# Patient Record
Sex: Male | Born: 1960 | Race: White | Hispanic: No | Smoking: Former smoker
Health system: Southern US, Community
[De-identification: ages and names within clinical notes are randomized; demographics above are authoritative.]

## PROBLEM LIST (undated history)

## (undated) VITALS — BP 104/87 | HR 87 | Temp 98.2°F | Resp 19 | Ht 70.0 in | Wt 239.0 lb

## (undated) VITALS — BP 162/94 | HR 86 | Temp 98.2°F | Resp 18 | Ht 70.0 in | Wt 275.0 lb

## (undated) DIAGNOSIS — I2510 Atherosclerotic heart disease of native coronary artery without angina pectoris: Secondary | ICD-10-CM

## (undated) DIAGNOSIS — I10 Essential (primary) hypertension: Secondary | ICD-10-CM

## (undated) DIAGNOSIS — M1990 Unspecified osteoarthritis, unspecified site: Secondary | ICD-10-CM

## (undated) DIAGNOSIS — E785 Hyperlipidemia, unspecified: Secondary | ICD-10-CM

## (undated) DIAGNOSIS — E119 Type 2 diabetes mellitus without complications: Secondary | ICD-10-CM

## (undated) DIAGNOSIS — F4310 Post-traumatic stress disorder, unspecified: Secondary | ICD-10-CM

## (undated) DIAGNOSIS — F17200 Nicotine dependence, unspecified, uncomplicated: Secondary | ICD-10-CM

## (undated) DIAGNOSIS — F329 Major depressive disorder, single episode, unspecified: Secondary | ICD-10-CM

## (undated) DIAGNOSIS — E1165 Type 2 diabetes mellitus with hyperglycemia: Secondary | ICD-10-CM

## (undated) DIAGNOSIS — K859 Acute pancreatitis without necrosis or infection, unspecified: Secondary | ICD-10-CM

## (undated) HISTORY — DX: Type 2 diabetes mellitus with hyperglycemia: E11.65

## (undated) HISTORY — DX: Major depressive disorder, single episode, unspecified: F32.9

## (undated) HISTORY — DX: Essential (primary) hypertension: I10

## (undated) HISTORY — DX: Nicotine dependence, unspecified, uncomplicated: F17.200

## (undated) HISTORY — DX: Hyperlipidemia, unspecified: E78.5

## (undated) HISTORY — PX: CHOLECYSTECTOMY: SHX55

## (undated) HISTORY — PX: WRIST FRACTURE SURGERY: SHX121

---
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
## 2015-08-03 ENCOUNTER — Emergency Department (HOSPITAL_COMMUNITY): Payer: Non-veteran care

## 2015-08-03 ENCOUNTER — Observation Stay (HOSPITAL_COMMUNITY): Payer: Non-veteran care

## 2015-08-03 ENCOUNTER — Encounter (HOSPITAL_COMMUNITY): Payer: Self-pay | Admitting: Oncology

## 2015-08-03 ENCOUNTER — Inpatient Hospital Stay (HOSPITAL_COMMUNITY)
Admission: EM | Admit: 2015-08-03 | Discharge: 2015-08-05 | DRG: 440 | Disposition: A | Discharge status: Home / Self Care | Payer: Non-veteran care | Attending: Internal Medicine | Admitting: Internal Medicine

## 2015-08-03 DIAGNOSIS — F4310 Post-traumatic stress disorder, unspecified: Secondary | ICD-10-CM | POA: Diagnosis present

## 2015-08-03 DIAGNOSIS — E669 Obesity, unspecified: Secondary | ICD-10-CM

## 2015-08-03 DIAGNOSIS — F17200 Nicotine dependence, unspecified, uncomplicated: Secondary | ICD-10-CM | POA: Diagnosis present

## 2015-08-03 DIAGNOSIS — M1990 Unspecified osteoarthritis, unspecified site: Secondary | ICD-10-CM | POA: Diagnosis present

## 2015-08-03 DIAGNOSIS — R9431 Abnormal electrocardiogram [ECG] [EKG]: Secondary | ICD-10-CM | POA: Diagnosis present

## 2015-08-03 DIAGNOSIS — E1169 Type 2 diabetes mellitus with other specified complication: Secondary | ICD-10-CM

## 2015-08-03 DIAGNOSIS — R112 Nausea with vomiting, unspecified: Secondary | ICD-10-CM | POA: Diagnosis present

## 2015-08-03 DIAGNOSIS — I10 Essential (primary) hypertension: Secondary | ICD-10-CM | POA: Diagnosis present

## 2015-08-03 DIAGNOSIS — I714 Abdominal aortic aneurysm, without rupture, unspecified: Secondary | ICD-10-CM | POA: Diagnosis present

## 2015-08-03 DIAGNOSIS — Z720 Tobacco use: Secondary | ICD-10-CM

## 2015-08-03 DIAGNOSIS — E785 Hyperlipidemia, unspecified: Secondary | ICD-10-CM | POA: Diagnosis present

## 2015-08-03 DIAGNOSIS — K859 Acute pancreatitis without necrosis or infection, unspecified: Secondary | ICD-10-CM | POA: Diagnosis present

## 2015-08-03 DIAGNOSIS — I513 Intracardiac thrombosis, not elsewhere classified: Secondary | ICD-10-CM | POA: Diagnosis present

## 2015-08-03 DIAGNOSIS — Z79899 Other long term (current) drug therapy: Secondary | ICD-10-CM | POA: Diagnosis not present

## 2015-08-03 DIAGNOSIS — E119 Type 2 diabetes mellitus without complications: Secondary | ICD-10-CM

## 2015-08-03 DIAGNOSIS — E1165 Type 2 diabetes mellitus with hyperglycemia: Secondary | ICD-10-CM | POA: Diagnosis present

## 2015-08-03 DIAGNOSIS — R0602 Shortness of breath: Secondary | ICD-10-CM

## 2015-08-03 HISTORY — DX: Essential (primary) hypertension: I10

## 2015-08-03 HISTORY — DX: Post-traumatic stress disorder, unspecified: F43.10

## 2015-08-03 HISTORY — DX: Unspecified osteoarthritis, unspecified site: M19.90

## 2015-08-03 HISTORY — DX: Type 2 diabetes mellitus without complications: E11.9

## 2015-08-03 LAB — COMPREHENSIVE METABOLIC PANEL
ALT: 32 U/L (ref 17–63)
AST: 21 U/L (ref 15–41)
Albumin: 4.3 g/dL (ref 3.5–5.0)
Alkaline Phosphatase: 82 U/L (ref 38–126)
Anion gap: 11 (ref 5–15)
BUN: 17 mg/dL (ref 6–20)
CALCIUM: 9.8 mg/dL (ref 8.9–10.3)
CO2: 25 mmol/L (ref 22–32)
CREATININE: 0.81 mg/dL (ref 0.61–1.24)
Chloride: 99 mmol/L — ABNORMAL LOW (ref 101–111)
GFR calc Af Amer: >60 mL/min (ref >60)
Glucose, Bld: 302 mg/dL — ABNORMAL HIGH (ref 65–99)
Potassium: 3.9 mmol/L (ref 3.5–5.1)
Sodium: 135 mmol/L (ref 135–145)
Total Bilirubin: 0.6 mg/dL (ref 0.3–1.2)
Total Protein: 8.1 g/dL (ref 6.5–8.1)

## 2015-08-03 LAB — GLUCOSE, CAPILLARY
GLUCOSE-CAPILLARY: 122 mg/dL — AB (ref 65–99)
GLUCOSE-CAPILLARY: 113 mg/dL — AB (ref 65–99)
GLUCOSE-CAPILLARY: 110 mg/dL — AB (ref 65–99)
Glucose-Capillary: 155 mg/dL — ABNORMAL HIGH (ref 65–99)
Glucose-Capillary: 97 mg/dL (ref 65–99)

## 2015-08-03 LAB — CBC WITH DIFFERENTIAL/PLATELET
BASOS ABS: 0 10*3/uL (ref 0.0–0.1)
Basophils Relative: 0 %
EOS ABS: 0.2 10*3/uL (ref 0.0–0.7)
EOS PCT: 1 %
HCT: 46.6 % (ref 39.0–52.0)
Hemoglobin: 16.1 g/dL (ref 13.0–17.0)
LYMPHS PCT: 25 %
Lymphs Abs: 4 10*3/uL (ref 0.7–4.0)
MCH: 30.1 pg (ref 26.0–34.0)
MCHC: 34.5 g/dL (ref 30.0–36.0)
MCV: 87.3 fL (ref 78.0–100.0)
MONO ABS: 1.2 10*3/uL — AB (ref 0.1–1.0)
Monocytes Relative: 8 %
Neutro Abs: 10.3 10*3/uL — ABNORMAL HIGH (ref 1.7–7.7)
Neutrophils Relative %: 66 %
Platelets: 234 10*3/uL (ref 150–400)
RBC: 5.34 MIL/uL (ref 4.22–5.81)
RDW: 13.6 % (ref 11.5–15.5)
WBC: 15.7 10*3/uL — AB (ref 4.0–10.5)

## 2015-08-03 LAB — URINALYSIS, ROUTINE W REFLEX MICROSCOPIC
BILIRUBIN URINE: NEGATIVE
KETONES UR: NEGATIVE mg/dL
Leukocytes, UA: NEGATIVE
Nitrite: NEGATIVE
PH: 5.5 (ref 5.0–8.0)
Protein, ur: 100 mg/dL — AB
Specific Gravity, Urine: 1.043 — ABNORMAL HIGH (ref 1.005–1.030)

## 2015-08-03 LAB — LIPID PANEL
CHOL/HDL RATIO: 5.7 ratio
CHOLESTEROL: 211 mg/dL — AB (ref 0–200)
HDL: 37 mg/dL — AB (ref >40)
LDL Cholesterol: 105 mg/dL — ABNORMAL HIGH (ref 0–99)
TRIGLYCERIDES: 343 mg/dL — AB (ref <150)
VLDL: 69 mg/dL — AB (ref 0–40)

## 2015-08-03 LAB — URINE MICROSCOPIC-ADD ON

## 2015-08-03 LAB — LIPASE, BLOOD: Lipase: 292 U/L — ABNORMAL HIGH (ref 11–51)

## 2015-08-03 LAB — TROPONIN I

## 2015-08-03 MED ORDER — ONDANSETRON HCL 4 MG/2ML IJ SOLN
4.0000 mg | Freq: Once | INTRAMUSCULAR | Status: AC
  Administered 2015-08-03: 4 mg via INTRAVENOUS
  Filled 2015-08-03: qty 2

## 2015-08-03 MED ORDER — INSULIN ASPART 100 UNIT/ML ~~LOC~~ SOLN
0.0000 [IU] | SUBCUTANEOUS | Status: DC
  Administered 2015-08-03: 1 [IU] via SUBCUTANEOUS
  Administered 2015-08-03: 2 [IU] via SUBCUTANEOUS
  Administered 2015-08-04 – 2015-08-05 (×2): 1 [IU] via SUBCUTANEOUS

## 2015-08-03 MED ORDER — ONDANSETRON HCL 4 MG/2ML IJ SOLN: 4.0000 mg | Freq: Three times a day (TID) | INTRAMUSCULAR | Status: AC | PRN

## 2015-08-03 MED ORDER — IOHEXOL 300 MG/ML  SOLN
100.0000 mL | Freq: Once | INTRAMUSCULAR | Status: AC | PRN
Start: 2015-08-03 — End: 2015-08-03
  Administered 2015-08-03: 100 mL via INTRAVENOUS

## 2015-08-03 MED ORDER — HYDROMORPHONE HCL 1 MG/ML IJ SOLN
1.0000 mg | Freq: Once | INTRAMUSCULAR | Status: AC
  Administered 2015-08-03: 1 mg via INTRAVENOUS
  Filled 2015-08-03: qty 1

## 2015-08-03 MED ORDER — SODIUM CHLORIDE 0.9 % IV BOLUS (SEPSIS)
1000.0000 mL | Freq: Once | INTRAVENOUS | Status: AC
  Administered 2015-08-03: 1000 mL via INTRAVENOUS

## 2015-08-03 MED ORDER — HYDROMORPHONE HCL 1 MG/ML IJ SOLN
1.0000 mg | INTRAMUSCULAR | Status: AC | PRN
  Administered 2015-08-03 – 2015-08-04 (×11): 1 mg via INTRAVENOUS
  Filled 2015-08-03 (×10): qty 1

## 2015-08-03 MED ORDER — ATORVASTATIN CALCIUM 40 MG PO TABS
40.0000 mg | ORAL_TABLET | Freq: Every day | ORAL | Status: DC
  Administered 2015-08-04: 40 mg via ORAL
  Filled 2015-08-03: qty 1

## 2015-08-03 MED ORDER — VENLAFAXINE HCL 75 MG PO TABS
75.0000 mg | ORAL_TABLET | Freq: Two times a day (BID) | ORAL | Status: DC
  Administered 2015-08-03 – 2015-08-05 (×4): 75 mg via ORAL
  Filled 2015-08-03 (×5): qty 1

## 2015-08-03 MED ORDER — NICOTINE 21 MG/24HR TD PT24
21.0000 mg | MEDICATED_PATCH | Freq: Every day | TRANSDERMAL | Status: DC
  Administered 2015-08-03 – 2015-08-05 (×3): 21 mg via TRANSDERMAL
  Filled 2015-08-03 (×3): qty 1

## 2015-08-03 MED ORDER — TRAZODONE HCL 50 MG PO TABS
200.0000 mg | ORAL_TABLET | Freq: Every day | ORAL | Status: DC
  Filled 2015-08-03: qty 4

## 2015-08-03 MED ORDER — ENOXAPARIN SODIUM 40 MG/0.4ML ~~LOC~~ SOLN
40.0000 mg | SUBCUTANEOUS | Status: DC
  Administered 2015-08-03 – 2015-08-05 (×3): 40 mg via SUBCUTANEOUS
  Filled 2015-08-03 (×4): qty 0.4

## 2015-08-03 MED ORDER — IOHEXOL 300 MG/ML  SOLN
50.0000 mL | Freq: Once | INTRAMUSCULAR | Status: AC | PRN
  Administered 2015-08-03: 50 mL via ORAL

## 2015-08-03 MED ORDER — HYDROMORPHONE HCL 1 MG/ML IJ SOLN
1.0000 mg | INTRAMUSCULAR | Status: DC | PRN
Start: 2015-08-03 — End: 2015-08-03
  Administered 2015-08-03: 1 mg via INTRAVENOUS
  Filled 2015-08-03 (×2): qty 1

## 2015-08-03 MED ORDER — SODIUM CHLORIDE 0.9 % IV SOLN
INTRAVENOUS | Status: DC
  Administered 2015-08-03 – 2015-08-04 (×4): via INTRAVENOUS

## 2015-08-03 MED ORDER — IPRATROPIUM-ALBUTEROL 0.5-2.5 (3) MG/3ML IN SOLN: 3.0000 mL | RESPIRATORY_TRACT | Status: DC | PRN

## 2015-08-03 MED ORDER — SODIUM CHLORIDE 0.9 % IV SOLN: INTRAVENOUS | Status: AC

## 2015-08-03 MED ORDER — ASPIRIN EC 81 MG PO TBEC
81.0000 mg | DELAYED_RELEASE_TABLET | Freq: Every day | ORAL | Status: DC
  Administered 2015-08-04 – 2015-08-05 (×2): 81 mg via ORAL
  Filled 2015-08-03 (×3): qty 1

## 2015-08-03 NOTE — Progress Notes (Signed)
Patient seen and examined this morning, agree with H&P. He is a 55 yo M who is admitted with acute pancreatitis. He continues to have abdominal pain this morning, mid epigastric to left upper quadrant, irradiating to the back.    Acute pancreatitis - Etiology of his pancreatitis is not entirely clear, he is not drinking alcohol, not on any new medications or supplements. He has a history of cholecystectomy.  - lipid panel pending - NPO, IVF, supportive management - patient appears anxious to go home as soon as possible  Hyperglycemia - history of DM however patient states that he doesn't have DM - A1C pending - meanwhile maintain on SSI  HTN - normotensive, getting narcotics for pain control, hold home medications, resume as indicated  AAA with mild intramural thrombus - incidentally, CT abdomen pelvis with dilatation of the infrarenal abdominal aorta to 3.8 cm in AP dimension with mild mural thrombus without evidence of luminal narrowing.  - start patient on Aspirin and Statin, will need follow up with vascular surgery as an outpatient for routine monitoring. - discussed with Dr. Darrick PennaFields over the phone, no role for anticoagulation  Tobacco abuse - counseled for cessation - nicotine patch  PTSD (post-traumatic stress disorder) - Patient currently on trazodone and venlafaxine   Costin M. Elvera LennoxGherghe, MD [facility] [phone]

## 2015-08-03 NOTE — ED Provider Notes (Signed)
CSN: [id number]     Arrival date & time 08/03/15  0229 History  By signing my name below, I, Jacob Brady, attest that this documentation has been prepared under the direction and in the presence of [name], MD. Electronically Signed: [name], ED Scribe. 08/03/2015. 3:01 AM     Chief Complaint  Patient presents with  . Abdominal Pain   The history is provided by the patient. No language interpreter was used.   Brought in by EMS, Jacob Brady is a 55 y.o. male with history of gallstones s/p cholecystectomy and pancreatitis who presents to the Emergency Department complaining of new, worsening, throbbing/stinging/aching, left upper abdominal pain with gradual onset 10 days ago The pain radiates to the back and is worse with eating. Associated symptoms include decreased appetite and nausea. He notes some dry heaving closer to the onset of symptoms but denies vomiting. Pt denies chest pain. He has no history of ulcers. Past Medical History  Diagnosis Date  . Hypertension   . Diabetes mellitus without complication (HCC)   . PTSD (post-traumatic stress disorder)   . Arthritis    Past Surgical History  Procedure Laterality Date  . Cholecystectomy    . Wrist fracture surgery Right    History reviewed. No pertinent family history. Social History  Substance Use Topics  . Smoking status: Current Every Day Smoker  . Smokeless tobacco: Never Used  . Alcohol Use: No    Review of Systems  Constitutional: Positive for appetite change. Negative for fever and chills.  Cardiovascular: Negative for chest pain.  Gastrointestinal: Positive for nausea and abdominal pain. Negative for vomiting, hematochezia and hematemesis.  All other systems reviewed and are negative.  Allergies  Review of patient's allergies indicates no known allergies.  Home Medications   Prior to Admission medications   Medication Sig Start Date End Date Taking? Authorizing Provider   amLODipine-benazepril (LOTREL) 10-20 MG capsule Take 1 capsule by mouth daily.   Yes Historical Provider, MD  hydrochlorothiazide (HYDRODIURIL) 12.5 MG tablet Take 12.5 mg by mouth daily.   Yes Historical Provider, MD  ibuprofen (ADVIL,MOTRIN) 200 MG tablet Take 200 mg by mouth every 6 (six) hours as needed.   Yes Historical Provider, MD  traZODone (DESYREL) 100 MG tablet Take 200 mg by mouth at bedtime.   Yes Historical Provider, MD  venlafaxine (EFFEXOR) 75 MG tablet Take 75 mg by mouth 2 (two) times daily.   Yes Historical Provider, MD   BP 149/90 mmHg  Pulse 99  Temp(Src) 98.2 F (36.8 C) (Oral)  Resp 20  Ht 5\' 11"  (1.803 m)  Wt 210 lb (95.255 kg)  BMI 29.30 kg/m2  SpO2 96% Physical Exam  Constitutional: He is oriented to person, place, and time. He appears well-developed and well-nourished. No distress.  HENT:  Head: Normocephalic and atraumatic.  Right Ear: Hearing normal.  Left Ear: Hearing normal.  Nose: Nose normal.  Mouth/Throat: Oropharynx is clear and moist and mucous membranes are normal.  Eyes: Conjunctivae and EOM are normal. Pupils are equal, round, and reactive to light.  Neck: Normal range of motion. Neck supple.  Cardiovascular: Regular rhythm, S1 normal and S2 normal.  Exam reveals no gallop and no friction rub.   No murmur heard. Pulmonary/Chest: Effort normal and breath sounds normal. No respiratory distress. He exhibits no tenderness.  Abdominal: Soft. Normal appearance and bowel sounds are normal. There is no hepatosplenomegaly. There is no tenderness. There is no rebound, no guarding, no tenderness at McBurney's point  and negative Murphy's sign. No hernia.  Musculoskeletal: Normal range of motion.  Neurological: He is alert and oriented to person, place, and time. He has normal strength. No cranial nerve deficit or sensory deficit. Coordination normal. GCS eye subscore is 4. GCS verbal subscore is 5. GCS motor subscore is 6.  Skin: Skin is warm, dry and  intact. No rash noted. No cyanosis.  Psychiatric: He has a normal mood and affect. His speech is normal and behavior is normal. Thought content normal.  Nursing note and vitals reviewed.   ED Course  Procedures (including critical care time) DIAGNOSTIC STUDIES: Oxygen Saturation is 96% on RA,  normal by my interpretation.    COORDINATION OF CARE: 2:38 AM Discussed treatment plan which includes lab work, EKG,  CT A/P with contrast, Dilaudid, Zofran, and IVF with pt at bedside and pt agreed to plan.  Labs Review Labs Reviewed  CBC WITH DIFFERENTIAL/PLATELET - Abnormal; Notable for the following:    WBC 15.7 (*)    Neutro Abs 10.3 (*)    Monocytes Absolute 1.2 (*)    All other components within normal limits  COMPREHENSIVE METABOLIC PANEL - Abnormal; Notable for the following:    Chloride 99 (*)    Glucose, Bld 302 (*)    All other components within normal limits  LIPASE, BLOOD - Abnormal; Notable for the following:    Lipase 292 (*)    All other components within normal limits  TROPONIN I  URINALYSIS, ROUTINE W REFLEX MICROSCOPIC (NOT AT [facility])  URINE MICROSCOPIC-ADD ON    Imaging Review Ct Abdomen Pelvis W Contrast  08/03/2015  CLINICAL DATA:  Acute onset of worsening left upper quadrant abdominal pain, radiating to the back. Leukocytosis. Decreased appetite and nausea. Initial encounter. EXAM: CT ABDOMEN AND PELVIS WITH CONTRAST TECHNIQUE: Multidetector CT imaging of the abdomen and pelvis was performed using the standard protocol following bolus administration of intravenous contrast. CONTRAST:  100mL OMNIPAQUE IOHEXOL 300 MG/ML  SOLN COMPARISON:  None. FINDINGS: The visualized lung bases are clear. Mild soft tissue inflammation is noted about the distal body and tail of the pancreas, compatible with acute pancreatitis. Trace associated fluid is noted. There is no evidence of devascularization or pseudocyst formation at this time. The liver and spleen are unremarkable in appearance.  The patient is status post cholecystectomy, with clips noted at the gallbladder fossa. The adrenal glands are unremarkable. Mild nonspecific perinephric stranding is noted bilaterally. The kidneys are otherwise unremarkable. There is no evidence of hydronephrosis. No renal or ureteral stones are seen. The small bowel is unremarkable in appearance. The stomach is within normal limits. No acute vascular abnormalities are seen. There is dilatation of the infrarenal abdominal aorta to 3.8 cm in AP dimension. Mild mural thrombus is noted along the infrarenal abdominal aorta, without evidence of luminal narrowing. Scattered calcification is seen along the abdominal aorta and its branches. The appendix is normal in caliber, without evidence of appendicitis. Scattered diverticulosis is noted along the descending and proximal sigmoid colon, without evidence of diverticulitis. The bladder is mildly distended and grossly unremarkable. The prostate remains normal in size. No inguinal lymphadenopathy is seen. No acute osseous abnormalities are identified. IMPRESSION: 1. Mild acute pancreatitis, with mild soft tissue inflammation about the distal body and tail of the pancreas. Trace associated fluid noted. No evidence of devascularization or pseudocyst formation at this time. 2. Dilatation of the infrarenal abdominal aorta to 3.8 cm in AP dimension. Mild mural thrombus along the abdominal aorta, without evidence  of luminal narrowing. Scattered calcification along the abdominal aorta and its branches. Recommend followup by ultrasound in 2 years. This recommendation follows ACR consensus guidelines: White Paper of the ACR Incidental Findings Committee II on Vascular Findings. J Am Coll Radiol 2013; 10:789-794. 3. Scattered diverticulosis along the descending and proximal sigmoid colon, without evidence of diverticulitis. Electronically Signed   By: Roanna Raider M.D.   On: 08/03/2015 04:03   I have personally reviewed and  evaluated these images and lab results as part of my medical decision-making.   EKG Interpretation   Date/Time:  Sunday August 03 2015 02:54:55 EST Ventricular Rate:  102 PR Interval:  164 QRS Duration: 109 QT Interval:  360 QTC Calculation: 469 R Axis:   -49 Text Interpretation:  Sinus tachycardia Incomplete RBBB and LAFB Abnormal  R-wave progression, early transition Baseline wander in lead(s) V2 No  previous tracing Confirmed by Efton Thomley  MD, Chloey Ricard ([id number]) on 08/03/2015  3:01:31 AM      MDM   Final diagnoses:  Acute pancreatitis, unspecified pancreatitis type    Patient presents to the ER for evaluation of left upper quadrant abdominal pain. Patient reports that he has been having intermittent pains for the last 10 days, however tonight the pain became constant and severe. He has not had any fever. There is no nausea, vomiting, diarrhea or constipation associated with symptoms. Patient reports that he did have a previous history of gallbladder disease. He has had his gallbladder out, describes having to stay in the hospital for several days before the surgery could be performed because he had "an infection". He also thinks that his pancreas might of been involved at that time.  Workup today reveals elevated lipase consistent with acute pancreatitis. CT scan confirmed pancreatitis without complicating features. Etiology of the pancreatitis is unclear at this time. He is not alcohol user. No signs of ductal dilatation on CT scan. Patient has required multiple doses of analgesia here in the ER and is still uncomfortable. Will admit for pain control and further treatment/evaluation.  I personally performed the services described in this documentation, which was scribed in my presence. The recorded information has been reviewed and is accurate.    [name], MD 08/03/15 [phone]

## 2015-08-03 NOTE — H&P (Addendum)
[facility] History and Physical  Jacob Brady WJX:[id number] DOB: 10-Jan-1961 DOA: 08/03/2015  Referring physician: ED PCP: No primary care provider on file.   Chief Complaint: Left upper quadrant abdominal pain  HPI:  Mr. Jacob Brady is a 55 year old male with past medical history significant for diabetes mellitus type 2, PTSD, hypertension; presents for at least one week history of left upper quadrant abdominal pain. Symptoms started on 07/25/2015, and describes them as a achy stingy deep pain which would radiate to his back.  Symptoms initially self resolved, but came back 2 days later. He tried Tylenol and ibuprofen without relief. He notes associated symptoms of shortness of breath, decreased appetite, and dry heaves. Only reports dry heaves the first day. Pain worsened  by taking deep breaths in. Denies any new medications or dosage changes. He denies any use of alcohol and reports being down to smoking only a couple cigarettes per day. Upon admission into the emergency department, the patient is reevaluated and seen have elevated lipase of 292. CT scan of the abdomen shows mild inflammation around the pancreas and the incidental finding of a abdominal aortic aneurysm that is 3.8 cm and AP diameter.   Review of Systems  Constitutional: Negative for fever and chills.       Decreased appetite  HENT: Negative for ear pain and tinnitus.   Eyes: Negative for photophobia and pain.  Respiratory: Positive for shortness of breath. Negative for cough.   Cardiovascular: Negative for chest pain and palpitations.  Gastrointestinal: Positive for nausea and abdominal pain. Negative for diarrhea, constipation and blood in stool.  Genitourinary: Negative for urgency and frequency.  Musculoskeletal: Positive for joint pain.  Skin: Negative for itching and rash.  Neurological: Negative for focal weakness and seizures.  Endo/Heme/Allergies: Negative for environmental allergies and polydipsia.   Psychiatric/Behavioral: Negative for suicidal ideas and hallucinations.      Past Medical History  Diagnosis Date  . Hypertension   . Diabetes mellitus without complication (HCC)   . PTSD (post-traumatic stress disorder)   . Arthritis      Past Surgical History  Procedure Laterality Date  . Cholecystectomy    . Wrist fracture surgery Right       Social History:  reports that he has been smoking.  He has never used smokeless tobacco. He reports that he does not drink alcohol or use illicit drugs. Where does patient live--home Can patient participate in ADLs? Yes  No Known Allergies  History reviewed. No pertinent family history.     Prior to Admission medications   Medication Sig Start Date End Date Taking? Authorizing Provider  amLODipine-benazepril (LOTREL) 10-20 MG capsule Take 1 capsule by mouth daily.   Yes Historical Provider, MD  hydrochlorothiazide (HYDRODIURIL) 12.5 MG tablet Take 12.5 mg by mouth daily.   Yes Historical Provider, MD  ibuprofen (ADVIL,MOTRIN) 200 MG tablet Take 200 mg by mouth every 6 (six) hours as needed.   Yes Historical Provider, MD  traZODone (DESYREL) 100 MG tablet Take 200 mg by mouth at bedtime.   Yes Historical Provider, MD  venlafaxine (EFFEXOR) 75 MG tablet Take 75 mg by mouth 2 (two) times daily.   Yes Historical Provider, MD     Physical Exam: Filed Vitals:   08/03/15 0236 08/03/15 0245  BP:  149/90  Pulse:  99  Temp:  98.2 F (36.8 C)  TempSrc:  Oral  Resp:  20  Height:  5\' 11"  (1.803 m)  Weight:  95.255 kg (210 lb)  SpO2: 96%  96%     Constitutional: Vital signs reviewed. Patient is a obese male who appears not able to get comfortable sitting on the hospital bed. Alert and oriented x3.  Head: Normocephalic and atraumatic  Ear: TM normal bilaterally  Mouth: no erythema or exudates, MMM  Eyes: PERRL, EOMI, conjunctivae normal, No scleral icterus.  Neck: Supple, Trachea midline normal ROM, No JVD, mass, thyromegaly, or  carotid bruit present.  Cardiovascular: RRR, S1 normal, S2 normal, no MRG, pulses symmetric and intact bilaterally  Pulmonary/Chest: CTAB, but decreased overall air movement. Abdominal: Soft. Mild tenderness to palpation along the left upper quadrant, non-distended, bowel sounds are normal, no masses, organomegaly, or guarding present.  GU: no CVA tenderness Musculoskeletal: No joint deformities, erythema, or stiffness, ROM full and no nontender Ext: no edema and no cyanosis, pulses palpable bilaterally (DP and PT)  Hematology: no cervical, inginal, or axillary adenopathy.  Neurological: A&O x3, Strenght is normal and symmetric bilaterally, cranial nerve II-XII are grossly intact, no focal motor deficit, sensory intact to light touch bilaterally.  Skin: Warm, dry and intact. No rash, cyanosis, or clubbing.  Psychiatric: Normal mood and affect. speech and behavior is normal. Judgment and thought content normal. Cognition and memory are normal.      Data Review   Micro Results No results found for this or any previous visit (from the past 240 hour(s)).  Radiology Reports Ct Abdomen Pelvis W Contrast  08/03/2015  CLINICAL DATA:  Acute onset of worsening left upper quadrant abdominal pain, radiating to the back. Leukocytosis. Decreased appetite and nausea. Initial encounter. EXAM: CT ABDOMEN AND PELVIS WITH CONTRAST TECHNIQUE: Multidetector CT imaging of the abdomen and pelvis was performed using the standard protocol following bolus administration of intravenous contrast. CONTRAST:  100mL OMNIPAQUE IOHEXOL 300 MG/ML  SOLN COMPARISON:  None. FINDINGS: The visualized lung bases are clear. Mild soft tissue inflammation is noted about the distal body and tail of the pancreas, compatible with acute pancreatitis. Trace associated fluid is noted. There is no evidence of devascularization or pseudocyst formation at this time. The liver and spleen are unremarkable in appearance. The patient is status post  cholecystectomy, with clips noted at the gallbladder fossa. The adrenal glands are unremarkable. Mild nonspecific perinephric stranding is noted bilaterally. The kidneys are otherwise unremarkable. There is no evidence of hydronephrosis. No renal or ureteral stones are seen. The small bowel is unremarkable in appearance. The stomach is within normal limits. No acute vascular abnormalities are seen. There is dilatation of the infrarenal abdominal aorta to 3.8 cm in AP dimension. Mild mural thrombus is noted along the infrarenal abdominal aorta, without evidence of luminal narrowing. Scattered calcification is seen along the abdominal aorta and its branches. The appendix is normal in caliber, without evidence of appendicitis. Scattered diverticulosis is noted along the descending and proximal sigmoid colon, without evidence of diverticulitis. The bladder is mildly distended and grossly unremarkable. The prostate remains normal in size. No inguinal lymphadenopathy is seen. No acute osseous abnormalities are identified. IMPRESSION: 1. Mild acute pancreatitis, with mild soft tissue inflammation about the distal body and tail of the pancreas. Trace associated fluid noted. No evidence of devascularization or pseudocyst formation at this time. 2. Dilatation of the infrarenal abdominal aorta to 3.8 cm in AP dimension. Mild mural thrombus along the abdominal aorta, without evidence of luminal narrowing. Scattered calcification along the abdominal aorta and its branches. Recommend followup by ultrasound in 2 years. This recommendation follows ACR consensus guidelines: White Paper of the ACR Incidental  Findings Committee II on Vascular Findings. J Am Coll Radiol 2013; 10:789-794. 3. Scattered diverticulosis along the descending and proximal sigmoid colon, without evidence of diverticulitis. Electronically Signed   By: Roanna Raider M.D.   On: 08/03/2015 04:03     CBC  Recent Labs Lab 08/03/15 0300  WBC 15.7*  HGB 16.1   HCT 46.6  PLT 234  MCV 87.3  MCH 30.1  MCHC 34.5  RDW 13.6  LYMPHSABS 4.0  MONOABS 1.2*  EOSABS 0.2  BASOSABS 0.0    Chemistries   Recent Labs Lab 08/03/15 0300  NA 135  K 3.9  CL 99*  CO2 25  GLUCOSE 302*  BUN 17  CREATININE 0.81  CALCIUM 9.8  AST 21  ALT 32  ALKPHOS 82  BILITOT 0.6   ------------------------------------------------------------------------------------------------------------------ estimated creatinine clearance is 125.7 mL/min (by C-G formula based on Cr of 0.81). ------------------------------------------------------------------------------------------------------------------ No results for input(s): HGBA1C in the last 72 hours. ------------------------------------------------------------------------------------------------------------------ No results for input(s): CHOL, HDL, LDLCALC, TRIG, CHOLHDL, LDLDIRECT in the last 72 hours. ------------------------------------------------------------------------------------------------------------------ No results for input(s): TSH, T4TOTAL, T3FREE, THYROIDAB in the last 72 hours.  Invalid input(s): FREET3 ------------------------------------------------------------------------------------------------------------------ No results for input(s): VITAMINB12, FOLATE, FERRITIN, TIBC, IRON, RETICCTPCT in the last 72 hours.  Coagulation profile No results for input(s): INR, PROTIME in the last 168 hours.  No results for input(s): DDIMER in the last 72 hours.  Cardiac Enzymes  Recent Labs Lab 08/03/15 0300  TROPONINI <0.03   ------------------------------------------------------------------------------------------------------------------ Invalid input(s): POCBNP   CBG: No results for input(s): GLUCAP in the last 168 hours.     EKG: Independently reviewed. Sinus tachycardia   Assessment/Plan  Pancreatitis: Acute. Patient with left upper quadrant pain radiating to back. WBC 15.7, lipase elevated  at 292, CT showing mild pancreatitis. Possible causes include medications vs hypertriglyceridemia with obesity versus other. - Admit to MedSurg. - IV fluids of normal saline at per hour - IV Dilaudid for pain - Check lipid panel - Patient not thought to have acute infection at this time  Essential hypertension - Held Lotrel and hydrochlorothiazide    Diabetes mellitus type 2 in obese [facility]): Patient reports blood sugars are between 130s and 190s. Acutely, blood glucose 300 on admission - Hemoglobin A1c - CBGs every 4 hours with sensitive sliding scale insulin  PTSD (post-traumatic stress disorder) - Patient currently on trazodone and venlafaxine  Sinus tachycardia: Secondary to the presenting problem  Abdominal aortic aneurysm: Incidental finding on CT of the abdomen noted to be 3.8 cm and AP diameter. Recommended follow-up ultrasound in 2 years. Note there was note of mild intramural thrombus.  Tobacco abuse - nicotine patch   Lovenox DVT prophylaxis Code Status:   full Family Communication: bedside Disposition Plan: admit   Total time spent 55 minutes.Greater than 50% of this time was spent in counseling, explanation of diagnosis, planning of further management, and coordination of care  Clydie Braun [facility] Pager [phone]  If 7PM-7AM, please contact night-coverage [email or web address] Password [facility] 08/03/2015, 5:33 AM

## 2015-08-03 NOTE — ED Notes (Signed)
He is in no distress and denies nausea.  Pain med given.  Will transport shortly.

## 2015-08-03 NOTE — Progress Notes (Signed)
Chaplain met Jacob Jacob Brady while he was standing at the door of the ED exam room. He was eager for conversation and to be heard. Chaplain and Jacob Brady talked briefly about his wish to just talk to someone about things other than his medical problems. Chaplain encouraged him to have a [place] put in the system when he is placed in a hospital room. Jacob Brady did not wish to talk presently because he felt pending tests may interfere with the conversation he wished to have.  Jacob. Brady stated that he was having problems with the medical order that he not be able to drink any liquids prior to upcoming tests. The chaplain guided him briefly through some relaxation and focusing exercises to help temporarily keep his mind from fixating on wanting to drink water or other liquids.   If Jacob Brady wishes further spiritual and emotional care please place a [facility] in the [facility] system once he is assigned a hospital room.  Sallee Lange. Bruna Dills, DMin, MDiv, MA Chaplain

## 2015-08-03 NOTE — ED Notes (Signed)
Patient transported to CT 

## 2015-08-03 NOTE — ED Notes (Signed)
Pt transported from home with c/o LUQ pain x 10 days. Denies N/V/D. Ambulatory on arrival

## 2015-08-04 LAB — COMPREHENSIVE METABOLIC PANEL
ALBUMIN: 3.8 g/dL (ref 3.5–5.0)
ALK PHOS: 74 U/L (ref 38–126)
ALT: 30 U/L (ref 17–63)
ANION GAP: 9 (ref 5–15)
AST: 25 U/L (ref 15–41)
BILIRUBIN TOTAL: 1 mg/dL (ref 0.3–1.2)
BUN: 19 mg/dL (ref 6–20)
CO2: 25 mmol/L (ref 22–32)
Calcium: 8.9 mg/dL (ref 8.9–10.3)
Chloride: 101 mmol/L (ref 101–111)
Creatinine, Ser: 0.78 mg/dL (ref 0.61–1.24)
GFR calc Af Amer: >60 mL/min (ref >60)
GFR calc non Af Amer: >60 mL/min (ref >60)
GLUCOSE: 128 mg/dL — AB (ref 65–99)
POTASSIUM: 4.1 mmol/L (ref 3.5–5.1)
SODIUM: 135 mmol/L (ref 135–145)
TOTAL PROTEIN: 7.4 g/dL (ref 6.5–8.1)

## 2015-08-04 LAB — CBC
HEMATOCRIT: 41.5 % (ref 39.0–52.0)
HEMOGLOBIN: 13.6 g/dL (ref 13.0–17.0)
MCH: 29.5 pg (ref 26.0–34.0)
MCHC: 32.8 g/dL (ref 30.0–36.0)
MCV: 90 fL (ref 78.0–100.0)
Platelets: 226 10*3/uL (ref 150–400)
RBC: 4.61 MIL/uL (ref 4.22–5.81)
RDW: 13.9 % (ref 11.5–15.5)
WBC: 12.7 10*3/uL — ABNORMAL HIGH (ref 4.0–10.5)

## 2015-08-04 LAB — GLUCOSE, CAPILLARY
GLUCOSE-CAPILLARY: 100 mg/dL — AB (ref 65–99)
GLUCOSE-CAPILLARY: 142 mg/dL — AB (ref 65–99)
GLUCOSE-CAPILLARY: 88 mg/dL (ref 65–99)
GLUCOSE-CAPILLARY: 133 mg/dL — AB (ref 65–99)
Glucose-Capillary: 118 mg/dL — ABNORMAL HIGH (ref 65–99)
Glucose-Capillary: 120 mg/dL — ABNORMAL HIGH (ref 65–99)

## 2015-08-04 LAB — HEMOGLOBIN A1C
Hgb A1c MFr Bld: 8.2 % — ABNORMAL HIGH (ref 4.8–5.6)
Mean Plasma Glucose: 189 mg/dL

## 2015-08-04 LAB — MAGNESIUM: Magnesium: 1.6 mg/dL — ABNORMAL LOW (ref 1.7–2.4)

## 2015-08-04 LAB — LIPASE, BLOOD: Lipase: 93 U/L — ABNORMAL HIGH (ref 11–51)

## 2015-08-04 MED ORDER — SODIUM CHLORIDE 0.9 % IV SOLN: INTRAVENOUS | Status: AC

## 2015-08-04 MED ORDER — HYDROMORPHONE HCL 1 MG/ML IJ SOLN
INTRAMUSCULAR | Status: AC
  Administered 2015-08-04: 1 mg via INTRAVENOUS
  Filled 2015-08-04: qty 1

## 2015-08-04 MED ORDER — HYDROMORPHONE HCL 1 MG/ML IJ SOLN
1.0000 mg | INTRAMUSCULAR | Status: DC | PRN
  Administered 2015-08-04 – 2015-08-05 (×5): 1 mg via INTRAVENOUS
  Filled 2015-08-04 (×4): qty 1

## 2015-08-04 NOTE — Progress Notes (Signed)
Patient received 1 unit of Novolog insulin after 1700 CBG check of 142.  At 1855, patient felt shaky and "not right." CBG was checked and it was 88.  Patient states "that's low for me." Patient was given apple juice.  After 15 minutes, patient stated he felt much better.  Night RN at bedside during this time and will monitor CBG closely.

## 2015-08-04 NOTE — Progress Notes (Signed)
[facility] PROGRESS NOTE  Jacob Brady ZOX:[id number] DOB: 17-Oct-1960 DOA: 08/03/2015 PCP: No primary care provider on file.  Assessment/Plan: 55 y/o male with PMH of HTN, HPL, DM (not on meds), PTSD presented with abdominal pains, nausea, vomiting -admitted with acute pancreatitis   1. Acute pancreatitis Etiology of his pancreatitis is not entirely clear, he is not drinking alcohol, not on any new medications or supplements. He has a history of cholecystectomy. CT abd: Mild acute pancreatitis, with mild soft tissue inflammation aboutthe distal body and tail of the pancreas. Trace associated fluid noted. No evidence of devascularization or pseudocyst formation at this time. -? Triggered by diuretic. Patient is improving op NPO, IVF, supportive management. Lipase is tending down. Will start diet, advance as tolerated. D/w patient, recommended to f/u with GI for EUS -monitor 24-48 hrs   2. DM. Ha14c-8.6. Patient states that he doesn't take meds. D/w patient about DM management. He wants to f/u with PCP to start the treatment  3. HTN normotensive, getting narcotics for pain control, hold home medications, resume as indicated 4. AAA with mild intramural thrombus incidentally, CT abdomen pelvis with dilatation of the infrarenal abdominal aorta to 3.8 cm in AP dimension with mild mural thrombus without evidence of luminal narrowing.  - start patient on Aspirin and Statin, recommended to  follow up with vascular surgery as an outpatient for routine monitoring. -Hospitalist  discussed with Dr. Darrick Penna over the phone, no role for anticoagulation 5.Tobacco abuse counseled for cessation,  nicotine patch 6. PTSD (post-traumatic stress disorder) Patient currently on trazodone and venlafaxine   Code Status: full Family Communication: d/w patient (indicate person spoken with, relationship, and if by phone, the number) Disposition Plan: home in AM     Consultants:  none  Procedures:  none  Antibiotics:  none (indicate start date, and stop date if known)  HPI/Subjective: Alert. No distress   Objective: Filed Vitals:   08/03/15 2125 08/04/15 0409  BP: 117/70 136/84  Pulse: 78 86  Temp: 98.3 F (36.8 C) 97.8 F (36.6 C)  Resp: 20 18    Intake/Output Summary (Last 24 hours) at 08/04/15 1251 Last data filed at 08/04/15 0900  Gross per 24 hour  Intake 2812.5 ml  Output      0 ml  Net 2812.5 ml   Filed Weights   08/03/15 0245  Weight: 95.255 kg (210 lb)    Exam:   General:  No distress   Cardiovascular: s1,s2 rrr  Respiratory: CTA BL  Abdomen: soft, nt, nd   Musculoskeletal: no leg edema    Data Reviewed: Basic Metabolic Panel:  Recent Labs Lab 08/03/15 0300 08/04/15 0535  NA 135 135  K 3.9 4.1  CL 99* 101  CO2 25 25  GLUCOSE 302* 128*  BUN 17 19  CREATININE 0.81 0.78  CALCIUM 9.8 8.9  MG  --  1.6*   Liver Function Tests:  Recent Labs Lab 08/03/15 0300 08/04/15 0535  AST 21 25  ALT 32 30  ALKPHOS 82 74  BILITOT 0.6 1.0  PROT 8.1 7.4  ALBUMIN 4.3 3.8    Recent Labs Lab 08/03/15 0300 08/04/15 0535  LIPASE 292* 93*   No results for input(s): AMMONIA in the last 168 hours. CBC:  Recent Labs Lab 08/03/15 0300 08/04/15 0535  WBC 15.7* 12.7*  NEUTROABS 10.3*  --   HGB 16.1 13.6  HCT 46.6 41.5  MCV 87.3 90.0  PLT 234 226   Cardiac Enzymes:  Recent Labs Lab 08/03/15 0300  TROPONINI <0.03   BNP (last 3 results) No results for input(s): BNP in the last 8760 hours.  ProBNP (last 3 results) No results for input(s): PROBNP in the last 8760 hours.  CBG:  Recent Labs Lab 08/03/15 1944 08/03/15 2345 08/04/15 0405 08/04/15 0756 08/04/15 1207  GLUCAP 110* 97 118* 120* 100*    No results found for this or any previous visit (from the past 240 hour(s)).   Studies: Ct Abdomen Pelvis W Contrast  08/03/2015  CLINICAL DATA:  Acute onset of worsening left upper  quadrant abdominal pain, radiating to the back. Leukocytosis. Decreased appetite and nausea. Initial encounter. EXAM: CT ABDOMEN AND PELVIS WITH CONTRAST TECHNIQUE: Multidetector CT imaging of the abdomen and pelvis was performed using the standard protocol following bolus administration of intravenous contrast. CONTRAST:  100mL OMNIPAQUE IOHEXOL 300 MG/ML  SOLN COMPARISON:  None. FINDINGS: The visualized lung bases are clear. Mild soft tissue inflammation is noted about the distal body and tail of the pancreas, compatible with acute pancreatitis. Trace associated fluid is noted. There is no evidence of devascularization or pseudocyst formation at this time. The liver and spleen are unremarkable in appearance. The patient is status post cholecystectomy, with clips noted at the gallbladder fossa. The adrenal glands are unremarkable. Mild nonspecific perinephric stranding is noted bilaterally. The kidneys are otherwise unremarkable. There is no evidence of hydronephrosis. No renal or ureteral stones are seen. The small bowel is unremarkable in appearance. The stomach is within normal limits. No acute vascular abnormalities are seen. There is dilatation of the infrarenal abdominal aorta to 3.8 cm in AP dimension. Mild mural thrombus is noted along the infrarenal abdominal aorta, without evidence of luminal narrowing. Scattered calcification is seen along the abdominal aorta and its branches. The appendix is normal in caliber, without evidence of appendicitis. Scattered diverticulosis is noted along the descending and proximal sigmoid colon, without evidence of diverticulitis. The bladder is mildly distended and grossly unremarkable. The prostate remains normal in size. No inguinal lymphadenopathy is seen. No acute osseous abnormalities are identified. IMPRESSION: 1. Mild acute pancreatitis, with mild soft tissue inflammation about the distal body and tail of the pancreas. Trace associated fluid noted. No evidence of  devascularization or pseudocyst formation at this time. 2. Dilatation of the infrarenal abdominal aorta to 3.8 cm in AP dimension. Mild mural thrombus along the abdominal aorta, without evidence of luminal narrowing. Scattered calcification along the abdominal aorta and its branches. Recommend followup by ultrasound in 2 years. This recommendation follows ACR consensus guidelines: White Paper of the ACR Incidental Findings Committee II on Vascular Findings. J Am Coll Radiol 2013; 10:789-794. 3. Scattered diverticulosis along the descending and proximal sigmoid colon, without evidence of diverticulitis. Electronically Signed   By: Roanna RaiderJeffery  Chang M.D.   On: 08/03/2015 04:03   Portable Chest 1 View  08/03/2015  CLINICAL DATA:  55 year old male with shortness of breath currently admitted with pancreatitis EXAM: PORTABLE CHEST 1 VIEW COMPARISON:  CT scan abdomen/ pelvis earlier today FINDINGS: The lungs are clear and negative for focal airspace consolidation, pulmonary edema or suspicious pulmonary nodule. No pleural effusion or pneumothorax. Cardiac and mediastinal contours are within normal limits. No acute fracture or lytic or blastic osseous lesions. The visualized upper abdominal bowel gas pattern is unremarkable. IMPRESSION: No active cardiopulmonary disease. Electronically Signed   By: Malachy MoanHeath  McCullough M.D.   On: 08/03/2015 08:42    Scheduled Meds: . aspirin EC  81 mg Oral Daily  . atorvastatin  40 mg Oral  q1800  . enoxaparin (LOVENOX) injection  40 mg Subcutaneous Q24H  . insulin aspart  0-9 Units Subcutaneous 6 times per day  . nicotine  21 mg Transdermal Daily  . traZODone  200 mg Oral QHS  . venlafaxine  75 mg Oral BID   Continuous Infusions: . sodium chloride 125 mL/hr at 08/04/15 1610    Principal Problem:   Pancreatitis Active Problems:   Abnormal EKG   Essential hypertension   Diabetes mellitus type 2 in obese [facility])   PTSD (post-traumatic stress disorder)   Abdominal aortic  aneurysm (AAA) (HCC)   Acute pancreatitis    Time spent: >35 minutes     Esperanza Sheets  [facility] Pager [phone]. If 7PM-7AM, please contact night-coverage at [email or web address], password [facility] 08/04/2015, 12:51 PM  LOS: 1 day

## 2015-08-05 LAB — GLUCOSE, CAPILLARY
GLUCOSE-CAPILLARY: 87 mg/dL (ref 65–99)
Glucose-Capillary: 94 mg/dL (ref 65–99)
Glucose-Capillary: 128 mg/dL — ABNORMAL HIGH (ref 65–99)

## 2015-08-05 MED ORDER — HYDROMORPHONE HCL 1 MG/ML IJ SOLN
1.0000 mg | Freq: Once | INTRAMUSCULAR | Status: AC
  Administered 2015-08-05: 1 mg via INTRAVENOUS
  Filled 2015-08-05: qty 1

## 2015-08-05 MED ORDER — ATORVASTATIN CALCIUM 40 MG PO TABS: 40.0000 mg | ORAL_TABLET | Freq: Every day | ORAL | Status: DC

## 2015-08-05 MED ORDER — ASPIRIN 81 MG PO TBEC: 81.0000 mg | DELAYED_RELEASE_TABLET | Freq: Every day | ORAL | Status: DC

## 2015-08-05 MED ORDER — AMLODIPINE BESY-BENAZEPRIL HCL 10-20 MG PO CAPS: 1.0000 | ORAL_CAPSULE | Freq: Every day | ORAL | Status: DC

## 2015-08-05 MED ORDER — HYDRALAZINE HCL 20 MG/ML IJ SOLN
10.0000 mg | Freq: Once | INTRAMUSCULAR | Status: AC
  Administered 2015-08-05: 10 mg via INTRAVENOUS
  Filled 2015-08-05: qty 1

## 2015-08-05 NOTE — Progress Notes (Signed)
Patient d/c home via cab. Stable.

## 2015-08-05 NOTE — Discharge Summary (Signed)
Physician Discharge Summary  Jacob Brady ZOX:[id number] DOB: [birth year] DOA: 08/03/2015  PCP: No primary care provider on file.  Admit date: 08/03/2015 Discharge date: 08/05/2015  Time spent: >35 minutes  Recommendations for Outpatient Follow-up:  F/u with PCP in 1 week F/u with vascular surgery in 3-4 weeks   Discharge Diagnoses:  Principal Problem:   Pancreatitis Active Problems:   Abnormal EKG   Essential hypertension   Diabetes mellitus type 2 in obese [facility])   PTSD (post-traumatic stress disorder)   Abdominal aortic aneurysm (AAA) (HCC)   Acute pancreatitis   Discharge Condition: stable   Diet recommendation: DM. Low sodium   Filed Weights   08/03/15 0245  Weight: 95.255 kg (210 lb)    History of present illness:  55 y/o male with PMH of HTN, HPL, DM (not on meds), PTSD presented with abdominal pains, nausea, vomiting -admitted with acute pancreatitis. Symptoms started on 07/25/2015, and describes them as a achy stingy deep pain which would radiate to his back. Symptoms initially self resolved, but came back 2 days later. He tried Tylenol and ibuprofen without relief. He notes associated symptoms of shortness of breath, decreased appetite, and dry heaves. -Upon admission into the emergency department, the patient is reevaluated and seen have elevated lipase of 292. CT scan of the abdomen shows mild inflammation around the pancreas and the incidental finding of a abdominal aortic aneurysm that is 3.8 cm and AP diameter.   Hospital Course:  1. Acute pancreatitis Etiology of his pancreatitis is not entirely clear, he is not drinking alcohol, not on any new medications or supplements. He has a history of cholecystectomy. CT abd: Mild acute pancreatitis, with mild soft tissue inflammation aboutthe distal body and tail of the pancreas. Trace associated fluid noted. No evidence of devascularization or pseudocyst formation at this time. -? Triggered by diuretic. Patient improved with  NPO, IVF, supportive management. Lipase is tending down. He is started and tolerating diet. D/w patient, recommended to f/u with GI for EUS  2. DM. Ha14c-8.6. Patient states that he doesn't take meds. D/w patient about DM management. He declined, but wants to f/u with PCP to start the treatment  3. HTN resume home regimen. Bu hold hctz  4. AAA with mild intramural thrombus incidentally, CT abdomen pelvis with dilatation of the infrarenal abdominal aorta to 3.8 cm in AP dimension with mild mural thrombus without evidence of luminal narrowing.  - start patient on Aspirin and Statin, recommended to follow up with vascular surgery as an outpatient for routine monitoring. -Hospitalist discussed with Dr. Darrick Penna over the phone, no role for anticoagulation 5.Tobacco abuse counseled for cessation, nicotine patch 6. PTSD (post-traumatic stress disorder) Patient currently on trazodone and venlafaxine   Procedures:  none (i.e. Studies not automatically included, echos, thoracentesis, etc; not x-rays)  Consultations:  none  Discharge Exam: Filed Vitals:   08/05/15 0407 08/05/15 0630  BP: 165/103 164/94  Pulse: 86   Temp: 97.9 F (36.6 C)   Resp: 16     General: alert. No distress  Cardiovascular: s1,s2 rrr Respiratory: CTA BL  Discharge Instructions  Discharge Instructions    Diet - low sodium heart healthy    Complete by:  As directed      Discharge instructions    Complete by:  As directed   Please follow up with PCP in 1 weeks     Increase activity slowly    Complete by:  As directed  Medication List    STOP taking these medications        hydrochlorothiazide 12.5 MG tablet  Commonly known as:  HYDRODIURIL      TAKE these medications        amLODipine-benazepril 10-20 MG capsule  Commonly known as:  LOTREL  Take 1 capsule by mouth daily.     aspirin 81 MG EC tablet  Take 1 tablet (81 mg total) by mouth daily.     atorvastatin 40 MG tablet   Commonly known as:  LIPITOR  Take 1 tablet (40 mg total) by mouth daily at 6 PM.     ibuprofen 200 MG tablet  Commonly known as:  ADVIL,MOTRIN  Take 200 mg by mouth every 6 (six) hours as needed.     traZODone 100 MG tablet  Commonly known as:  DESYREL  Take 200 mg by mouth at bedtime.     venlafaxine 75 MG tablet  Commonly known as:  EFFEXOR  Take 75 mg by mouth 2 (two) times daily.       No Known Allergies     Follow-up Information    Follow up with [facility]. Schedule an appointment as soon as possible for a visit in 3 months.   Contact information:   [address] [place] [address] [phone]       The results of significant diagnostics from this hospitalization (including imaging, microbiology, ancillary and laboratory) are listed below for reference.    Significant Diagnostic Studies: Ct Abdomen Pelvis W Contrast  08/03/2015  CLINICAL DATA:  Acute onset of worsening left upper quadrant abdominal pain, radiating to the back. Leukocytosis. Decreased appetite and nausea. Initial encounter. EXAM: CT ABDOMEN AND PELVIS WITH CONTRAST TECHNIQUE: Multidetector CT imaging of the abdomen and pelvis was performed using the standard protocol following bolus administration of intravenous contrast. CONTRAST:  OMNIPAQUE IOHEXOL 300 MG/ML  SOLN COMPARISON:  None. FINDINGS: The visualized lung bases are clear. Mild soft tissue inflammation is noted about the distal body and tail of the pancreas, compatible with acute pancreatitis. Trace associated fluid is noted. There is no evidence of devascularization or pseudocyst formation at this time. The liver and spleen are unremarkable in appearance. The patient is status post cholecystectomy, with clips noted at the gallbladder fossa. The adrenal glands are unremarkable. Mild nonspecific perinephric stranding is noted bilaterally. The kidneys are otherwise unremarkable. There is no evidence of  hydronephrosis. No renal or ureteral stones are seen. The small bowel is unremarkable in appearance. The stomach is within normal limits. No acute vascular abnormalities are seen. There is dilatation of the infrarenal abdominal aorta to 3.8 cm in AP dimension. Mild mural thrombus is noted along the infrarenal abdominal aorta, without evidence of luminal narrowing. Scattered calcification is seen along the abdominal aorta and its branches. The appendix is normal in caliber, without evidence of appendicitis. Scattered diverticulosis is noted along the descending and proximal sigmoid colon, without evidence of diverticulitis. The bladder is mildly distended and grossly unremarkable. The prostate remains normal in size. No inguinal lymphadenopathy is seen. No acute osseous abnormalities are identified. IMPRESSION: 1. Mild acute pancreatitis, with mild soft tissue inflammation about the distal body and tail of the pancreas. Trace associated fluid noted. No evidence of devascularization or pseudocyst formation at this time. 2. Dilatation of the infrarenal abdominal aorta to 3.8 cm in AP dimension. Mild mural thrombus along the abdominal aorta, without evidence of luminal narrowing. Scattered calcification along the abdominal aorta and  its branches. Recommend followup by ultrasound in 2 years. This recommendation follows ACR consensus guidelines: White Paper of the ACR Incidental Findings Committee II on Vascular Findings. J Am Coll Radiol 2013; 10:789-794. 3. Scattered diverticulosis along the descending and proximal sigmoid colon, without evidence of diverticulitis. Electronically Signed   By: Roanna Raider M.D.   On: 08/03/2015 04:03   Portable Chest 1 View  08/03/2015  CLINICAL DATA:  55 year old male with shortness of breath currently admitted with pancreatitis EXAM: PORTABLE CHEST 1 VIEW COMPARISON:  CT scan abdomen/ pelvis earlier today FINDINGS: The lungs are clear and negative for focal airspace consolidation,  pulmonary edema or suspicious pulmonary nodule. No pleural effusion or pneumothorax. Cardiac and mediastinal contours are within normal limits. No acute fracture or lytic or blastic osseous lesions. The visualized upper abdominal bowel gas pattern is unremarkable. IMPRESSION: No active cardiopulmonary disease. Electronically Signed   By: Malachy Moan M.D.   On: 08/03/2015 08:42    Microbiology: No results found for this or any previous visit (from the past 240 hour(s)).   Labs: Basic Metabolic Panel:  Recent Labs Lab 08/03/15 0300 08/04/15 0535  NA 135 135  K 3.9 4.1  CL 99* 101  CO2 25 25  GLUCOSE 302* 128*  BUN 17 19  CREATININE 0.81 0.78  CALCIUM 9.8 8.9  MG  --  1.6*   Liver Function Tests:  Recent Labs Lab 08/03/15 0300 08/04/15 0535  AST 21 25  ALT 32 30  ALKPHOS 82 74  BILITOT 0.6 1.0  PROT 8.1 7.4  ALBUMIN 4.3 3.8    Recent Labs Lab 08/03/15 0300 08/04/15 0535  LIPASE 292* 93*   No results for input(s): AMMONIA in the last 168 hours. CBC:  Recent Labs Lab 08/03/15 0300 08/04/15 0535  WBC 15.7* 12.7*  NEUTROABS 10.3*  --   HGB 16.1 13.6  HCT 46.6 41.5  MCV 87.3 90.0  PLT 234 226   Cardiac Enzymes:  Recent Labs Lab 08/03/15 0300  TROPONINI <0.03   BNP: BNP (last 3 results) No results for input(s): BNP in the last 8760 hours.  ProBNP (last 3 results) No results for input(s): PROBNP in the last 8760 hours.  CBG:  Recent Labs Lab 08/04/15 1915 08/04/15 2030 08/04/15 2355 08/05/15 0405 08/05/15 0716  GLUCAP 88 133* 94 87 128*       Signed:  Tabithia Stroder N  [facility] 08/05/2015, 11:12 AM

## 2015-08-05 NOTE — Progress Notes (Signed)
BP elevated--163/104,  MD notitfied. Will follow and monitor. SRP, RN

## 2015-08-05 NOTE — Progress Notes (Signed)
Discharge instructions given to patient, verbalized understanding. Pain is controlled. Awaiting for cab.

## 2018-08-26 DIAGNOSIS — I2510 Atherosclerotic heart disease of native coronary artery without angina pectoris: Secondary | ICD-10-CM

## 2018-08-26 DIAGNOSIS — I25119 Atherosclerotic heart disease of native coronary artery with unspecified angina pectoris: Secondary | ICD-10-CM | POA: Insufficient documentation

## 2018-08-26 HISTORY — DX: Atherosclerotic heart disease of native coronary artery without angina pectoris: I25.10

## 2018-08-26 HISTORY — PX: NM MYOVIEW LTD: HXRAD82

## 2019-01-03 ENCOUNTER — Encounter (HOSPITAL_COMMUNITY): Payer: Self-pay

## 2019-01-03 ENCOUNTER — Inpatient Hospital Stay (HOSPITAL_COMMUNITY)
Admission: AD | Admit: 2019-01-03 | Discharge: 2019-01-08 | DRG: 885 | Disposition: A | Discharge status: Home / Self Care | Payer: No Typology Code available for payment source | Source: Intra-hospital | Attending: Psychiatry | Admitting: Psychiatry

## 2019-01-03 ENCOUNTER — Other Ambulatory Visit: Payer: Self-pay

## 2019-01-03 ENCOUNTER — Emergency Department (HOSPITAL_COMMUNITY)
Admission: EM | Admit: 2019-01-03 | Discharge: 2019-01-03 | Disposition: A | Discharge status: Other Acute Inpatient Hospital | Payer: Non-veteran care

## 2019-01-03 ENCOUNTER — Observation Stay (HOSPITAL_COMMUNITY)
Admission: RE | Admit: 2019-01-03 | Discharge: 2019-01-03 | Disposition: A | Discharge status: Other Acute Inpatient Hospital | Payer: No Typology Code available for payment source | Attending: Emergency Medicine | Admitting: Emergency Medicine

## 2019-01-03 DIAGNOSIS — F17200 Nicotine dependence, unspecified, uncomplicated: Secondary | ICD-10-CM | POA: Diagnosis not present

## 2019-01-03 DIAGNOSIS — F419 Anxiety disorder, unspecified: Secondary | ICD-10-CM | POA: Diagnosis present

## 2019-01-03 DIAGNOSIS — E669 Obesity, unspecified: Secondary | ICD-10-CM | POA: Diagnosis not present

## 2019-01-03 DIAGNOSIS — G4700 Insomnia, unspecified: Secondary | ICD-10-CM | POA: Diagnosis present

## 2019-01-03 DIAGNOSIS — I10 Essential (primary) hypertension: Secondary | ICD-10-CM | POA: Diagnosis present

## 2019-01-03 DIAGNOSIS — Z794 Long term (current) use of insulin: Secondary | ICD-10-CM | POA: Diagnosis not present

## 2019-01-03 DIAGNOSIS — R739 Hyperglycemia, unspecified: Secondary | ICD-10-CM

## 2019-01-03 DIAGNOSIS — Z6839 Body mass index (BMI) 39.0-39.9, adult: Secondary | ICD-10-CM | POA: Diagnosis not present

## 2019-01-03 DIAGNOSIS — Z9114 Patient's other noncompliance with medication regimen: Secondary | ICD-10-CM | POA: Insufficient documentation

## 2019-01-03 DIAGNOSIS — Z791 Long term (current) use of non-steroidal anti-inflammatories (NSAID): Secondary | ICD-10-CM | POA: Diagnosis not present

## 2019-01-03 DIAGNOSIS — F332 Major depressive disorder, recurrent severe without psychotic features: Secondary | ICD-10-CM | POA: Diagnosis not present

## 2019-01-03 DIAGNOSIS — F4310 Post-traumatic stress disorder, unspecified: Secondary | ICD-10-CM | POA: Diagnosis present

## 2019-01-03 DIAGNOSIS — M1990 Unspecified osteoarthritis, unspecified site: Secondary | ICD-10-CM | POA: Insufficient documentation

## 2019-01-03 DIAGNOSIS — E119 Type 2 diabetes mellitus without complications: Secondary | ICD-10-CM | POA: Insufficient documentation

## 2019-01-03 DIAGNOSIS — Z79899 Other long term (current) drug therapy: Secondary | ICD-10-CM

## 2019-01-03 DIAGNOSIS — R45851 Suicidal ideations: Secondary | ICD-10-CM | POA: Diagnosis not present

## 2019-01-03 DIAGNOSIS — Z7982 Long term (current) use of aspirin: Secondary | ICD-10-CM | POA: Diagnosis not present

## 2019-01-03 LAB — COMPREHENSIVE METABOLIC PANEL
ALT: 52 U/L — ABNORMAL HIGH (ref 0–44)
AST: 23 U/L (ref 15–41)
Albumin: 3.9 g/dL (ref 3.5–5.0)
Alkaline Phosphatase: 74 U/L (ref 38–126)
Anion gap: 13 (ref 5–15)
BUN: 22 mg/dL — ABNORMAL HIGH (ref 6–20)
CO2: 21 mmol/L — ABNORMAL LOW (ref 22–32)
Calcium: 9.4 mg/dL (ref 8.9–10.3)
Chloride: 90 mmol/L — ABNORMAL LOW (ref 98–111)
Creatinine, Ser: 0.86 mg/dL (ref 0.61–1.24)
GFR calc Af Amer: >60 mL/min (ref >60)
GFR calc non Af Amer: >60 mL/min (ref >60)
Glucose, Bld: 492 mg/dL — ABNORMAL HIGH (ref 70–99)
Potassium: 4.3 mmol/L (ref 3.5–5.1)
Sodium: 124 mmol/L — ABNORMAL LOW (ref 135–145)
Total Bilirubin: 0.1 mg/dL — ABNORMAL LOW (ref 0.3–1.2)
Total Protein: 7.2 g/dL (ref 6.5–8.1)

## 2019-01-03 LAB — CBC
HCT: 47.7 % (ref 39.0–52.0)
Hemoglobin: 16.4 g/dL (ref 13.0–17.0)
MCH: 31.1 pg (ref 26.0–34.0)
MCHC: 34.4 g/dL (ref 30.0–36.0)
MCV: 90.3 fL (ref 80.0–100.0)
Platelets: 201 10*3/uL (ref 150–400)
RBC: 5.28 MIL/uL (ref 4.22–5.81)
RDW: 13.2 % (ref 11.5–15.5)
WBC: 11.9 10*3/uL — ABNORMAL HIGH (ref 4.0–10.5)
nRBC: 0 % (ref 0.0–0.2)

## 2019-01-03 LAB — URINALYSIS, ROUTINE W REFLEX MICROSCOPIC
Bacteria, UA: NONE SEEN
Bilirubin Urine: NEGATIVE
Glucose, UA: >=500 mg/dL — AB
Hgb urine dipstick: NEGATIVE
Ketones, ur: NEGATIVE mg/dL
Leukocytes,Ua: NEGATIVE
Nitrite: NEGATIVE
Protein, ur: NEGATIVE mg/dL
Specific Gravity, Urine: 1.026 (ref 1.005–1.030)
pH: 5 (ref 5.0–8.0)

## 2019-01-03 LAB — RAPID URINE DRUG SCREEN, HOSP PERFORMED
Amphetamines: NOT DETECTED
Barbiturates: NOT DETECTED
Benzodiazepines: NOT DETECTED
Cocaine: NOT DETECTED
Opiates: NOT DETECTED
Tetrahydrocannabinol: NOT DETECTED

## 2019-01-03 LAB — TSH: TSH: 2.079 u[IU]/mL (ref 0.350–4.500)

## 2019-01-03 LAB — CBG MONITORING, ED
Glucose-Capillary: 445 mg/dL — ABNORMAL HIGH (ref 70–99)
Glucose-Capillary: 367 mg/dL — ABNORMAL HIGH (ref 70–99)
Glucose-Capillary: 300 mg/dL — ABNORMAL HIGH (ref 70–99)

## 2019-01-03 LAB — HEMOGLOBIN A1C
Hgb A1c MFr Bld: 12 % — ABNORMAL HIGH (ref 4.8–5.6)
Mean Plasma Glucose: 297.7 mg/dL

## 2019-01-03 LAB — LIPID PANEL
Cholesterol: 449 mg/dL — ABNORMAL HIGH (ref 0–200)
Triglycerides: 3631 mg/dL — ABNORMAL HIGH (ref <150)
VLDL: UNDETERMINED mg/dL (ref 0–40)

## 2019-01-03 LAB — GLUCOSE, CAPILLARY
Glucose-Capillary: 362 mg/dL — ABNORMAL HIGH (ref 70–99)
Glucose-Capillary: 349 mg/dL — ABNORMAL HIGH (ref 70–99)

## 2019-01-03 LAB — ETHANOL: Alcohol, Ethyl (B): <10 mg/dL

## 2019-01-03 LAB — ACETAMINOPHEN LEVEL: Acetaminophen (Tylenol), Serum: <10 ug/mL — ABNORMAL LOW (ref 10–30)

## 2019-01-03 MED ORDER — INSULIN ASPART 100 UNIT/ML ~~LOC~~ SOLN
0.0000 [IU] | Freq: Three times a day (TID) | SUBCUTANEOUS | Status: DC
  Administered 2019-01-03 – 2019-01-04 (×2): 7 [IU] via SUBCUTANEOUS
  Administered 2019-01-04: 3 [IU] via SUBCUTANEOUS
  Administered 2019-01-04: 7 [IU] via SUBCUTANEOUS
  Administered 2019-01-04: 9 [IU] via SUBCUTANEOUS
  Administered 2019-01-05: 7 [IU] via SUBCUTANEOUS
  Administered 2019-01-05: 2 [IU] via SUBCUTANEOUS
  Administered 2019-01-05: 3 [IU] via SUBCUTANEOUS
  Administered 2019-01-05: 5 [IU] via SUBCUTANEOUS
  Administered 2019-01-06: 3 [IU] via SUBCUTANEOUS
  Administered 2019-01-06: 5 [IU] via SUBCUTANEOUS
  Administered 2019-01-06: 12:00:00 3 [IU] via SUBCUTANEOUS
  Administered 2019-01-06 – 2019-01-07 (×2): 5 [IU] via SUBCUTANEOUS
  Administered 2019-01-07: 2 [IU] via SUBCUTANEOUS
  Administered 2019-01-07: 3 [IU] via SUBCUTANEOUS
  Administered 2019-01-07 – 2019-01-08 (×2): 5 [IU] via SUBCUTANEOUS
  Administered 2019-01-08: 3 [IU] via SUBCUTANEOUS

## 2019-01-03 MED ORDER — ACETAMINOPHEN 325 MG PO TABS: 650.0000 mg | ORAL_TABLET | Freq: Four times a day (QID) | ORAL | Status: DC | PRN

## 2019-01-03 MED ORDER — INSULIN ASPART 100 UNIT/ML ~~LOC~~ SOLN
10.0000 [IU] | Freq: Once | SUBCUTANEOUS | Status: AC
  Administered 2019-01-03: 10 [IU] via SUBCUTANEOUS

## 2019-01-03 MED ORDER — SODIUM CHLORIDE 0.9 % IV BOLUS
2000.0000 mL | Freq: Once | INTRAVENOUS | Status: AC
  Administered 2019-01-03: 2000 mL via INTRAVENOUS

## 2019-01-03 MED ORDER — AMLODIPINE BESYLATE 5 MG PO TABS
10.0000 mg | ORAL_TABLET | Freq: Every day | ORAL | Status: DC
  Administered 2019-01-03: 10 mg via ORAL
  Filled 2019-01-03: qty 2

## 2019-01-03 MED ORDER — HYDROXYZINE HCL 25 MG PO TABS
25.0000 mg | ORAL_TABLET | Freq: Three times a day (TID) | ORAL | Status: DC | PRN
  Administered 2019-01-03 – 2019-01-07 (×5): 25 mg via ORAL
  Filled 2019-01-03 (×6): qty 1

## 2019-01-03 MED ORDER — MAGNESIUM HYDROXIDE 400 MG/5ML PO SUSP
30.0000 mL | Freq: Every day | ORAL | Status: DC | PRN
  Filled 2019-01-03: qty 30

## 2019-01-03 MED ORDER — IBUPROFEN 800 MG PO TABS
800.0000 mg | ORAL_TABLET | Freq: Three times a day (TID) | ORAL | Status: DC | PRN
  Administered 2019-01-03: 800 mg via ORAL
  Filled 2019-01-03: qty 1

## 2019-01-03 MED ORDER — INSULIN ASPART 100 UNIT/ML ~~LOC~~ SOLN: 0.0000 [IU] | Freq: Three times a day (TID) | SUBCUTANEOUS | Status: DC

## 2019-01-03 MED ORDER — NICOTINE 21 MG/24HR TD PT24
21.0000 mg | MEDICATED_PATCH | Freq: Every day | TRANSDERMAL | Status: DC
  Administered 2019-01-03 – 2019-01-08 (×6): 21 mg via TRANSDERMAL
  Filled 2019-01-03 (×9): qty 1

## 2019-01-03 MED ORDER — AMLODIPINE BESY-BENAZEPRIL HCL 10-20 MG PO CAPS: 1.0000 | ORAL_CAPSULE | Freq: Every day | ORAL | Status: DC

## 2019-01-03 MED ORDER — TRAZODONE HCL 50 MG PO TABS
50.0000 mg | ORAL_TABLET | Freq: Every evening | ORAL | Status: DC | PRN
  Administered 2019-01-05 – 2019-01-07 (×2): 50 mg via ORAL
  Filled 2019-01-03 (×2): qty 1

## 2019-01-03 MED ORDER — METFORMIN HCL 500 MG PO TABS
500.0000 mg | ORAL_TABLET | Freq: Two times a day (BID) | ORAL | Status: DC
  Administered 2019-01-04 – 2019-01-08 (×9): 500 mg via ORAL
  Filled 2019-01-03 (×13): qty 1

## 2019-01-03 MED ORDER — BENAZEPRIL HCL 20 MG PO TABS
20.0000 mg | ORAL_TABLET | Freq: Every day | ORAL | Status: DC
  Filled 2019-01-03: qty 1

## 2019-01-03 MED ORDER — AMLODIPINE BESYLATE 10 MG PO TABS
10.0000 mg | ORAL_TABLET | Freq: Every day | ORAL | Status: DC
  Administered 2019-01-04 – 2019-01-08 (×5): 10 mg via ORAL
  Filled 2019-01-03 (×7): qty 1

## 2019-01-03 MED ORDER — BENAZEPRIL HCL 20 MG PO TABS
20.0000 mg | ORAL_TABLET | Freq: Every day | ORAL | Status: DC
  Administered 2019-01-03: 20 mg via ORAL
  Filled 2019-01-03: qty 1
  Filled 2019-01-03: qty 2
  Filled 2019-01-03: qty 1

## 2019-01-03 MED ORDER — NICOTINE 21 MG/24HR TD PT24: 21.0000 mg | MEDICATED_PATCH | Freq: Every day | TRANSDERMAL | Status: DC

## 2019-01-03 MED ORDER — LORAZEPAM 1 MG PO TABS
1.0000 mg | ORAL_TABLET | Freq: Once | ORAL | Status: AC
  Administered 2019-01-03: 1 mg via ORAL
  Filled 2019-01-03: qty 1

## 2019-01-03 MED ORDER — BENAZEPRIL HCL 10 MG PO TABS
20.0000 mg | ORAL_TABLET | Freq: Every day | ORAL | Status: DC
  Administered 2019-01-04 – 2019-01-08 (×5): 20 mg via ORAL
  Filled 2019-01-03: qty 1
  Filled 2019-01-03: qty 2
  Filled 2019-01-03: qty 1
  Filled 2019-01-03 (×2): qty 2
  Filled 2019-01-03: qty 1
  Filled 2019-01-03 (×2): qty 2

## 2019-01-03 MED ORDER — ALUM & MAG HYDROXIDE-SIMETH 200-200-20 MG/5ML PO SUSP: 30.0000 mL | ORAL | Status: DC | PRN

## 2019-01-03 MED ORDER — TRAZODONE HCL 50 MG PO TABS: 50.0000 mg | ORAL_TABLET | Freq: Every evening | ORAL | Status: DC | PRN

## 2019-01-03 MED ORDER — HYDROXYZINE HCL 25 MG PO TABS: 25.0000 mg | ORAL_TABLET | Freq: Three times a day (TID) | ORAL | Status: DC | PRN

## 2019-01-03 MED ORDER — AMLODIPINE BESYLATE 5 MG PO TABS: 10.0000 mg | ORAL_TABLET | Freq: Every day | ORAL | Status: DC

## 2019-01-03 MED ORDER — IBUPROFEN 800 MG PO TABS
800.0000 mg | ORAL_TABLET | Freq: Three times a day (TID) | ORAL | Status: DC | PRN
  Administered 2019-01-03 – 2019-01-04 (×2): 800 mg via ORAL
  Filled 2019-01-03 (×2): qty 1

## 2019-01-03 MED ORDER — SODIUM CHLORIDE 0.9 % IV SOLN: INTRAVENOUS | Status: DC

## 2019-01-03 NOTE — BH Assessment (Addendum)
Assessment Note  Jacob Brady is a 58 y.o. male who [facility] himself [facility] due to an inability to sleep for the last 4 days which has resulted in him deciding tonight (01/02/2019 at 2230) that "death is better." Pt reports he wrote his sister and his daughter notes and left them for them in his apartment and then attempted to hang himself; he states he tied the object around his neck but couldn't put any pressure against it.  Pt denies HI, AVH, SA, access to guns/weapons, or involvement in the legal system. Pt shares he engages in NSSIB via pulling off his toenails. Pt states he has not been taking his medication as prescribed due to difficulties sleeping over the last several months and his inability to determine if some of his other medications--including his depression medication or his bipolar medication--are effecting his ability to sleep.  Pt stated he has no one available for clinician to contact for collateral information.  Pt is oriented x4. Pt's recent and remote memory is "hazy," which pt attributes to his lack of sleep. Pt was cooperative throughout the assessment process. Pt's insight, judgement, and impulse control is impaired at this time.   Diagnosis: F33.2, Major depressive disorder, Recurrent episode, Severe   Past Medical History:  Past Medical History:  Diagnosis Date  . Arthritis   . Diabetes mellitus without complication (HCC)   . Hypertension   . PTSD (post-traumatic stress disorder)     Past Surgical History:  Procedure Laterality Date  . CHOLECYSTECTOMY    . WRIST FRACTURE SURGERY Right     Family History: No family history on file.  Social History:  reports that he has been smoking. He has never used smokeless tobacco. He reports that he does not drink alcohol or use drugs.  Additional Social History:  Alcohol / Drug Use Pain Medications: Please see MAR Prescriptions: Please see MAR Over the Counter: Please see MAR History of alcohol / drug use?:  No history of alcohol / drug abuse Longest period of sobriety (when/how long): Pt denies SA  CIWA: CIWA-Ar BP: (!) 160/103 Pulse Rate: 98 COWS:    Allergies: No Known Allergies  Home Medications:  Medications Prior to Admission  Medication Sig Dispense Refill  . amLODipine-benazepril (LOTREL) 10-20 MG capsule Take 1 capsule by mouth daily. 30 capsule 2  . aspirin EC 81 MG EC tablet Take 1 tablet (81 mg total) by mouth daily.    Marland Kitchen. atorvastatin (LIPITOR) 40 MG tablet Take 1 tablet (40 mg total) by mouth daily at 6 PM. 30 tablet 1  . ibuprofen (ADVIL,MOTRIN) 200 MG tablet Take 200 mg by mouth every 6 (six) hours as needed.    . traZODone (DESYREL) 100 MG tablet Take 200 mg by mouth at bedtime.    Marland Kitchen. venlafaxine (EFFEXOR) 75 MG tablet Take 75 mg by mouth 2 (two) times daily.      OB/GYN Status:  No LMP for male patient.  General Assessment Data [facility] TTS Assessment: In system Is this a Tele or Face-to-Face Assessment?: Face-to-Face Is this an Initial Assessment or a Re-assessment for this encounter?: Initial Assessment Patient Accompanied by:: N/A Language Other than English: No Living Arrangements: Other (Comment)(Pt lives in his own home) What gender do you identify as?: Male Marital status: Divorced Juanell FairlyMaiden name: Laurell JosephsBurke Pregnancy Status: No Living Arrangements: Alone Can pt return to current living arrangement?: Yes Admission Status: Voluntary Is patient capable of signing voluntary admission?: Yes Referral Source:  Self/Family/Friend Insurance type: [facility]  Medical Screening Exam ([place]) Medical Exam completed: Yes  Crisis Care Plan Living Arrangements: Alone Legal Guardian: Other:(Self) Name of Psychiatrist: [facility] - pt unsure of name Name of Therapist: Clemmie Krill - [facility] Suicide Prevention Coordinator  Education Status Is patient currently in school?: No Is the patient employed, unemployed or receiving disability?: Receiving  disability income  Risk to self with the past 6 months Suicidal Ideation: Yes-Currently Present Has patient been a risk to self within the past 6 months prior to admission? : Yes Suicidal Intent: Yes-Currently Present Has patient had any suicidal intent within the past 6 months prior to admission? : Yes Is patient at risk for suicide?: Yes Suicidal Plan?: Yes-Currently Present Has patient had any suicidal plan within the past 6 months prior to admission? : Yes Specify Current Suicidal Plan: Pt attempted to hang himself tonight Access to Means: Yes Specify Access to Suicidal Means: Pt has access to things to hang himself with What has been your use of drugs/alcohol within the last 12 months?: Pt denies use of EtOH and substances Previous Attempts/Gestures: Yes How many times?: (Unsure) Other Self Harm Risks: Pt cannot sleep, his medication is not working Triggers for Past Attempts: Unknown Intentional Self Injurious Behavior: (Pt engages in NSSIB via pulling off his toenails) Family Suicide History: No Recent stressful life event(s): Other (Comment)(Pt's medication is not working and he is unable to sleep) Persecutory voices/beliefs?: No Depression: Yes Depression Symptoms: Insomnia, Isolating, Fatigue, Loss of interest in usual pleasures, Feeling worthless/self pity Substance abuse history and/or treatment for substance abuse?: No Suicide prevention information given to non-admitted patients: Not applicable  Risk to Others within the past 6 months Homicidal Ideation: No Does patient have any lifetime risk of violence toward others beyond the six months prior to admission? : No Thoughts of Harm to Others: No Current Homicidal Intent: No Current Homicidal Plan: No Access to Homicidal Means: No Identified Victim: None noted History of harm to others?: No Assessment of Violence: On admission Violent Behavior Description: None noted Does patient have access to weapons?: No(Pt denies  access to guns/weapons) Criminal Charges Pending?: No Does patient have a court date: No Is patient on probation?: No  Psychosis Hallucinations: None noted Delusions: None noted  Mental Status Report Appearance/Hygiene: Unremarkable Eye Contact: Good Motor Activity: Unremarkable Speech: Logical/coherent, Other (Comment)(Somewhat confused, attributes it to lack of sleep) Level of Consciousness: Drowsy Mood: Sad Affect: Appropriate to circumstance Anxiety Level: Severe Thought Processes: Relevant Judgement: Impaired Orientation: Person, Place, Time, Situation Obsessive Compulsive Thoughts/Behaviors: None  Cognitive Functioning Concentration: Poor Memory: Recent Impaired, Remote Impaired(Pt's memory is impaired, apparently due to lack of sleep) Is patient IDD: No Insight: Fair Impulse Control: Poor Appetite: Fair Have you had any weight changes? : No Change Sleep: Decreased Total Hours of Sleep: 1 Vegetative Symptoms: None  ADLScreening [facility]) Patient's cognitive ability adequate to safely complete daily activities?: Yes Patient able to express need for assistance with ADLs?: Yes Independently performs ADLs?: Yes (appropriate for developmental age)  Prior Inpatient Therapy Prior Inpatient Therapy: (UTA)  Prior Outpatient Therapy Prior Outpatient Therapy: Yes Prior Therapy Dates: Multiple Prior Therapy Facilty/Provider(s): [facility] - 3 prior [facility] psychiatrists Reason for Treatment: MDD Does patient have an ACCT team?: No Does patient have Intensive In-House Services?  : No Does patient have [facility] services? : No Does patient have P4CC services?: No  ADL Screening (condition at time of admission) Patient's cognitive ability adequate to safely complete daily activities?:  Yes Is the patient deaf or have difficulty hearing?: No Does the patient have difficulty seeing, even when wearing glasses/contacts?: No Does the patient have difficulty concentrating,  remembering, or making decisions?: No Patient able to express need for assistance with ADLs?: Yes Does the patient have difficulty dressing or bathing?: No Independently performs ADLs?: Yes (appropriate for developmental age) Does the patient have difficulty walking or climbing stairs?: No Weakness of Legs: None Weakness of Arms/Hands: None  Home Assistive Devices/Equipment Home Assistive Devices/Equipment: None  Therapy Consults (therapy consults require a physician order) PT Evaluation Needed: No OT Evalulation Needed: No SLP Evaluation Needed: No Abuse/Neglect Assessment (Assessment to be complete while patient is alone) Abuse/Neglect Assessment Can Be Completed: Yes Physical Abuse: Denies Verbal Abuse: Denies Sexual Abuse: Denies Exploitation of patient/patient's resources: Denies Self-Neglect: Denies Values / Beliefs Cultural Requests During Hospitalization: None Spiritual Requests During Hospitalization: None Consults Spiritual Care Consult Needed: No Social Work Consult Needed: No Merchant navy officerAdvance Directives (For Healthcare) Does Patient Have a Medical Advance Directive?: No Would patient like information on creating a medical advance directive?: No - Patient declined        Disposition: Nira ConnJason Berry, NP, reviewed pt's chart and information and determined pt meets inpatient criteria. [facility] [place] [facility] and [facility] are not currently [facility] diversion; pt's referral information will be faxed there later today to determine if he will be accepted. [facility] if [facility] is bed availability.   Disposition Initial Assessment Completed for this Encounter: Yes Disposition of Patient: Admit(Jason Allyson SabalBerry, NP, determined pt meets inpatient criteria) Type of inpatient treatment program: Adult Patient refused recommended treatment: No Mode of transportation if patient is discharged/movement?: N/A Patient referred to: Other  (Comment)([facility]  On Site Evaluation by:   Reviewed with Physician:    Ralph DowdySamantha L Dezmin Kittelson 01/03/2019 6:08 AM

## 2019-01-03 NOTE — Progress Notes (Signed)
Pt reported intolerance to Seroquel causing his liver enzymes to elevate.  When the allergies were put in, all the medications that contained corn starch came up as a contraindication/allergy.  Pt reported that he has taken those medications for a long time.  Notified Jason NP of the above but there is no option to override.  Notified oncoming nurse to verify with pharmacist today.

## 2019-01-03 NOTE — Plan of Care (Signed)
[facility] Observation Crisis Plan  Reason for Crisis Plan:  Crisis Stabilization   Plan of Care:  Referral for Telepsychiatry/Psychiatric Consult  Family Support:      Current Living Environment:  Living Arrangements: Alone  Insurance:   Hospital Account    Name Acct ID Class Status Primary Coverage   Jacob Brady, Jacob Brady [id number] [place] MH/DD/SAS - [employer]        Guarantor Account (for Hospital Account [id number])    Name Relation to Pt Service Area Active? Acct Type   Jacob Brady Self [facility] Yes Behavioral Health   Address Phone       [place], [place] [address] [phone])          Coverage Information (for Hospital Account [id number])    F/O Payor/Plan Precert #   [place] MH/DD/SAS/[place] [place] #   Brady, Jacob [id number]   Address Phone   [address] [place], [place] [address] [phone]      Legal Guardian:  Legal Guardian: Other:(Self)  Primary Care Provider:  Patient, No Pcp Per  Current Outpatient Providers:  [facility]  Psychiatrist:  Name of Psychiatrist: VA - pt unsure of name  Counselor/Therapist:  Name of Therapist: Clemmie Brady - [facility] Suicide Prevention Coordinator  Compliant with Medications:  No  Additional Information:   Jacob Brady 6/10/20206:56 AM

## 2019-01-03 NOTE — Progress Notes (Signed)
Spoke with April Alexander, Transfer Coordinator at [facility] who verifies that patient is "Service connected."  CSW noted that patient has CBG of 449 and BP of 160/103 at the present time, so is at [facility] ED for medical clearance.  Patient declined due to medical acuity.  Ms. Sheppard Coil stated "I know that he would not be considered right now due to uncontrolled blood sugars and BP."  Areatha Keas. Judi Cong, MSW, [place] Disposition Clinical Social Work [phone] (cell) [phone] (office)

## 2019-01-03 NOTE — ED Notes (Signed)
Pt medically cleared for [facility]. Pt calm, cooperative. Pt belongings and information given to [facility] transport.  Pt ambulatory off unit, escorted off unit by tech. Pt transported by [facility]

## 2019-01-03 NOTE — Progress Notes (Signed)
Adult Psychoeducational Group Note  Date:  01/03/2019 Time:  9:55 PM  Group Topic/Focus:  Wrap-Up Group:   The focus of this group is to help patients review their daily goal of treatment and discuss progress on daily workbooks.  Participation Level:  Active  Participation Quality:  Appropriate  Affect:  Appropriate  Cognitive:  Alert  Insight: Appropriate  Engagement in Group:  Engaged  Modes of Intervention:  Discussion  Additional Comments:  Pt rated his day 2/10. Pt stated that his current medications are not working and also his recent diagnosis of diabetes are contributing to how he feels. His goal is to get medications adjusted.   Wynelle Fanny R 01/03/2019, 9:55 PM

## 2019-01-03 NOTE — Progress Notes (Signed)
Patient to go to [facility] ED for medical clearance.  Patient has an elevated blood sugar of 492.  Dr. Mariea Clonts wishes for the patient to be evaluated medically.  [facility] ED charge nurse notified. [facility] called for transport.

## 2019-01-03 NOTE — Progress Notes (Signed)
Patient now denies SI HI AVH.

## 2019-01-03 NOTE — H&P (Signed)
BH Observation Unit Provider Admission PAA/H&P  Patient Identification: Jacob Brady Gassner MRN:  [id number] Date of Evaluation:  01/03/2019 Chief Complaint:  Pending Principal Diagnosis: Severe recurrent major depression without psychotic features (HCC) Diagnosis:  Active Problems:   Severe recurrent major depression without psychotic features (HCC)  History of Present Illness:    Jacob Brady is a 58 y.o. male who [facility] due to an inability to sleep for the last 4 days which has resulted in him deciding tonight (01/02/2019 at 2230) that "death is better." Pt reports he wrote his sister and his daughter notes and left them for them in his apartment and then attempted to hang himself; he states he tied the object around his neck but couldn't put any pressure against it.  Pt denies HI, AVH, SA, access to guns/weapons, or involvement in the legal system. Pt shares he engages in NSSIB via pulling off his toenails. Pt states he has not been taking his medication as prescribed due to difficulties sleeping over the last several months and his inability to determine if some of his other medications--including his depression medication or his bipolar medication--are effecting his ability to sleep.  Pt stated he has no one available for clinician to contact for collateral information.   On evaluation patient is alert and oriented x 4, pleasant, and cooperative. Speech is clear and coherent. Mood is depressed and affect is congruent with mood. Thought process is coherent and thought content is logical. Endorses SI with thoughts of hanging himself.  States that he was sitting in his room tonight and wrapped a sheet around his neck. Denies homicidal ideations. Denies substance abuse. Denies audiovisual hallucinations. No indication that patient is responding to internal stimuli.    Associated Signs/Symptoms: Depression Symptoms:  depressed mood, anhedonia, insomnia, psychomotor  agitation, feelings of worthlessness/guilt, hopelessness, suicidal thoughts with specific plan, anxiety, (Hypo) Manic Symptoms:  Denies Anxiety Symptoms:  General anxiety Psychotic Symptoms:  Denies PTSD Symptoms: Negative Total Time spent with patient: 30 minutes  Past Psychiatric History: MDD  Is the patient at risk to self? Yes.    Has the patient been a risk to self in the past 6 months? Yes.    Has the patient been a risk to self within the distant past? Yes.    Is the patient a risk to others? No.  Has the patient been a risk to others in the past 6 months? No.  Has the patient been a risk to others within the distant past? No.   Prior Inpatient Therapy:   Prior Outpatient Therapy:    Alcohol Screening:   Substance Abuse History in the last 12 months:  No. Consequences of Substance Abuse: NA Previous Psychotropic Medications: Yes  Psychological Evaluations: Yes  Past Medical History:  Past Medical History:  Diagnosis Date  . Arthritis   . Diabetes mellitus without complication (HCC)   . Hypertension   . PTSD (post-traumatic stress disorder)     Past Surgical History:  Procedure Laterality Date  . CHOLECYSTECTOMY    . WRIST FRACTURE SURGERY Right    Family History: No family history on file. Family Psychiatric History: No pertinent history Tobacco Screening:   Social History:  Social History   Substance and Sexual Activity  Alcohol Use No     Social History   Substance and Sexual Activity  Drug Use No    Additional Social History:  Allergies:  No Known Allergies Lab Results: No results found for this or any previous visit (from the past 48 hour(s)).  Blood Alcohol level:  No results found for: [facility]  Metabolic Disorder Labs:  Lab Results  Component Value Date   HGBA1C 8.2 (H) 08/03/2015   MPG 189 08/03/2015   No results found for: PROLACTIN Lab Results  Component Value Date   CHOL 211 (H) 08/03/2015    TRIG 343 (H) 08/03/2015   HDL 37 (L) 08/03/2015   CHOLHDL 5.7 08/03/2015   VLDL 69 (H) 08/03/2015   LDLCALC 105 (H) 08/03/2015    Current Medications: No current facility-administered medications for this encounter.    Current Outpatient Medications  Medication Sig Dispense Refill  . amLODipine-benazepril (LOTREL) 10-20 MG capsule Take 1 capsule by mouth daily. 30 capsule 2  . aspirin EC 81 MG EC tablet Take 1 tablet (81 mg total) by mouth daily.    Marland Kitchen atorvastatin (LIPITOR) 40 MG tablet Take 1 tablet (40 mg total) by mouth daily at 6 PM. 30 tablet 1  . ibuprofen (ADVIL,MOTRIN) 200 MG tablet Take 200 mg by mouth every 6 (six) hours as needed.    . traZODone (DESYREL) 100 MG tablet Take 200 mg by mouth at bedtime.    Marland Kitchen venlafaxine (EFFEXOR) 75 MG tablet Take 75 mg by mouth 2 (two) times daily.     PTA Medications: No medications prior to admission.    Musculoskeletal: Strength & Muscle Tone: within normal limits Gait & Station: normal Patient leans: Front  Psychiatric Specialty Exam: Physical Exam  Constitutional: He is oriented to person, place, and time. He appears well-developed and well-nourished. No distress.  HENT:  Head: Normocephalic and atraumatic.  Right Ear: External ear normal.  Left Ear: External ear normal.  Eyes: Pupils are equal, round, and reactive to light. Conjunctivae are normal. Right eye exhibits no discharge. Left eye exhibits no discharge. No scleral icterus.  Respiratory: Effort normal. No respiratory distress.  Musculoskeletal: Normal range of motion.  Neurological: He is alert and oriented to person, place, and time.  Skin: He is not diaphoretic.  Psychiatric: His speech is normal. His mood appears anxious. He is not withdrawn and not actively hallucinating. Thought content is not paranoid and not delusional. Cognition and memory are normal. He exhibits a depressed mood. He expresses suicidal ideation. He expresses no homicidal ideation. He expresses  suicidal plans.    Review of Systems  Constitutional: Negative for chills, diaphoresis, fever, malaise/fatigue and weight loss.  Eyes: Negative for blurred vision.  Respiratory: Negative for cough and shortness of breath.   Cardiovascular: Negative for chest pain.  Gastrointestinal: Negative for diarrhea, nausea and vomiting.  Neurological: Negative for dizziness and weakness.  Psychiatric/Behavioral: Positive for depression and suicidal ideas. Negative for hallucinations, memory loss and substance abuse. The patient is nervous/anxious and has insomnia.   All other systems reviewed and are negative.   Blood pressure (!) 160/103, pulse 98, temperature 98.5 F (36.9 C), temperature source Oral.There is no height or weight on file to calculate BMI.  General Appearance: Casual and Well Groomed  Eye Contact:  Good  Speech:  Clear and Coherent and Normal Rate  Volume:  Normal  Mood:  Anxious, Depressed, Hopeless and Worthless  Affect:  Congruent and Depressed  Thought Process:  Coherent, Goal Directed and Descriptions of Associations: Intact  Orientation:  Full (Time, Place, and Person)  Thought Content:  Logical and Hallucinations: None  Suicidal Thoughts:  Yes.  with intent/plan  Homicidal Thoughts:  No  Memory:  Immediate;   Good Recent;   Good  Judgement:  Impaired  Insight:  Lacking  Psychomotor Activity:  Normal  Concentration:  Concentration: Fair and Attention Span: Fair  Recall:  Good  Fund of Knowledge:  Good  Language:  Good  Akathisia:  Negative  Handed:  Right  AIMS (if indicated):     Assets:  Communication Skills Desire for Improvement Financial Resources/Insurance Housing Intimacy Leisure Time Physical Health  ADL's:  Intact  Cognition:  WNL  Sleep:         Treatment Plan Summary: Daily contact with patient to assess and evaluate symptoms and progress in treatment and Medication management  Observation Level/Precautions:  15 minute checks Laboratory:   CBC Chemistry Profile HbAIC UDS Lipids  Tylenol Ethanol  Psychotherapy:  Individual Medications:   Pharmacy to verify/Reconcile  Consultations:   Discharge Concerns:   Estimated LOS: Patient meets inpatient criteria. [facility], which will need to be verified by Social Work in the morning. Other:      Jackelyn PolingJason A Paiden Caraveo, NP 6/10/20205:20 AM

## 2019-01-03 NOTE — ED Notes (Signed)
I have just spoken with his nurse at [facility]. adult unit; and she assures me he is entered in Epic appropriately. I will dismiss him from our system now.

## 2019-01-03 NOTE — H&P (Signed)
Behavioral Health Medical Screening Exam  Jacob Brady is an 58 y.o. male.  Total Time spent with patient: 30 minutes  Psychiatric Specialty Exam: Physical Exam  Constitutional: He is oriented to person, place, and time. He appears well-developed and well-nourished. No distress.  HENT:  Head: Normocephalic and atraumatic.  Right Ear: External ear normal.  Left Ear: External ear normal.  Eyes: Pupils are equal, round, and reactive to light. Conjunctivae are normal. Right eye exhibits no discharge. Left eye exhibits no discharge. No scleral icterus.  Respiratory: Effort normal. No respiratory distress.  Musculoskeletal: Normal range of motion.  Neurological: He is alert and oriented to person, place, and time.  Skin: He is not diaphoretic.  Psychiatric: His speech is normal. His mood appears anxious. He is not withdrawn and not actively hallucinating. Thought content is not paranoid and not delusional. Cognition and memory are normal. He exhibits a depressed mood. He expresses suicidal ideation. He expresses no homicidal ideation. He expresses suicidal plans.    Review of Systems  Constitutional: Negative for chills, diaphoresis, fever, malaise/fatigue and weight loss.  Eyes: Negative for blurred vision.  Respiratory: Negative for cough and shortness of breath.   Cardiovascular: Negative for chest pain.  Gastrointestinal: Negative for diarrhea, nausea and vomiting.  Neurological: Negative for dizziness and weakness.  Psychiatric/Behavioral: Positive for depression and suicidal ideas. Negative for hallucinations, memory loss and substance abuse. The patient is nervous/anxious and has insomnia.   All other systems reviewed and are negative.   Blood pressure (!) 160/103, pulse 98, temperature 98.5 F (36.9 C), temperature source Oral.There is no height or weight on file to calculate BMI.  General Appearance: Casual and Well Groomed  Eye Contact:  Good  Speech:  Clear and Coherent and  Normal Rate  Volume:  Normal  Mood:  Anxious, Depressed, Hopeless and Worthless  Affect:  Congruent and Depressed  Thought Process:  Coherent, Goal Directed and Descriptions of Associations: Intact  Orientation:  Full (Time, Place, and Person)  Thought Content:  Logical and Hallucinations: None  Suicidal Thoughts:  Yes.  with intent/plan  Homicidal Thoughts:  No  Memory:  Immediate;   Good Recent;   Good  Judgement:  Impaired  Insight:  Lacking  Psychomotor Activity:  Normal  Concentration:  Concentration: Fair and Attention Span: Fair  Recall:  Good  Fund of Knowledge:  Good  Language:  Good  Akathisia:  Negative  Handed:  Right  AIMS (if indicated):     Assets:  Communication Skills Desire for Improvement Financial Resources/Insurance Housing Intimacy Leisure Time Physical Health  ADL's:  Intact  Cognition:  WNL  Sleep:         Blood pressure (!) 162/94, pulse 86, temperature 98.2 F (36.8 C), temperature source Oral, resp. rate 18, height 5\' 10"  (1.778 m), weight 124.7 kg, SpO2 95 %.  Recommendations:  Based on my evaluation the patient does not appear to have an emergency medical condition.  Rozetta Nunnery, NP 01/03/2019, 9:29 PM

## 2019-01-03 NOTE — ED Triage Notes (Signed)
Pt came to ED from [facility] for medical clearance. Pt in [facility] room 30. Pt cooperative, calm, no s/s of distress. Pt endorsed SI with a plan to hang self. Pt accompanied by [facility] tech.

## 2019-01-03 NOTE — BH Assessment (Signed)
[facility] Assessment Progress Note  Per Lindon Romp, NP, this pt requires psychiatric hospitalization at this time.  Leonia Reader, RN, [facility] has assigned pt to [facility] Rm 304-2.  Pt has signed Voluntary Admission and Consent for Treatment, as well as Consent to Release Information to the [facility] and [facility], and a notification call has been placed.  Signed forms have been faxed to [facility].  Pt's nurse, Eustaquio Maize, has been notified, and agrees to send original paperwork along with pt via [facility], and to call report to [phone].  Jalene Mullet, [place] Coordinator [phone]

## 2019-01-03 NOTE — ED Provider Notes (Signed)
[facility] DEPT Provider Note   CSN: [id number] Arrival date & time: 01/03/19  1107    History   Chief Complaint Chief Complaint  Patient presents with  . Medical Clearance    HPI Jamarious Febo is a 58 y.o. male.     58 year old male with prior history of diabetes but not taking any medications now presents from behavioral health due to hyperglycemia.  Patient endorses polyuria as well as polydipsia for the past 2 weeks.  Denies any weakness.  No chest or abdominal discomfort.  Blood sugar found to be elevated and was sent here for further management.     Past Medical History:  Diagnosis Date  . Arthritis   . Diabetes mellitus without complication ([place])   . Hypertension   . PTSD (post-traumatic stress disorder)     Patient Active Problem List   Diagnosis Date Noted  . Severe recurrent major depression without psychotic features ([place]) 01/03/2019  . Pancreatitis 08/03/2015  . Abnormal EKG 08/03/2015  . Essential hypertension 08/03/2015  . Diabetes mellitus type 2 in obese ([place]) 08/03/2015  . PTSD (post-traumatic stress disorder) 08/03/2015  . Abdominal aortic aneurysm (AAA) ([place]) 08/03/2015  . Acute pancreatitis 08/03/2015    Past Surgical History:  Procedure Laterality Date  . CHOLECYSTECTOMY    . WRIST FRACTURE SURGERY Right         Home Medications    Prior to Admission medications   Not on File    Family History History reviewed. No pertinent family history.  Social History Social History   Tobacco Use  . Smoking status: Current Every Day Smoker  . Smokeless tobacco: Never Used  Substance Use Topics  . Alcohol use: No  . Drug use: No     Allergies   Hctz [hydrochlorothiazide] and Seroquel [quetiapine fumarate]   Review of Systems Review of Systems  All other systems reviewed and are negative.    Physical Exam Updated Vital Signs BP (!) 160/103 (BP Location: Right Arm)   Pulse 98   Temp 98.5 F  (36.9 C) (Oral)   Resp 20   Ht 1.778 m (5\' 10" )   Wt 124.7 kg   SpO2 97%   BMI 39.46 kg/m   Physical Exam Vitals signs and nursing note reviewed.  Constitutional:      General: He is not in acute distress.    Appearance: Normal appearance. He is well-developed. He is not toxic-appearing.  HENT:     Head: Normocephalic and atraumatic.  Eyes:     General: Lids are normal.     Conjunctiva/sclera: Conjunctivae normal.     Pupils: Pupils are equal, round, and reactive to light.  Neck:     Musculoskeletal: Normal range of motion and neck supple.     Thyroid: No thyroid mass.     Trachea: No tracheal deviation.  Cardiovascular:     Rate and Rhythm: Normal rate and regular rhythm.     Heart sounds: Normal heart sounds. No murmur. No gallop.   Pulmonary:     Effort: Pulmonary effort is normal. No respiratory distress.     Breath sounds: Normal breath sounds. No stridor. No decreased breath sounds, wheezing, rhonchi or rales.  Abdominal:     General: Bowel sounds are normal. There is no distension.     Palpations: Abdomen is soft.     Tenderness: There is no abdominal tenderness. There is no rebound.  Musculoskeletal: Normal range of motion.        General:  No tenderness.  Skin:    General: Skin is warm and dry.     Findings: No abrasion or rash.  Neurological:     Mental Status: He is alert and oriented to person, place, and time.     GCS: GCS eye subscore is 4. GCS verbal subscore is 5. GCS motor subscore is 6.     Cranial Nerves: No cranial nerve deficit.     Sensory: No sensory deficit.  Psychiatric:        Speech: Speech normal.        Behavior: Behavior normal.      ED Treatments / Results  Labs (all labs ordered are listed, but only abnormal results are displayed) Labs Reviewed  CBC - Abnormal; Notable for the following components:      Result Value   WBC 11.9 (*)    All other components within normal limits  COMPREHENSIVE METABOLIC PANEL - Abnormal; Notable  for the following components:   Sodium 124 (*)    Chloride 90 (*)    CO2 21 (*)    Glucose, Bld 492 (*)    BUN 22 (*)    ALT 52 (*)    Total Bilirubin 0.1 (*)    All other components within normal limits  ACETAMINOPHEN LEVEL - Abnormal; Notable for the following components:   Acetaminophen (Tylenol), Serum <10 (*)    All other components within normal limits  HEMOGLOBIN A1C - Abnormal; Notable for the following components:   Hgb A1c MFr Bld 12.0 (*)    All other components within normal limits  LIPID PANEL - Abnormal; Notable for the following components:   Cholesterol 449 (*)    Triglycerides 3,631 (*)    All other components within normal limits  RAPID URINE DRUG SCREEN, HOSP PERFORMED  ETHANOL  TSH  URINALYSIS, ROUTINE W REFLEX MICROSCOPIC    EKG None  Radiology No results found.  Procedures Procedures (including critical care time)  Medications Ordered in ED Medications  acetaminophen (TYLENOL) tablet 650 mg (has no administration in time range)  alum & mag hydroxide-simeth (MAALOX/MYLANTA) 200-200-20 MG/5ML suspension 30 mL (has no administration in time range)  magnesium hydroxide (MILK OF MAGNESIA) suspension 30 mL (has no administration in time range)  hydrOXYzine (ATARAX/VISTARIL) tablet 25 mg (has no administration in time range)  traZODone (DESYREL) tablet 50 mg (has no administration in time range)  amLODipine (NORVASC) tablet 10 mg (10 mg Oral Given 01/03/19 0628)    And  benazepril (LOTENSIN) tablet 20 mg (20 mg Oral Given 01/03/19 [id number])  ibuprofen (ADVIL) tablet 800 mg (800 mg Oral Given 01/03/19 [id number])  nicotine (NICODERM CQ - dosed in mg/24 hours) patch 21 mg (21 mg Transdermal Not Given 01/03/19 0950)  sodium chloride 0.9 % bolus 2,000 mL (has no administration in time range)  0.9 %  sodium chloride infusion (has no administration in time range)  insulin aspart (novoLOG) injection 10 Units (has no administration in time range)  LORazepam (ATIVAN) tablet 1  mg (1 mg Oral Given 01/03/19 [id number])     Initial Impression / Assessment and Plan / ED Course  I have reviewed the triage vital signs and the nursing notes.  Pertinent labs & imaging results that were available during my care of the patient were reviewed by me and considered in my medical decision making (see chart for details).        Patient has evidence of diabetes here.  No evidence of DKA.  Patient has pseudohyponatremia due to  his hyperglycemia at 492.  Treated with IV fluids as well as insulin.  Blood sugar has improved to 300.  Discussed the case with Dr. Sharma CovertNorman from psychiatry and she will start the patient on metformin and has accepted the patient back to behavioral health for continued treatment.  Final Clinical Impressions(s) / ED Diagnoses   Final diagnoses:  Severe recurrent major depression without psychotic features [facility](HCC)    ED Discharge Orders    None       Lorre NickAllen, Ayanni Tun, MD 01/03/19 1446

## 2019-01-03 NOTE — Progress Notes (Signed)
Jacob Brady is a 58 year old male being admitted voluntarily to [facility] Obs unit room 204.  He came to [facility] as a walk in for anxiety, suicidal ideation, attempted to hang self at home last night and poor sleep.  During [facility] Obs admission, he was very nervous and fidgety.  He continued to voice suicidal ideation and said "I should have used the gun I had access to while I was at home."  He did voice worry about being on the unit and not like being locked in.  "I feel like I am going to run out the door and I will make sure I get out of here."  He denied HI or A/V hallucinations.  Oriented him to the unit.  Belongings secured in locker # 49.  Skin search completed and no skin issues noted.  Obs admission paperwork completed and signed.  Q 15 minute checks initiated for safety.

## 2019-01-04 LAB — GLUCOSE, CAPILLARY
Glucose-Capillary: 245 mg/dL — ABNORMAL HIGH (ref 70–99)
Glucose-Capillary: 383 mg/dL — ABNORMAL HIGH (ref 70–99)
Glucose-Capillary: 347 mg/dL — ABNORMAL HIGH (ref 70–99)
Glucose-Capillary: 329 mg/dL — ABNORMAL HIGH (ref 70–99)

## 2019-01-04 MED ORDER — TEMAZEPAM 15 MG PO CAPS
15.0000 mg | ORAL_CAPSULE | Freq: Once | ORAL | Status: AC
  Administered 2019-01-04: 15 mg via ORAL
  Filled 2019-01-04: qty 1

## 2019-01-04 MED ORDER — PROPRANOLOL HCL 10 MG PO TABS
40.0000 mg | ORAL_TABLET | ORAL | Status: DC | PRN
  Administered 2019-01-07: 40 mg via ORAL
  Filled 2019-01-04: qty 4

## 2019-01-04 MED ORDER — CLONAZEPAM 1 MG PO TABS
2.0000 mg | ORAL_TABLET | Freq: Every day | ORAL | Status: DC
  Administered 2019-01-04 – 2019-01-07 (×4): 2 mg via ORAL
  Filled 2019-01-04 (×4): qty 2

## 2019-01-04 MED ORDER — MIRTAZAPINE 15 MG PO TBDP
15.0000 mg | ORAL_TABLET | Freq: Every day | ORAL | Status: DC
  Administered 2019-01-04 – 2019-01-06 (×3): 15 mg via ORAL
  Filled 2019-01-04 (×6): qty 1

## 2019-01-04 MED ORDER — VENLAFAXINE HCL ER 37.5 MG PO CP24
37.5000 mg | ORAL_CAPSULE | Freq: Every day | ORAL | Status: DC
  Filled 2019-01-04 (×2): qty 1

## 2019-01-04 NOTE — [facility] Suicide Risk Assessment (Signed)
[facility] Admission Suicide Risk Assessment   Nursing information obtained from:  Patient Demographic factors:  Male, Caucasian Current Mental Status:  Suicidal ideation indicated by patient Loss Factors:  NA Historical Factors:  Impulsivity Risk Reduction Factors:  Sense of responsibility to family  Total Time spent with patient: 45 minutes Principal Problem: DSD/depression Diagnosis:  Active Problems:   MDD (major depressive disorder), recurrent episode, severe (HCC)  Subjective Data: Patient contradicting all previous expressed suicidal statements/behavior/concerns, would not allow me to call his sister stating he does not know the number, lobbyist for discharge. Patient acknowledges he has access to a firearm, has given contradictory information insisting he is been misunderstood or statements were taken out of context or he is simply talking about past events rather than current behaviors and thoughts.  I explained to him there to many contradictions in the chart to allow him to be safely discharged to a reasonable degree of medical certainty  Monitor through the weekend treat venlafaxine withdrawal treat insomnia monitor for safety  Continued Clinical Symptoms:  Alcohol Use Disorder Identification Test Final Score (AUDIT): 0 The "Alcohol Use Disorders Identification Test", Guidelines for Use in Primary Care, Second Edition.  World Pharmacologist [facility]). Score between 0-7:  no or low risk or alcohol related problems. Score between 8-15:  moderate risk of alcohol related problems. Score between 16-19:  high risk of alcohol related problems. Score 20 or above:  warrants further diagnostic evaluation for alcohol dependence and treatment.   CLINICAL FACTORS:   Depression:   Severe   Musculoskeletal: Strength & Muscle Tone: within normal limits Gait & Station: normal Patient leans: N/A  Psychiatric Specialty Exam: Physical Exam hypertensive  ROS vascular-hypertension/history  of EKG abnormality/neurological negative for seizures/GI/GU history of pancreatitis he reports in the absence of alcohol usage  Blood pressure (!) 167/98, pulse 87, temperature 98.6 F (37 C), temperature source Oral, resp. rate 18, height 5\' 10"  (1.778 m), weight 124.7 kg, SpO2 100 %.Body mass index is 39.46 kg/m.  General Appearance: Casual  Eye Contact:  Good  Speech:  Clear and Coherent  Volume:  Normal  Mood:  Euthymic  Affect:  Constricted  Thought Process:  Goal Directed and Descriptions of Associations: Intact  Orientation:  Full (Time, Place, and Person)  Thought Content:  Logical and Tangential  Suicidal Thoughts:  No  Homicidal Thoughts:  No  Memory:  Immediate;   Fair  Judgement:  Fair  Insight:  Fair  Psychomotor Activity:  Normal  Concentration:  Concentration: Fair  Recall:  [employer] of Knowledge:  Fair  Language:  Fair  Akathisia:  Negative  Handed:  Right  AIMS (if indicated):     Assets:  Communication Skills Desire for Improvement Housing Leisure Time Resilience Social Support Talents/Skills  ADL's:  Intact  Cognition:  WNL  Sleep:  Number of Hours: 1.25       COGNITIVE FEATURES THAT CONTRIBUTE TO RISK:  None    SUICIDE RISK:   Severe: Although information thus far is contradictory we must err on the side of caution  PLAN OF CARE:   I certify that inpatient services furnished can reasonably be expected to improve the patient's condition.   Johnn Hai, MD 01/04/2019, 2:08 PM

## 2019-01-04 NOTE — Plan of Care (Signed)
  Problem: Coping: Goal: Ability to identify and develop effective coping behavior will improve Outcome: Progressing   D: Pt alert and oriented on the unit. Pt engaging with RN staff and other pts. Pt denies SI/HI, A/VH. Pt was irritable during the morning but cooperative and pleasant later in the afternoon. Pt was compliant with medications. A: Education, support and encouragement provided, q15 minute safety checks remain in effect. Medications administered per MD orders. R: No reactions/side effects to medicine noted. Pt denies any concerns at this time, and verbally contracts for safety. Pt ambulating on the unit with no issues. Pt remains safe on and off the unit.

## 2019-01-04 NOTE — Progress Notes (Signed)
Inpatient Diabetes Program Recommendations  AACE/ADA: New Consensus Statement on Inpatient Glycemic Control (2015)  Target Ranges:  Prepandial:   less than 140 mg/dL      Peak postprandial:   less than 180 mg/dL (1-2 hours)      Critically ill patients:  140 - 180 mg/dL   Lab Results  Component Value Date   GLUCAP 383 (H) 01/04/2019   HGBA1C 12.0 (H) 01/03/2019    Review of Glycemic Control Results for Jacob Brady, SCHLEYER (MRN [id number]) as of 01/04/2019 12:40  Ref. Range 01/03/2019 14:14 01/03/2019 18:16 01/03/2019 20:57 01/04/2019 05:55 01/04/2019 12:03  Glucose-Capillary Latest Ref Range: 70 - 99 mg/dL 300 (H) 362 (H) 349 (H) 245 (H) 383 (H)   Diabetes history: DM2 ? Prior hx Outpatient Diabetes medications: None listed Current orders for Inpatient glycemic control: Metformin 500 mg bid + Novolog sensitive tid + hs  Inpatient Diabetes Program Recommendations:   Noted A1c 12.0 which indicates poor DM control-also ?prior dx DM. -Hospitalist consult -Start basal insulin Lantus 25 units daily -Novolog 5 units tid meal coverage if patient appropriate for multiple injections -Change hs correction to 0-5 units  Thank you, Bethena Roys E. Cambryn Charters, RN, MSN, CDE  Diabetes Coordinator Inpatient Glycemic Control Team Team Pager [phone] (8am-5pm) 01/04/2019 12:44 PM

## 2019-01-04 NOTE — [facility]HH Suicide Risk Assessment (Signed)
[place] INPATIENT:  Family/Significant Other Suicide Prevention Education  Suicide Prevention Education:  Patient Refusal for Family/Significant Other Suicide Prevention Education: The patient Jacob Brady has refused to provide written consent for family/significant other to be provided Family/Significant Other Suicide Prevention Education during admission and/or prior to discharge.  Physician notified.   Declines consents, denies SI and HI.  Joellen Jersey 01/04/2019, 10:51 AM

## 2019-01-04 NOTE — Progress Notes (Signed)
D:  Patient reports that he is adjusting to the unit, but hasn't slept in 4 days.  Patient denies thoughts of hurting himself or others.  He reports that Trazodone now makes him jumpy and some of his medications that he has been on with the [place] also are making him jumpy and unable to sleep.  Seroquel helps him sleep, but he cannot take it due to elevated liver enzymes.  Vistaril given to help patient with anxiety and sleep with little to no relief.  Order obtained for Restoril 15mg  and it was given to patient.  Patient would not remain in bed and periodically would walk down the hallway and remain standing at dayroom window.  Patient was observed sitting on chair in hallway, snoring, but got up and walked down hallway after staff passed.  A:  Safety checks q 15 minutes. Emotional support provided.  Medications administered as ordered.  R:  Safety maintained on unit.   [place] NOVEL CORONAVIRUS (COVID-19) DAILY CHECK-OFF SYMPTOMS - answer yes or no to each - every day NO YES  Have you had a fever in the past 24 hours?  . Fever (Temp > 37.80C / 100F) X   Have you had any of these symptoms in the past 24 hours? . New Cough .  Sore Throat  .  Shortness of Breath .  Difficulty Breathing .  Unexplained Body Aches   X   Have you had any one of these symptoms in the past 24 hours not related to allergies?   . Runny Nose .  Nasal Congestion .  Sneezing   X   If you have had runny nose, nasal congestion, sneezing in the past 24 hours, has it worsened?  X   EXPOSURES - check yes or no X   Have you traveled outside the state in the past 14 days?  X   Have you been in contact with someone with a confirmed diagnosis of COVID-19 or PUI in the past 14 days without wearing appropriate PPE?  X   Have you been living in the same home as a person with confirmed diagnosis of COVID-19 or a PUI (household contact)?    X   Have you been diagnosed with COVID-19?    X              What to do next: Answered  NO to all: Answered YES to anything:   Proceed with unit schedule Follow the [facility] Inpatient Flowsheet.

## 2019-01-04 NOTE — Progress Notes (Signed)
Patient ID: [name], male   DOB: 12/24/1960, 57 y.o.   MRN: [id number]  [place] NOVEL CORONAVIRUS (COVID-19) DAILY CHECK-OFF SYMPTOMS - answer yes or no to each - every day NO YES  Have you had a fever in the past 24 hours?  . Fever (Temp > 37.80C / 100F) X   Have you had any of these symptoms in the past 24 hours? . New Cough .  Sore Throat  .  Shortness of Breath .  Difficulty Breathing .  Unexplained Body Aches   X   Have you had any one of these symptoms in the past 24 hours not related to allergies?   . Runny Nose .  Nasal Congestion .  Sneezing   X   If you have had runny nose, nasal congestion, sneezing in the past 24 hours, has it worsened?  X   EXPOSURES - check yes or no X   Have you traveled outside the state in the past 14 days?  X   Have you been in contact with someone with a confirmed diagnosis of COVID-19 or PUI in the past 14 days without wearing appropriate PPE?  X   Have you been living in the same home as a person with confirmed diagnosis of COVID-19 or a PUI (household contact)?    X   Have you been diagnosed with COVID-19?    X              What to do next: Answered NO to all: Answered YES to anything:   Proceed with unit schedule Follow the [facility] Inpatient Flowsheet.    

## 2019-01-04 NOTE — H&P (Signed)
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 is a 58 year old patient who has received healthcare through the [facility] previously, he presented yesterday initially complaining of insomnia, suicidality, and he had actually been on the phone with his sister when he expressed, by his report, the severity of his depression which prompted a wellness check from law enforcement.  The patient spends our interview lobbying for discharge insisting that he is been misunderstood completely and he would like to leave immediately.  He states that if he leaves today he can pick up insulin and needles and supplies like this from the [facility] -when I ask if I can phone his sister he states he does not know the telephone number however he acknowledged that he had phoned her prior to coming in the hospital.  Further, the report that he was writing notes for his daughter and sister and was planning on hanging himself and put some type of noose around his neck, which was documented yesterday as an acute event, he states that happened "3 years ago" and it is not current. There is another comment that he stated that he would be better off dead rather than have this insomnia further there is another statement that he had made documented that he had access to a gun at home and that would be quicker something to that effect and he again tells me that this is not true.  He elaborates that he simply was asked if he had access to firearms and he told them he did not have one at home but did have access to  one.  Thus the patient contradicts the medical record thus far, lobbyist for discharge continually, but does state that he quit his venlafaxine within the past 2 weeks and he suffered from severe insomnia and when I describe other symptoms of venlafaxine withdrawal he states that he has had them as well to include the electric type shocks and anxiety.  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  He states Latuda did the same thing.   Associated Signs/Symptoms: Depression Symptoms:  insomnia, (Hypo) Manic Symptoms:  Distractibility, Anxiety Symptoms:  Panic Symptoms, Psychotic Symptoms:  n/a PTSD Symptoms: NA Total Time spent with patient: 45 minutes  Past Psychiatric History:  Last admission was nonpsychiatric that was 3 years ago he was diagnosed with pancreatitis, EKG abnormalities, essential hypertension, type 2 diabetes, PTSD, abdominal aortic aneurysm.  Is the patient at risk to self? Yes.    Has the patient been a risk to self in the past 6 months? No.  Has the patient been a risk to self within the distant past? Yes.    Is the patient a risk to others? No.  Has the patient been a risk to others in the past 6 months? No.  Has the patient been a risk to others within the distant past? No.   Prior Inpatient Therapy:  Through the [facility] Prior Outpatient Therapy:    Alcohol Screening: 1. How often do you have a drink containing alcohol?: Never 2. How many drinks containing alcohol do you  have on a typical day when you are drinking?: 1 or 2 3. How often do you have six or more drinks on one occasion?: Never AUDIT-C Score: 0 9. Have you or someone else been injured as a result of your drinking?: No 10. Has a relative or friend or a doctor or another health worker been concerned about your drinking or suggested you cut down?: No Alcohol Use Disorder Identification Test Final Score (AUDIT): 0 Alcohol  Brief Interventions/Follow-up: Brief Advice Substance Abuse History in the last 12 months:  No. Consequences of Substance Abuse: NA Previous Psychotropic Medications: Yes  Psychological Evaluations: No  Past Medical History:  Past Medical History:  Diagnosis Date  . Arthritis   . Diabetes mellitus without complication (HCC)   . Hypertension   . PTSD (post-traumatic stress disorder)     Past Surgical History:  Procedure Laterality Date  . CHOLECYSTECTOMY    . WRIST FRACTURE SURGERY Right    Family History: History reviewed. No pertinent family history. Family Psychiatric  History: ukn Tobacco Screening: Have you used any form of tobacco in the last 30 days? (Cigarettes, Smokeless Tobacco, Cigars, and/or Pipes): Yes Tobacco use, Select all that apply: 5 or more cigarettes per day Are you interested in Tobacco Cessation Medications?: Yes, will notify MD for an order Counseled patient on smoking cessation including recognizing danger situations, developing coping skills and basic information about quitting provided: Refused/Declined practical counseling Social History:  Social History   Substance and Sexual Activity  Alcohol Use No     Social History   Substance and Sexual Activity  Drug Use No    Additional Social History: Marital status: Divorced Divorced, when?: 21 years ago What types of issues is patient dealing with in the relationship?: n/a Additional relationship information: none Are you sexually active?: No What is your sexual orientation?: Straight Has your sexual activity been affected by drugs, alcohol, medication, or emotional stress?: Denies Does patient have children?: Yes How many children?: 1(1) How is patient's relationship with their children?: 56 year old daugher, close relationship. She lives in [place], they talk on the phone often and he visits her twice a year.                         Allergies:   Allergies  Allergen Reactions  .  Hctz [Hydrochlorothiazide] Other (See Comments)    Unknown, "they just told me not to take it anymore"  . Seroquel [Quetiapine Fumarate] Other (See Comments)    Elevates liver enzymes    Lab Results:  Results for orders placed or performed during the hospital encounter of 01/03/19 (from the past 48 hour(s))  Glucose, capillary     Status: Abnormal   Collection Time: 01/03/19  6:16 PM  Result Value Ref Range   Glucose-Capillary 362 (H) 70 - 99 mg/dL  Glucose, capillary     Status: Abnormal   Collection Time: 01/03/19  8:57 PM  Result Value Ref Range   Glucose-Capillary 349 (H) 70 - 99 mg/dL  Glucose, capillary     Status: Abnormal   Collection Time: 01/04/19  5:55 AM  Result Value Ref Range   Glucose-Capillary 245 (H) 70 - 99 mg/dL  Glucose, capillary     Status: Abnormal   Collection Time: 01/04/19 12:03 PM  Result Value Ref Range   Glucose-Capillary 383 (H) 70 - 99 mg/dL    Blood Alcohol level:  Lab Results  Component Value Date   ETH <10 01/03/2019  Metabolic Disorder Labs:  Lab Results  Component Value Date   HGBA1C 12.0 (H) 01/03/2019   MPG 297.7 01/03/2019   MPG 189 08/03/2015   No results found for: PROLACTIN Lab Results  Component Value Date   CHOL 449 (H) 01/03/2019   TRIG 3,631 (H) 01/03/2019   HDL NOT REPORTED DUE TO HIGH TRIGLYCERIDES 01/03/2019   CHOLHDL NOT REPORTED DUE TO HIGH TRIGLYCERIDES 01/03/2019   VLDL UNABLE TO CALCULATE IF TRIGLYCERIDE OVER 400 mg/dL 65/78/469606/04/2019   LDLCALC NOT CALCULATED 01/03/2019   LDLCALC 105 (H) 08/03/2015    Current Medications: Current Facility-Administered Medications  Medication Dose Route Frequency Provider Last Rate Last Dose  . amLODipine (NORVASC) tablet 10 mg  10 mg Oral Daily Rankin, Shuvon B, NP   10 mg at 01/04/19 0816   And  . benazepril (LOTENSIN) tablet 20 mg  20 mg Oral Daily Rankin, Shuvon B, NP   20 mg at 01/04/19 0816  . clonazePAM (KLONOPIN) tablet 2 mg  2 mg Oral QHS Malvin JohnsFarah, Lynard Postlewait, MD      .  hydrOXYzine (ATARAX/VISTARIL) tablet 25 mg  25 mg Oral TID PRN Rankin, Shuvon B, NP   25 mg at 01/03/19 2254  . ibuprofen (ADVIL) tablet 800 mg  800 mg Oral Q8H PRN Rankin, Shuvon B, NP   800 mg at 01/03/19 2254  . insulin aspart (novoLOG) injection 0-9 Units  0-9 Units Subcutaneous TID WC & HS Cobos, Rockey SituFernando A, MD   9 Units at 01/04/19 1207  . metFORMIN (GLUCOPHAGE) tablet 500 mg  500 mg Oral BID WC Rankin, Shuvon B, NP   500 mg at 01/04/19 0817  . mirtazapine (REMERON SOL-TAB) disintegrating tablet 15 mg  15 mg Oral QHS Malvin JohnsFarah, Ladale [place], MD      . nicotine (NICODERM CQ - dosed in mg/24 hours) patch 21 mg  21 mg Transdermal Daily Rankin, Shuvon B, NP   21 mg at 01/04/19 0816  . propranolol (INDERAL) tablet 40 mg  40 mg Oral Q4H PRN Malvin JohnsFarah, Netanel Yannuzzi, MD      . traZODone (DESYREL) tablet 50 mg  50 mg Oral QHS PRN Rankin, Shuvon B, NP      . [START ON 01/05/2019] venlafaxine XR (EFFEXOR-XR) 24 hr capsule 37.5 mg  37.5 mg Oral Q breakfast Malvin JohnsFarah, Kenda Kloehn, MD       PTA Medications: No medications prior to admission.  Drug screen negative alcohol level negligible  Musculoskeletal: Strength & Muscle Tone: within normal limits Gait & Station: normal Patient leans: N/A  Psychiatric Specialty Exam: Physical Exam hypertensive  ROS vascular-hypertension/history of EKG abnormality/neurological negative for seizures/GI/GU history of pancreatitis he reports in the absence of alcohol usage  Blood pressure (!) 167/98, pulse 87, temperature 98.6 F (37 C), temperature source Oral, resp. rate 18, height 5\' 10"  (1.778 m), weight 124.7 kg, SpO2 100 %.Body mass index is 39.46 kg/m.  General Appearance: Casual  Eye Contact:  Good  Speech:  Clear and Coherent  Volume:  Normal  Mood:  Euthymic  Affect:  Constricted  Thought Process:  Goal Directed and Descriptions of Associations: Intact  Orientation:  Full (Time, Place, and Person)  Thought Content:  Logical and Tangential  Suicidal Thoughts:  No  Homicidal  Thoughts:  No  Memory:  Immediate;   Fair  Judgement:  Fair  Insight:  Fair  Psychomotor Activity:  Normal  Concentration:  Concentration: Fair  Recall:  [employer]  Fund of Knowledge:  Fair  Language:  Fair  Akathisia:  Negative  Handed:  Right  AIMS (if indicated):     Assets:  Communication Skills Desire for Improvement Housing Leisure Time Resilience Social Support Talents/Skills  ADL's:  Intact  Cognition:  WNL  Sleep:  Number of Hours: 1.25    Treatment Plan Summary: Daily contact with patient to assess and evaluate symptoms and progress in treatment and Medication management  Observation Level/Precautions:  15 minute checks  Laboratory:  UDS  Psychotherapy:    Medications:    Consultations:    Discharge Concerns:    Estimated LOS:  Other:    Axis I depression recurrent severe without psychosis  Patient is rather insistent upon discharge however his story is completely inconsistent as excellent clinicians have documented concerns about his safety and recent comments and behaviors that are indicating suicidality.  The patient says that he is been misunderstood.  Therefore we simply must monitor him for safety through the weekend. Further, there is no doubt he is gone through some venlafaxine withdrawal so we will introduce a low-dose of venlafaxine, add the meds for sleep to include mirtazapine and clonazepam.   Physician Treatment Plan for Primary Diagnosis: <principal problem not specified> Long Term Goal(s): Improvement in symptoms so as ready for discharge  Short Term Goals: Ability to disclose and discuss suicidal ideas, Ability to demonstrate self-control will improve, Ability to identify and develop effective coping behaviors will improve, Ability to maintain clinical measurements within normal limits will improve, Compliance with prescribed medications will improve and Ability to identify triggers associated with substance abuse/mental health issues will  improve  Physician Treatment Plan for Secondary Diagnosis: Active Problems:   MDD (major depressive disorder), recurrent episode, severe (HCC)  Long Term Goal(s): Improvement in symptoms so as ready for discharge  Short Term Goals: Ability to identify and develop effective coping behaviors will improve and Ability to maintain clinical measurements within normal limits will improve  I certify that inpatient services furnished can reasonably be expected to improve the patient's condition.    Malvin JohnsFARAH,Maurice Ramseur, MD 6/11/20201:59 PM

## 2019-01-04 NOTE — [facility] Counselor (Signed)
Adult Comprehensive Assessment  Patient ID: Jacob Brady, male   DOB: 1961/03/16, 58 y.o.   MRN: [id number]  Information Source: Information source: Patient  Current Stressors:  Patient states their primary concerns and needs for treatment are:: Couldn't sleep, wanted to get professional help and medication adjustments. Patient states their goals for this hospitilization and ongoing recovery are:: "I'm getting help now. I would like to discharge today." Educational / Learning stressors: Denies Employment / Job issues: Denies, disabled and volunteers Family Relationships: Denies stressors, talks often with his adult daughter. Close to siblings, two sisters relocated to [place] [employer] / Lack of resources (include bankruptcy): [employer], SSDI, [employer], [facility] service connected Housing / Lack of housing: Denies Physical health (include injuries & life threatening diseases): Can't sleep for the last 3 months due to tremors prior to sleep. Diabetic. Hx of PTSD and Bipolar Social relationships: Volunteers with veterans Substance abuse: Denies Bereavement / Loss: Denies  Living/Environment/Situation:  Living Arrangements: Alone Living conditions (as described by patient or guardian): Apartment in [place] Who else lives in the home?: Self How long has patient lived in current situation?: 5 years What is atmosphere in current home: Comfortable  Family History:  Marital status: Divorced Divorced, when?: 21 years ago What types of issues is patient dealing with in the relationship?: n/a Additional relationship information: none Are you sexually active?: No What is your sexual orientation?: Straight Has your sexual activity been affected by drugs, alcohol, medication, or emotional stress?: Denies Does patient have children?: Yes How many children?: 1(1) How is patient's relationship with their children?: 56 year old daugher, close relationship. She lives in [place], they talk on  the phone often and he visits her twice a year.  Childhood History:  By whom was/is the patient raised?: Both parents, Mother/father and step-parent Additional childhood history information: Both parents until age 58, then both parents remairred quickly afterward. Description of patient's relationship with caregiver when they were a child: Good Patient's description of current relationship with people who raised him/her: Both deceased, father in 201980's, mother in 2008 How were you disciplined when you got in trouble as a child/adolescent?: Appropriately Does patient have siblings?: Yes Number of Siblings: 4 Description of patient's current relationship with siblings: 3 sisters, 1 brother. Good relationships with all. Two sisters moved down to [place] and he sees them often at holidays. Did patient suffer any verbal/emotional/physical/sexual abuse as a child?: No Did patient suffer from severe childhood neglect?: No Has patient ever been sexually abused/assaulted/raped as an adolescent or adult?: No Was the patient ever a victim of a crime or a disaster?: No Witnessed domestic violence?: No Has patient been effected by domestic violence as an adult?: No  Education:  Highest grade of school patient has completed: Geneticist, molecularHigh School Diploma Currently a student?: No Learning disability?: No  Employment/Work Situation:   Employment situation: On disability Why is patient on disability: PTSD How long has patient been on disability: 10+ years Patient's job has been impacted by current illness: No What is the longest time patient has a held a job?: 12.5 years 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: Treatment for ETOH abuse while in [employer]. Current with [facility] for [facility] disorder and PTSD Are There Guns or Other Weapons in Your  Home?: No  Financial Resources:   Financial resources: Laverda Pageeceives SSDI, Medicare(VA Pension) Does patient have  a representative payee or guardian?: No  Alcohol/Substance Abuse:   What has been your use of drugs/alcohol within the last 12 months?: None Alcohol/Substance Abuse Treatment Hx: Past Tx, Inpatient, Past Tx, Outpatient If yes, describe treatment: Current with [facility] for outpatient. Prior behavioral health hospitalization from 2005 in [place] for a suicide attempt after he lost a job Has alcohol/substance abuse ever caused legal problems?: No  Social Support System:   Heritage manager System: Manufacturing engineer System: 75, daughter, sisters, friends Type of faith/religion: Catholic How does patient's faith help to cope with current illness?: Yes  Leisure/Recreation:   Leisure and Hobbies: Volunteering with veterans, drawing, painting, reading, talking to daughter  Strengths/Needs:   What is the patient's perception of their strengths?: Data processing manager, loving, people person, charitable Patient states they can use these personal strengths during their treatment to contribute to their recovery: yes Patient states these barriers may affect/interfere with their treatment: Wants to discharge today so [facility] Patient states these barriers may affect their return to the community: denies  Discharge Plan:   Currently receiving community mental health services: Yes (From Whom) Patient states concerns and preferences for aftercare planning are: [facility] [place] [facility], therapy, and primary care. Patient states they will know when they are safe and ready for discharge when: "I feel ready now" Does patient have access to transportation?: Yes(Usually takes a taxi) Does patient have financial barriers related to discharge medications?: No Patient description of barriers related  to discharge medications: Medicare, VA service connected, income from retirement Will patient be returning to same living situation after discharge?: Yes  Summary/Recommendations:   Summary and Recommendations (to be completed by the evaluator): Zekiel is a 58 year old male from [place] presenting voluntarily to [facility] from [place]. Patient reports seeking treatment for an inability to sleep, which he attributes to medication side effects. The patient endorses a history of bipolar disorder and PTSD. He is service connected with the [facility] in [place], where he will follow up at discharge. Patient is eager to discharge and denies stressors other than sleeping difficulties. Patient will benefit from therapeutic milieu, referral for services, medication management, and crisis stabilization.  Joellen Jersey. 01/04/2019

## 2019-01-04 NOTE — Progress Notes (Signed)
Pt was seen in dayroom in corner of room, no interaction with peers on initial approach. Pt reports his day as a "9" but no goal for the day. Pt states that he had a hard time sleeping last night, and wants to try to sleep well tonight. Pt appeared much more brighter tonight than previous night. Pt denies SI/HI or hallucinations (a) 15 min checks (r) safety maintained.  [place] NOVEL CORONAVIRUS (COVID-19) DAILY CHECK-OFF SYMPTOMS - answer yes or no to each - every day NO YES  Have you had a fever in the past 24 hours?  . Fever (Temp > 37.80C / 100F) X   Have you had any of these symptoms in the past 24 hours? . New Cough .  Sore Throat  .  Shortness of Breath .  Difficulty Breathing .  Unexplained Body Aches   X   Have you had any one of these symptoms in the past 24 hours not related to allergies?   . Runny Nose .  Nasal Congestion .  Sneezing   X   If you have had runny nose, nasal congestion, sneezing in the past 24 hours, has it worsened?  X   EXPOSURES - check yes or no X   Have you traveled outside the state in the past 14 days?  X   Have you been in contact with someone with a confirmed diagnosis of COVID-19 or PUI in the past 14 days without wearing appropriate PPE?  X   Have you been living in the same home as a person with confirmed diagnosis of COVID-19 or a PUI (household contact)?    X   Have you been diagnosed with COVID-19?    X              What to do next: Answered NO to all: Answered YES to anything:   Proceed with unit schedule Follow the [facility] Inpatient Flowsheet.

## 2019-01-05 DIAGNOSIS — F332 Major depressive disorder, recurrent severe without psychotic features: Principal | ICD-10-CM

## 2019-01-05 LAB — GLUCOSE, CAPILLARY
Glucose-Capillary: 186 mg/dL — ABNORMAL HIGH (ref 70–99)
Glucose-Capillary: 301 mg/dL — ABNORMAL HIGH (ref 70–99)
Glucose-Capillary: 282 mg/dL — ABNORMAL HIGH (ref 70–99)
Glucose-Capillary: 229 mg/dL — ABNORMAL HIGH (ref 70–99)

## 2019-01-05 MED ORDER — INSULIN ASPART 100 UNIT/ML ~~LOC~~ SOLN
5.0000 [IU] | Freq: Three times a day (TID) | SUBCUTANEOUS | Status: DC
  Administered 2019-01-05 – 2019-01-06 (×3): 5 [IU] via SUBCUTANEOUS

## 2019-01-05 MED ORDER — INSULIN GLARGINE 100 UNIT/ML ~~LOC~~ SOLN
25.0000 [IU] | Freq: Every day | SUBCUTANEOUS | Status: DC
  Administered 2019-01-05: 25 [IU] via SUBCUTANEOUS

## 2019-01-05 MED ORDER — IBUPROFEN 400 MG PO TABS
400.0000 mg | ORAL_TABLET | Freq: Four times a day (QID) | ORAL | Status: DC | PRN
  Administered 2019-01-05 – 2019-01-08 (×3): 400 mg via ORAL
  Filled 2019-01-05 (×2): qty 1

## 2019-01-05 NOTE — Progress Notes (Signed)
Patient rated his day as a 10 out of a possible 10 but would not share anything. He was abrupt with this Chief Strategy Officer. His goal for tomorrow is "not much".

## 2019-01-05 NOTE — Progress Notes (Addendum)
[facility]  01/05/2019 1:16 PM Jacob Brady  MRN:  [id number] Subjective:  "I'm pretty good."  Jacob Brady found asleep in bed, snoring loudly. He reports stable mood this morning and denies SI. Per nursing notes, he was found walking in the hallways last night and was on the floor at one point. Patient reports he started to feel unsteady on his feet while walking overnight and grabbed the side rail and slid down to the floor. He denies any injury or pain. He reports better sleep than usual last night, but states he still feels the need to get up overnight due to years of work [employer], when he was frequently awoken for work overnight. Denies PTSD symptoms, nightmares or flashbacks. He was started on Remeron last night and reports it seemed to help with sleep. Patient denies prior diagnosis of sleep apnea but reports that he does have upcoming sleep study at the [facility]. Patient was encouraged to comply with study and recommendations, due to snoring and sleep disturbances. Patient states understanding. He reports side effects of restlessness and difficulty sleeping with the Effexor. He states he would rather take Remeron alone at this point. He identifies insomnia as primary trigger for his recent depression.  He reports recently being diagnosed with diabetes. Denies prior medication for diabetes at home. Patient educated on importance of carb consistent diet. He was started on metformin 500 mg BID yesterday. Morning CBG 186 but on recheck was 364 after breakfast. Diabetes coordinator recommends Lantus 25 units daily and Novolog 5 units meal coverage along with sliding scale insulin.  Principal Problem: <principal problem not specified> Diagnosis: Active Problems:   MDD (major depressive disorder), recurrent episode, severe (HCC)  Total Time spent with patient: 20 minutes  Past Psychiatric History: See admission H&P  Past Medical History:  Past Medical History:  Diagnosis Date  .  Arthritis   . Diabetes mellitus without complication (HCC)   . Hypertension   . PTSD (post-traumatic stress disorder)     Past Surgical History:  Procedure Laterality Date  . CHOLECYSTECTOMY    . WRIST FRACTURE SURGERY Right    Family History: History reviewed. No pertinent family history. Family Psychiatric  History: See admission H&P Social History:  Social History   Substance and Sexual Activity  Alcohol Use No     Social History   Substance and Sexual Activity  Drug Use No    Social History   Socioeconomic History  . Marital status: Divorced    Spouse name: Not on file  . Number of children: Not on file  . Years of education: Not on file  . Highest education level: Not on file  Occupational History  . Not on file  Social Needs  . Financial resource strain: Not on file  . Food insecurity    Worry: Not on file    Inability: Not on file  . Transportation needs    Medical: Not on file    Non-medical: Not on file  Tobacco Use  . Smoking status: Current Every Day Smoker  . Smokeless tobacco: Never Used  Substance and Sexual Activity  . Alcohol use: No  . Drug use: No  . Sexual activity: Not Currently  Lifestyle  . Physical activity    Days per week: Not on file    Minutes per session: Not on file  . Stress: Not on file  Relationships  . Social connections    Talks on phone: Not on file  Gets together: Not on file    Attends religious service: Not on file    Active member of club or organization: Not on file    Attends meetings of clubs or organizations: Not on file    Relationship status: Not on file  Other Topics Concern  . Not on file  Social History Narrative  . Not on file   Additional Social History:                         Sleep: Fair  Appetite:  Good  Current Medications: Current Facility-Administered Medications  Medication Dose Route Frequency Provider Last Rate Last Dose  . amLODipine (NORVASC) tablet 10 mg  10 mg Oral  Daily Rankin, Shuvon B, NP   10 mg at 01/05/19 0835   And  . benazepril (LOTENSIN) tablet 20 mg  20 mg Oral Daily Rankin, Shuvon B, NP   20 mg at 01/05/19 0835  . clonazePAM (KLONOPIN) tablet 2 mg  2 mg Oral QHS Malvin JohnsFarah, Brian, MD   2 mg at 01/04/19 2037  . hydrOXYzine (ATARAX/VISTARIL) tablet 25 mg  25 mg Oral TID PRN Rankin, Shuvon B, NP   25 mg at 01/03/19 2254  . ibuprofen (ADVIL) tablet 800 mg  800 mg Oral Q8H PRN Rankin, Shuvon B, NP   800 mg at 01/04/19 1434  . insulin aspart (novoLOG) injection 0-9 Units  0-9 Units Subcutaneous TID WC & HS Cobos, Rockey SituFernando A, MD   7 Units at 01/05/19 1201  . insulin aspart (novoLOG) injection 5 Units  5 Units Subcutaneous TID WC Aldean BakerSykes, Janet E, NP   5 Units at 01/05/19 1200  . insulin glargine (LANTUS) injection 25 Units  25 Units Subcutaneous Daily Aldean BakerSykes, Janet E, NP   25 Units at 01/05/19 1025  . metFORMIN (GLUCOPHAGE) tablet 500 mg  500 mg Oral BID WC Rankin, Shuvon B, NP   500 mg at 01/05/19 0835  . mirtazapine (REMERON SOL-TAB) disintegrating tablet 15 mg  15 mg Oral QHS Malvin JohnsFarah, Brian, MD   15 mg at 01/04/19 2038  . nicotine (NICODERM CQ - dosed in mg/24 hours) patch 21 mg  21 mg Transdermal Daily Rankin, Shuvon B, NP   21 mg at 01/05/19 0840  . propranolol (INDERAL) tablet 40 mg  40 mg Oral Q4H PRN Malvin JohnsFarah, Brian, MD      . traZODone (DESYREL) tablet 50 mg  50 mg Oral QHS PRN Rankin, Shuvon B, NP        Lab Results:  Results for orders placed or performed during the hospital encounter of 01/03/19 (from the past 48 hour(s))  Glucose, capillary     Status: Abnormal   Collection Time: 01/03/19  6:16 PM  Result Value Ref Range   Glucose-Capillary 362 (H) 70 - 99 mg/dL  Glucose, capillary     Status: Abnormal   Collection Time: 01/03/19  8:57 PM  Result Value Ref Range   Glucose-Capillary 349 (H) 70 - 99 mg/dL  Glucose, capillary     Status: Abnormal   Collection Time: 01/04/19  5:55 AM  Result Value Ref Range   Glucose-Capillary 245 (H) 70 - 99 mg/dL   Glucose, capillary     Status: Abnormal   Collection Time: 01/04/19 12:03 PM  Result Value Ref Range   Glucose-Capillary 383 (H) 70 - 99 mg/dL  Glucose, capillary     Status: Abnormal   Collection Time: 01/04/19  5:02 PM  Result Value Ref Range  Glucose-Capillary 347 (H) 70 - 99 mg/dL  Glucose, capillary     Status: Abnormal   Collection Time: 01/04/19  8:07 PM  Result Value Ref Range   Glucose-Capillary 329 (H) 70 - 99 mg/dL  Glucose, capillary     Status: Abnormal   Collection Time: 01/05/19  6:24 AM  Result Value Ref Range   Glucose-Capillary 186 (H) 70 - 99 mg/dL  Glucose, capillary     Status: Abnormal   Collection Time: 01/05/19 11:55 AM  Result Value Ref Range   Glucose-Capillary 301 (H) 70 - 99 mg/dL    Blood Alcohol level:  Lab Results  Component Value Date   ETH <10 25/85/2778    Metabolic Disorder Labs: Lab Results  Component Value Date   HGBA1C 12.0 (H) 01/03/2019   MPG 297.7 01/03/2019   MPG 189 08/03/2015   No results found for: PROLACTIN Lab Results  Component Value Date   CHOL 449 (H) 01/03/2019   TRIG 3,631 (H) 01/03/2019   HDL NOT REPORTED DUE TO HIGH TRIGLYCERIDES 01/03/2019   CHOLHDL NOT REPORTED DUE TO HIGH TRIGLYCERIDES 01/03/2019   VLDL UNABLE TO CALCULATE IF TRIGLYCERIDE OVER 400 mg/dL 01/03/2019   LDLCALC NOT CALCULATED 01/03/2019   LDLCALC 105 (H) 08/03/2015    Physical Findings: AIMS: Facial and Oral Movements Muscles of Facial Expression: None, normal Lips and Perioral Area: None, normal Jaw: None, normal Tongue: None, normal,Extremity Movements Upper (arms, wrists, hands, fingers): None, normal Lower (legs, knees, ankles, toes): None, normal, Trunk Movements Neck, shoulders, hips: None, normal, Overall Severity Severity of abnormal movements (highest score from questions above): None, normal Incapacitation due to abnormal movements: None, normal Patient's awareness of abnormal movements (rate only patient's report): No  Awareness, Dental Status Current problems with teeth and/or dentures?: No Does patient usually wear dentures?: No  CIWA:    COWS:     Musculoskeletal: Strength & Muscle Tone: within normal limits Gait & Station: normal Patient leans: N/A  Psychiatric Specialty Exam: Physical Exam  Nursing note and vitals reviewed. Constitutional: He is oriented to person, place, and time. He appears well-developed and well-nourished.  Cardiovascular: Normal rate.  Respiratory: Effort normal.  Neurological: He is alert and oriented to person, place, and time.    Review of Systems  Constitutional: Negative.   Respiratory: Negative for cough and shortness of breath.   Cardiovascular: Negative for chest pain.  Gastrointestinal: Negative for diarrhea, nausea and vomiting.  Neurological: Negative for headaches.  Psychiatric/Behavioral: Positive for depression. Negative for hallucinations, substance abuse and suicidal ideas. The patient has insomnia. The patient is not nervous/anxious.     Blood pressure (!) 152/107, pulse 87, temperature 97.7 F (36.5 C), temperature source Oral, resp. rate 18, height 5\' 10"  (1.778 m), weight 124.7 kg, SpO2 99 %.Body mass index is 39.46 kg/m.  General Appearance: Fairly Groomed  Eye Contact:  Good  Speech:  Clear and Coherent  Volume:  Normal  Mood:  Depressed  Affect:  Appropriate and Congruent  Thought Process:  Coherent  Orientation:  Full (Time, Place, and Person)  Thought Content:  Logical  Suicidal Thoughts:  No  Homicidal Thoughts:  No  Memory:  Immediate;   Fair Recent;   Fair  Judgement:  Intact  Insight:  Fair  Psychomotor Activity:  Normal  Concentration:  Concentration: Fair  Recall:  [employer] of Knowledge:  Fair  Language:  Good  Akathisia:  No  Handed:  Right  AIMS (if indicated):     Assets:  Communication  Skills Desire for Improvement Housing Social Support  ADL's:  Intact  Cognition:  WNL  Sleep:  Number of Hours: 4      Treatment Plan Summary: Daily contact with patient to assess and evaluate symptoms and progress in treatment and Medication management   Continue inpatient hospitalization.  Discontinue Effexor Continue Remeron 15 mg PO QHS for mood/sleep Continue Norvasc 10 mg PO daily for HTN Continue benazepril 20 mg PO daily for HTN Continue propanolol 40 mg PO Q4HR PRN DBP>90 Continue Vistaril 25 mg PO TID PRN anxiety Starting Novolog 5 units SQ AC and Lantus 25 units SQ daily for diabetes, per diabetes coordinator recommendations. Consult requested with hospitalist as well.  Patient will participate in the therapeutic group milieu.  Discharge disposition in progress. Consult with CSW for PCP (HTN, diabetes, sleep study) follow up as well as behavioral health.  Aldean BakerJanet E Sykes, NP 01/05/2019, 1:16 PM   Agree with NP progress note

## 2019-01-05 NOTE — Progress Notes (Signed)
Patient ID: Jacob Brady, male   DOB: 20-Sep-1960, 58 y.o.   MRN: [id number]  Nursing Progress Note [id number]  Patient presents anxious this morning and is seen with restless behavior. Patient compliant with scheduled medications and is educated about his insulin. Patient also educated about healthy blood sugar levels after he stated "300 is a good reading" and instructed about how to reduce sugar intake in his diet. Patient states his ex-wife was a diabetic and verbalizes understanding of information provided. Patient was observed injecting insulin properly with supervision of Probation officer. Patient is seen attending groups and visible in the milieu. Patient currently denies SI/HI/AVH.   Patient is educated about and provided medication per provider's orders. Patient safety maintained with q15 min safety checks and high fall risk precautions. Emotional support given, 1:1 interaction, and active listening provided. Patient encouraged to attend meals, groups, and work on treatment plan and goals. Labs, vital signs and patient behavior monitored throughout shift.   Patient contracts for safety with staff. Patient remains safe on the unit at this time and agrees to come to staff with any issues/concerns. Will continue to support and monitor.

## 2019-01-05 NOTE — Progress Notes (Signed)
Pt still sleeping off/on throughout the night. Told this Probation officer that his mind feels asleep, but his body is needing him to be up and walk. Pt has been irritable when encouraged to lay back down and encourage sleep. Pt appears tired, and was observed sitting on side of bed slumped forward asleep, awakens easily. Pt currently in room laying on bed. Safety maintained.

## 2019-01-05 NOTE — Tx Team (Signed)
Interdisciplinary Treatment and Diagnostic Plan Update  01/05/2019 Time of Session: 9:33am Jacob Brady MRN: [id number]  Principal Diagnosis: <principal problem not specified>  Secondary Diagnoses: Active Problems:   MDD (major depressive disorder), recurrent episode, severe (HCC)   Current Medications:  Current Facility-Administered Medications  Medication Dose Route Frequency Provider Last Rate Last Dose  . amLODipine (NORVASC) tablet 10 mg  10 mg Oral Daily Rankin, Shuvon B, NP   10 mg at 01/05/19 0835   And  . benazepril (LOTENSIN) tablet 20 mg  20 mg Oral Daily Rankin, Shuvon B, NP   20 mg at 01/05/19 0835  . clonazePAM (KLONOPIN) tablet 2 mg  2 mg Oral QHS Malvin JohnsFarah, Brian, MD   2 mg at 01/04/19 2037  . hydrOXYzine (ATARAX/VISTARIL) tablet 25 mg  25 mg Oral TID PRN Rankin, Shuvon B, NP   25 mg at 01/03/19 2254  . ibuprofen (ADVIL) tablet 800 mg  800 mg Oral Q8H PRN Rankin, Shuvon B, NP   800 mg at 01/04/19 1434  . insulin aspart (novoLOG) injection 0-9 Units  0-9 Units Subcutaneous TID WC & HS Cobos, Rockey SituFernando A, MD   2 Units at 01/05/19 0836  . insulin aspart (novoLOG) injection 5 Units  5 Units Subcutaneous TID WC Aldean BakerSykes, Janet E, NP      . insulin glargine (LANTUS) injection 25 Units  25 Units Subcutaneous Daily Aldean BakerSykes, Janet E, NP   25 Units at 01/05/19 1025  . metFORMIN (GLUCOPHAGE) tablet 500 mg  500 mg Oral BID WC Rankin, Shuvon B, NP   500 mg at 01/05/19 0835  . mirtazapine (REMERON SOL-TAB) disintegrating tablet 15 mg  15 mg Oral QHS Malvin JohnsFarah, Brian, MD   15 mg at 01/04/19 2038  . nicotine (NICODERM CQ - dosed in mg/24 hours) patch 21 mg  21 mg Transdermal Daily Rankin, Shuvon B, NP   21 mg at 01/05/19 0840  . propranolol (INDERAL) tablet 40 mg  40 mg Oral Q4H PRN Malvin JohnsFarah, Brian, MD      . traZODone (DESYREL) tablet 50 mg  50 mg Oral QHS PRN Rankin, Shuvon B, NP       PTA Medications: No medications prior to admission.    Patient Stressors:    Patient Strengths:    Treatment  Modalities: Medication Management, Group therapy, Case management,  1 to 1 session with clinician, Psychoeducation, Recreational therapy.   Physician Treatment Plan for Primary Diagnosis: <principal problem not specified> Long Term Goal(s): Improvement in symptoms so as ready for discharge Improvement in symptoms so as ready for discharge   Short Term Goals: Ability to disclose and discuss suicidal ideas Ability to demonstrate self-control will improve Ability to identify and develop effective coping behaviors will improve Ability to maintain clinical measurements within normal limits will improve Compliance with prescribed medications will improve Ability to identify triggers associated with substance abuse/mental health issues will improve Ability to identify and develop effective coping behaviors will improve Ability to maintain clinical measurements within normal limits will improve  Medication Management: Evaluate patient's response, side effects, and tolerance of medication regimen.  Therapeutic Interventions: 1 to 1 sessions, Unit Group sessions and Medication administration.  Evaluation of Outcomes: Progressing  Physician Treatment Plan for Secondary Diagnosis: Active Problems:   MDD (major depressive disorder), recurrent episode, severe (HCC)  Long Term Goal(s): Improvement in symptoms so as ready for discharge Improvement in symptoms so as ready for discharge   Short Term Goals: Ability to disclose and discuss suicidal ideas Ability to demonstrate self-control  will improve Ability to identify and develop effective coping behaviors will improve Ability to maintain clinical measurements within normal limits will improve Compliance with prescribed medications will improve Ability to identify triggers associated with substance abuse/mental health issues will improve Ability to identify and develop effective coping behaviors will improve Ability to maintain clinical  measurements within normal limits will improve     Medication Management: Evaluate patient's response, side effects, and tolerance of medication regimen.  Therapeutic Interventions: 1 to 1 sessions, Unit Group sessions and Medication administration.  Evaluation of Outcomes: Progressing   RN Treatment Plan for Primary Diagnosis: <principal problem not specified> Long Term Goal(s): Knowledge of disease and therapeutic regimen to maintain health will improve  Short Term Goals: Ability to participate in decision making will improve, Ability to verbalize feelings will improve, Ability to disclose and discuss suicidal ideas, Ability to identify and develop effective coping behaviors will improve and Compliance with prescribed medications will improve  Medication Management: RN will administer medications as ordered by provider, will assess and evaluate patient's response and provide education to patient for prescribed medication. RN will report any adverse and/or side effects to prescribing provider.  Therapeutic Interventions: 1 on 1 counseling sessions, Psychoeducation, Medication administration, Evaluate responses to treatment, Monitor vital signs and CBGs as ordered, Perform/monitor CIWA, COWS, AIMS and Fall Risk screenings as ordered, Perform wound care treatments as ordered.  Evaluation of Outcomes: Progressing   LCSW Treatment Plan for Primary Diagnosis: <principal problem not specified> Long Term Goal(s): Safe transition to appropriate next level of care at discharge, Engage patient in therapeutic group addressing interpersonal concerns.  Short Term Goals: Engage patient in aftercare planning with referrals and resources and Increase skills for wellness and recovery  Therapeutic Interventions: Assess for all discharge needs, 1 to 1 time with Social worker, Explore available resources and support systems, Assess for adequacy in community support network, Educate family and significant  other(s) on suicide prevention, Complete Psychosocial Assessment, Interpersonal group therapy.  Evaluation of Outcomes: Progressing   Progress in Treatment: Attending groups: No. Participating in groups: No. Taking medication as prescribed: Yes. Toleration medication: Yes. Family/Significant other contact made: No, will contact:  pt denied Patient understands diagnosis: Yes. Discussing patient identified problems/goals with staff: Yes. Medical problems stabilized or resolved: Yes. Denies suicidal/homicidal ideation: Yes. Issues/concerns per patient self-inventory: No. Other:   New problem(s) identified: No, Describe:  None  New Short Term/Long Term Goal(s):  Patient Goals:  "Steady mental health equality"  Discharge Plan or Barriers:   Reason for Continuation of Hospitalization: Aggression Medication stabilization  Estimated Length of Stay: 2-3 days  Attendees: Patient: 01/05/2019   Physician: Dr. Neita Garnet, MD 01/05/2019  Nursing: Estill Bamberg, RN 01/05/2019   RN Care Manager: 01/05/2019   Social Worker: Ardelle Anton, LCSW 01/05/2019   Recreational Therapist:  01/05/2019   Other:  01/05/2019   Other:  01/05/2019   Other: 01/05/2019      Scribe for Treatment Team: Trecia Rogers, LCSW 01/05/2019 10:40 AM

## 2019-01-05 NOTE — Progress Notes (Signed)
Patient ID: [name], male   DOB: 03/27/1961, 57 y.o.   MRN: [id number]  [facility] NOVEL CORONAVIRUS (COVID-19) DAILY CHECK-OFF SYMPTOMS - answer yes or no to each - every day NO YES  Have you had a fever in the past 24 hours?  . Fever (Temp > 37.80C / 100F) X   Have you had any of these symptoms in the past 24 hours? . New Cough .  Sore Throat  .  Shortness of Breath .  Difficulty Breathing .  Unexplained Body Aches   X   Have you had any one of these symptoms in the past 24 hours not related to allergies?   . Runny Nose .  Nasal Congestion .  Sneezing   X   If you have had runny nose, nasal congestion, sneezing in the past 24 hours, has it worsened?  X   EXPOSURES - check yes or no X   Have you traveled outside the state in the past 14 days?  X   Have you been in contact with someone with a confirmed diagnosis of COVID-19 or PUI in the past 14 days without wearing appropriate PPE?  X   Have you been living in the same home as a person with confirmed diagnosis of COVID-19 or a PUI (household contact)?    X   Have you been diagnosed with COVID-19?    X              What to do next: Answered NO to all: Answered YES to anything:   Proceed with unit schedule Follow the [facility] Inpatient Flowsheet.    

## 2019-01-05 NOTE — Progress Notes (Addendum)
Pt has not been remaining in his bed tonight, and constantly up walking in the hallway. Pt was seen in hallway on floor on left side of body. Pt reports that he felt like he was going to fall, so slide down on his left side and caught himself on his left hand. This was unwitnessed by this Probation officer, no injuries noted. Pt reports that he was in the [employer] for 4 years, and it taught him to be ready for anything, and "know what is going on around him." Pt states he has not slept in 4 days, and not sleeping at home either. Pt encouraged by multiple staff members to stay in bed, and encouraged rest. Pt made a high fall risk, notified charge rn. Safety maintained.

## 2019-01-06 LAB — GLUCOSE, CAPILLARY
Glucose-Capillary: 254 mg/dL — ABNORMAL HIGH (ref 70–99)
Glucose-Capillary: 240 mg/dL — ABNORMAL HIGH (ref 70–99)
Glucose-Capillary: 281 mg/dL — ABNORMAL HIGH (ref 70–99)
Glucose-Capillary: 213 mg/dL — ABNORMAL HIGH (ref 70–99)

## 2019-01-06 MED ORDER — INSULIN GLARGINE 100 UNIT/ML ~~LOC~~ SOLN
30.0000 [IU] | Freq: Every day | SUBCUTANEOUS | Status: DC
  Administered 2019-01-06 – 2019-01-08 (×3): 30 [IU] via SUBCUTANEOUS

## 2019-01-06 MED ORDER — INSULIN ASPART 100 UNIT/ML ~~LOC~~ SOLN
6.0000 [IU] | Freq: Three times a day (TID) | SUBCUTANEOUS | Status: DC
  Administered 2019-01-06 – 2019-01-08 (×7): 6 [IU] via SUBCUTANEOUS

## 2019-01-06 MED ORDER — ALUM & MAG HYDROXIDE-SIMETH 200-200-20 MG/5ML PO SUSP
15.0000 mL | Freq: Four times a day (QID) | ORAL | Status: DC | PRN
  Administered 2019-01-06: 15 mL via ORAL

## 2019-01-06 NOTE — Progress Notes (Signed)
Adult Psychoeducational Group Note  Date:  01/06/2019 Time:  1300  Group Topic/Focus:  Identifying Needs:   The focus of this group is to help patients identify their personal needs that have been historically problematic and identify healthy behaviors to address their needs.  Participation Level:  Did Not Attend  Participation Quality:    Affect:    Cognitive:    Insight:   Engagement in Group:    Modes of Intervention:    Additional Comments:    Lauralyn Primes 01/06/2019, 5:00 PM

## 2019-01-06 NOTE — [facility] Group Notes (Signed)
LCSW Group Therapy Note  01/06/2019   10:00-11:00am   Type of Therapy and Topic:  Group Therapy: Anger Cues and Responses  Participation Level:  Active   Description of Group:   In this group, patients learned how to recognize the physical, cognitive, emotional, and behavioral responses they have to anger-provoking situations.  They identified a recent time they became angry and how they reacted.  They analyzed how their reaction was possibly beneficial and how it was possibly unhelpful.  The group discussed a variety of healthier coping skills that could help with such a situation in the future.  Deep breathing was practiced briefly.  Therapeutic Goals: 1. Patients will remember their last incident of anger and how they felt emotionally and physically, what their thoughts were at the time, and how they behaved. 2. Patients will identify how their behavior at that time worked for them, as well as how it worked against them. 3. Patients will explore possible new behaviors to use in future anger situations. 4. Patients will learn that anger itself is normal and cannot be eliminated, and that healthier reactions can assist with resolving conflict rather than worsening situations.  Summary of Patient Progress:  The patient shared that his most recent time of anger was he became angry at his doctor at the [facility] for not prescribing an effective sleep medication and said he became frustrated and made threats to kill himself. He stated that part of the reason for the outburst is feeling sorry for himself and being lonely. He is motivated to acquire and use coping skills to help him is situations similar to this past one.  Therapeutic Modalities:   Cognitive Behavioral Therapy  Rolanda Jay

## 2019-01-06 NOTE — Progress Notes (Addendum)
[facility] MD Progress Note  01/06/2019 10:09 AM Jacob Brady  MRN:  [id number] Subjective: Patient reports partial improvement.  States "I feel like I am getting better".  He denies medication side effects at this time.  Denies suicidal ideations. Objective: I have met with patient and reviewed chart notes. 58 year old male, admitted due to depression, insomnia, suicidality. Currently patient reports improvement compared to admission and states he has been feeling better. He describes some lingering depression but overall states that his mood is "a lot better".  Denies any suicidal ideations. Describes gradually improving sleep.  Chart notes indicate patient was up/walking hallway late at night.  He states he has had long history of insomnia. Currently denies medication side effects.  Recently diagnosed diabetes mellitus.  CBG today 254  Principal Problem: Depression Diagnosis: Active Problems:   MDD (major depressive disorder), recurrent episode, severe (HCC)  Total Time spent with patient: 15 minutes  Past Psychiatric History: See admission H&P  Past Medical History:  Past Medical History:  Diagnosis Date  . Arthritis   . Diabetes mellitus without complication (HCC)   . Hypertension   . PTSD (post-traumatic stress disorder)     Past Surgical History:  Procedure Laterality Date  . CHOLECYSTECTOMY    . WRIST FRACTURE SURGERY Right    Family History: History reviewed. No pertinent family history. Family Psychiatric  History: See admission H&P Social History:  Social History   Substance and Sexual Activity  Alcohol Use No     Social History   Substance and Sexual Activity  Drug Use No    Social History   Socioeconomic History  . Marital status: Divorced    Spouse name: Not on file  . Number of children: Not on file  . Years of education: Not on file  . Highest education level: Not on file  Occupational History  . Not on file  Social Needs  . Financial resource strain:  Not on file  . Food insecurity    Worry: Not on file    Inability: Not on file  . Transportation needs    Medical: Not on file    Non-medical: Not on file  Tobacco Use  . Smoking status: Current Every Day Smoker  . Smokeless tobacco: Never Used  Substance and Sexual Activity  . Alcohol use: No  . Drug use: No  . Sexual activity: Not Currently  Lifestyle  . Physical activity    Days per week: Not on file    Minutes per session: Not on file  . Stress: Not on file  Relationships  . Social Musician on phone: Not on file    Gets together: Not on file    Attends religious service: Not on file    Active member of club or organization: Not on file    Attends meetings of clubs or organizations: Not on file    Relationship status: Not on file  Other Topics Concern  . Not on file  Social History Narrative  . Not on file   Additional Social History:   Sleep: Fair  Appetite:  Good  Current Medications: Current Facility-Administered Medications  Medication Dose Route Frequency Provider Last Rate Last Dose  . amLODipine (NORVASC) tablet 10 mg  10 mg Oral Daily Rankin, Shuvon B, NP   10 mg at 01/06/19 0921   And  . benazepril (LOTENSIN) tablet 20 mg  20 mg Oral Daily Rankin, Shuvon B, NP   20 mg at 01/06/19 0921  .  clonazePAM (KLONOPIN) tablet 2 mg  2 mg Oral QHS Malvin Johns, MD   2 mg at 01/05/19 2103  . hydrOXYzine (ATARAX/VISTARIL) tablet 25 mg  25 mg Oral TID PRN Rankin, Shuvon B, NP   25 mg at 01/05/19 2137  . ibuprofen (ADVIL) tablet 400 mg  400 mg Oral Q6H PRN Aldean Baker, NP   400 mg at 01/05/19 1513  . insulin aspart (novoLOG) injection 0-9 Units  0-9 Units Subcutaneous TID WC & HS Jenner Rosier, Rockey Situ, MD   5 Units at 01/06/19 [phone]  . insulin aspart (novoLOG) injection 6 Units  6 Units Subcutaneous TID WC Zannie Cove, MD      . insulin glargine (LANTUS) injection 30 Units  30 Units Subcutaneous Daily Zannie Cove, MD   30 Units at 01/06/19 0932  .  metFORMIN (GLUCOPHAGE) tablet 500 mg  500 mg Oral BID WC Rankin, Shuvon B, NP   500 mg at 01/06/19 0920  . mirtazapine (REMERON SOL-TAB) disintegrating tablet 15 mg  15 mg Oral QHS Malvin Johns, MD   15 mg at 01/05/19 2103  . nicotine (NICODERM CQ - dosed in mg/24 hours) patch 21 mg  21 mg Transdermal Daily Rankin, Shuvon B, NP   21 mg at 01/06/19 0924  . propranolol (INDERAL) tablet 40 mg  40 mg Oral Q4H PRN Malvin Johns, MD      . traZODone (DESYREL) tablet 50 mg  50 mg Oral QHS PRN Rankin, Shuvon B, NP   50 mg at 01/05/19 2137    Lab Results:  Results for orders placed or performed during the hospital encounter of 01/03/19 (from the past 48 hour(s))  Glucose, capillary     Status: Abnormal   Collection Time: 01/04/19 12:03 PM  Result Value Ref Range   Glucose-Capillary 383 (H) 70 - 99 mg/dL  Glucose, capillary     Status: Abnormal   Collection Time: 01/04/19  5:02 PM  Result Value Ref Range   Glucose-Capillary 347 (H) 70 - 99 mg/dL  Glucose, capillary     Status: Abnormal   Collection Time: 01/04/19  8:07 PM  Result Value Ref Range   Glucose-Capillary 329 (H) 70 - 99 mg/dL  Glucose, capillary     Status: Abnormal   Collection Time: 01/05/19  6:24 AM  Result Value Ref Range   Glucose-Capillary 186 (H) 70 - 99 mg/dL  Glucose, capillary     Status: Abnormal   Collection Time: 01/05/19 11:55 AM  Result Value Ref Range   Glucose-Capillary 301 (H) 70 - 99 mg/dL  Glucose, capillary     Status: Abnormal   Collection Time: 01/05/19  4:51 PM  Result Value Ref Range   Glucose-Capillary 282 (H) 70 - 99 mg/dL   Comment 1 Notify RN   Glucose, capillary     Status: Abnormal   Collection Time: 01/05/19  8:27 PM  Result Value Ref Range   Glucose-Capillary 229 (H) 70 - 99 mg/dL  Glucose, capillary     Status: Abnormal   Collection Time: 01/06/19  6:01 AM  Result Value Ref Range   Glucose-Capillary 254 (H) 70 - 99 mg/dL    Blood Alcohol level:  Lab Results  Component Value Date   ETH  <10 01/03/2019    Metabolic Disorder Labs: Lab Results  Component Value Date   HGBA1C 12.0 (H) 01/03/2019   MPG 297.7 01/03/2019   MPG 189 08/03/2015   No results found for: PROLACTIN Lab Results  Component Value Date   CHOL  449 (H) 01/03/2019   TRIG 3,631 (H) 01/03/2019   HDL NOT REPORTED DUE TO HIGH TRIGLYCERIDES 01/03/2019   CHOLHDL NOT REPORTED DUE TO HIGH TRIGLYCERIDES 01/03/2019   VLDL UNABLE TO CALCULATE IF TRIGLYCERIDE OVER 400 mg/dL 81/19/1478   LDLCALC NOT CALCULATED 01/03/2019   LDLCALC 105 (H) 08/03/2015    Physical Findings: AIMS: Facial and Oral Movements Muscles of Facial Expression: None, normal Lips and Perioral Area: None, normal Jaw: None, normal Tongue: None, normal,Extremity Movements Upper (arms, wrists, hands, fingers): None, normal Lower (legs, knees, ankles, toes): None, normal, Trunk Movements Neck, shoulders, hips: None, normal, Overall Severity Severity of abnormal movements (highest score from questions above): None, normal Incapacitation due to abnormal movements: None, normal Patient's awareness of abnormal movements (rate only patient's report): No Awareness, Dental Status Current problems with teeth and/or dentures?: No Does patient usually wear dentures?: No  CIWA:    COWS:     Musculoskeletal: Strength & Muscle Tone: within normal limits Gait & Station: normal Patient leans: N/A  Psychiatric Specialty Exam: Physical Exam  Nursing note and vitals reviewed. Constitutional: He is oriented to person, place, and time. He appears well-developed and well-nourished.  Cardiovascular: Normal rate.  Respiratory: Effort normal.  Neurological: He is alert and oriented to person, place, and time.    Review of Systems  Constitutional: Negative.   Respiratory: Negative for cough and shortness of breath.   Cardiovascular: Negative for chest pain.  Gastrointestinal: Negative for diarrhea, nausea and vomiting.  Neurological: Negative for  headaches.  Psychiatric/Behavioral: Positive for depression. Negative for hallucinations, substance abuse and suicidal ideas. The patient has insomnia. The patient is not nervous/anxious.   Denies chest pain or shortness of breath, denies nausea or vomiting  Blood pressure (!) 137/96, pulse 88, temperature 97.6 F (36.4 C), temperature source Oral, resp. rate 18, height 5\' 10"  (1.778 m), weight 124.7 kg, SpO2 99 %.Body mass index is 39.46 kg/m.  General Appearance: Well Groomed  Eye Contact:  Good  Speech:  Normal Rate  Volume:  Normal  Mood:  Reports improving mood  Affect:  Appropriate  Thought Process:  Linear and Descriptions of Associations: Intact  Orientation:  Other:  Currently fully alert and attentive  Thought Content:  Denies hallucinations, no delusions expressed  Suicidal Thoughts:  No-denies suicidal or self-injurious ideations, denies homicidal or violent ideations  Homicidal Thoughts:  No  Memory:  Recent and remote grossly intact  Judgement:  Improving   Insight:  Fair/ improving   Psychomotor Activity:  Normal-no current psychomotor agitation or restlessness, presents calm  Concentration:  Concentration: Good and Attention Span: Good  Recall:  Good  Fund of Knowledge:  Good  Language:  Good  Akathisia:  No  Handed:  Right  AIMS (if indicated):     Assets:  Communication Skills Desire for Improvement Housing Social Support  ADL's:  Intact  Cognition:  WNL  Sleep:  Number of Hours: 1   Assessment-  Patient reports gradually improving mood.  States he feels better than on admission.  Currently presents with a reactive affect and smiles at times appropriately during session.  He describes slowly/gradually improving sleep although continues to experience insomnia, which she characterizes as chronic.  Currently denies medication side effects and feels current medications are helping.  Denies suicidal ideations.  Treatment Plan Summary: Daily contact with patient to  assess and evaluate symptoms and progress in treatment and Medication management  Encourage group and milieu participation Continue Remeron 15 mg PO QHS for mood/sleep Continue  Klonopin 2 mg nightly for anxiety/insomnia Continue Norvasc 10 mg PO daily for HTN Continue benazepril 20 mg PO daily for HTN Continue propanolol 40 mg PO Q4HR PRN DBP>90 Continue Vistaril 25 mg PO TID PRN anxiety Continue diabetic management - Novolog sliding scale, Novolog 6 units with meals, Lantus 30 units SQ daily , Glucophage 500 mg twice daily, as per hospitalist recommendation Treatment team working on disposition planning.   Craige Cotta, MD 01/06/2019, 10:09 AM   Patient ID: Jacob Brady, male   DOB: 1961-06-30, 58 y.o.   MRN: [id number]

## 2019-01-06 NOTE — Progress Notes (Signed)
Reports to this writer " I don't sleep and I walk the halls, can you leave a chair for me to sit in so I don't fall." discussed importance of sleep and medications given. Discussed importance of not walking the halls all night and  attempting to relax and sleep. receptive

## 2019-01-06 NOTE — Progress Notes (Signed)
The patient is presently standing in the hallway near his bedroom and is refusing to go back to bed. He states that he has already slept for a few hours when he has slept for less than an hour. He also states that he is "not wandering". This Pryor Curia offered an additional blanket or pillow but he refused since he already has three pillows.

## 2019-01-06 NOTE — Progress Notes (Signed)
D. Pt has been visible in the milieu throughout the day- friendly upon approach- calm and cooperative- observed interacting appropriately with peers and staff. Per pt's self inventory, pt rated his depression, hopelessness and anxiety all 0's. Pt writes that his goal today is "going home" and "being positive". Pt currently denies SI/HI and A/V H A. Labs and vitals monitored. Pt compliant with medications. Pt supported emotionally and encouraged to express concerns and ask questions.   R. Pt remains safe with 15 minute checks. Will continue POC.

## 2019-01-06 NOTE — Progress Notes (Signed)
[place] NOVEL CORONAVIRUS (COVID-19) DAILY CHECK-OFF SYMPTOMS - answer yes or no to each - every day NO YES  Have you had a fever in the past 24 hours?  . Fever (Temp > 37.80C / 100F) X   Have you had any of these symptoms in the past 24 hours? . New Cough .  Sore Throat  .  Shortness of Breath .  Difficulty Breathing .  Unexplained Body Aches   X   Have you had any one of these symptoms in the past 24 hours not related to allergies?   . Runny Nose .  Nasal Congestion .  Sneezing   X   If you have had runny nose, nasal congestion, sneezing in the past 24 hours, has it worsened?  X   EXPOSURES - check yes or no X   Have you traveled outside the state in the past 14 days?  X   Have you been in contact with someone with a confirmed diagnosis of COVID-19 or PUI in the past 14 days without wearing appropriate PPE?  X   Have you been living in the same home as a person with confirmed diagnosis of COVID-19 or a PUI (household contact)?    X   Have you been diagnosed with COVID-19?    X              What to do next: Answered NO to all: Answered YES to anything:   Proceed with unit schedule Follow the [facility] Inpatient Flowsheet.   

## 2019-01-06 NOTE — Progress Notes (Signed)
D: Pt denied SI, HI, and AVH. He asked questions about his medication and seemed to be unaware of the names of his scheduled medications that he has been taking for three days. When handed medications, he said, "Can I take these to my room?" pt asked when handing him his meds. Told him that mirtazapine is placed under the tongue to dissolve, not swallowed, and he indicated understanding but appeared to chew the med. He was pleasant and polite.  A: Scheduled meds given as ordered, with no adverse side effects noted or reported. Pt urged to bring concerns, needs, and questions to staff as needed. Pt urged to attend groups and proceed with treatment plan. Q15 safety checks maintained.  R: Pt remains free from harm and continues with treatment. Will continue to monitor for needs/safety.

## 2019-01-06 NOTE — Progress Notes (Signed)
[place] NOVEL CORONAVIRUS (COVID-19) DAILY CHECK-OFF SYMPTOMS - answer yes or no to each - every day NO YES  Have you had a fever in the past 24 hours?  . Fever (Temp > 37.80C / 100F) X   Have you had any of these symptoms in the past 24 hours? . New Cough .  Sore Throat  .  Shortness of Breath .  Difficulty Breathing .  Unexplained Body Aches   X   Have you had any one of these symptoms in the past 24 hours not related to allergies?   . Runny Nose .  Nasal Congestion .  Sneezing   X   If you have had runny nose, nasal congestion, sneezing in the past 24 hours, has it worsened?  X   EXPOSURES - check yes or no X   Have you traveled outside the state in the past 14 days?  X   Have you been in contact with someone with a confirmed diagnosis of COVID-19 or PUI in the past 14 days without wearing appropriate PPE?  X   Have you been living in the same home as a person with confirmed diagnosis of COVID-19 or a PUI (household contact)?    X   Have you been diagnosed with COVID-19?    X              What to do next: Answered NO to all: Answered YES to anything:   Proceed with unit schedule Follow the [facility] Inpatient Flowsheet.   

## 2019-01-07 LAB — GLUCOSE, CAPILLARY
Glucose-Capillary: 274 mg/dL — ABNORMAL HIGH (ref 70–99)
Glucose-Capillary: 253 mg/dL — ABNORMAL HIGH (ref 70–99)
Glucose-Capillary: 178 mg/dL — ABNORMAL HIGH (ref 70–99)
Glucose-Capillary: 238 mg/dL — ABNORMAL HIGH (ref 70–99)

## 2019-01-07 MED ORDER — MIRTAZAPINE 15 MG PO TABS
15.0000 mg | ORAL_TABLET | Freq: Every day | ORAL | Status: DC
  Administered 2019-01-07: 15 mg via ORAL
  Filled 2019-01-07 (×4): qty 1

## 2019-01-07 MED ORDER — TRAZODONE HCL 50 MG PO TABS
50.0000 mg | ORAL_TABLET | Freq: Once | ORAL | Status: AC
  Administered 2019-01-07: 50 mg via ORAL
  Filled 2019-01-07 (×2): qty 1

## 2019-01-07 NOTE — Progress Notes (Signed)
Patient ID: Jacob Brady, male   DOB: 1960/10/21, 58 y.o.   MRN: [id number]  Nursing Progress Note [id number]  On initial approach, patient is seen up in the milieu but is minimally interactive with peers. Patient presents with worried/anxious mood and is provided PRN Vistaril. Patient compliant with scheduled medications. Patient currently denies SI/HI/AVH. Patient reports he signed a request for discharge and is anxious about not sleeping at night.  Patient is educated about and provided medication per provider's orders. Patient safety maintained with q15 min safety checks and high fall risk precautions. Emotional support given, 1:1 interaction, and active listening provided. Patient encouraged to attend meals, groups, and work on treatment plan and goals. Labs, vital signs and patient behavior monitored throughout shift. Patient encouraged to wear mask when in the milieu and is educated about coronavirus infection control precautions.  Patient refusing to wear mask on the unit but is compliant to wear mask off unit. Patient contracts for safety with staff. Patient remains safe on the unit at this time and agrees to come to staff with any issues/concerns. Will continue to support and monitor.

## 2019-01-07 NOTE — Progress Notes (Signed)
Pt is now in his room after sitting in the alcove. He has been unable to sleep. He says he took a four-hour nap this afternoon, but has been having difficulty sleeping for about four to five months.

## 2019-01-07 NOTE — [facility] Group Notes (Signed)
[place] [facility] LCSW Group Therapy Note  Date/Time:  01/07/2019 9:00-10:00 or 10:00-11:00AM  Type of Therapy and Topic:  Group Therapy:  Healthy and Unhealthy Supports  Participation Level:  Active   Description of Group:  Patients in this group were introduced to the idea of adding a variety of healthy supports to address the various needs in their lives.Patients discussed what additional healthy supports could be helpful in their recovery and wellness after discharge in order to prevent future hospitalizations.   An emphasis was placed on using counselor, doctor, therapy groups, 12-step groups, and problem-specific support groups to expand supports.  They also worked as a group on developing a specific plan for several patients to deal with unhealthy supports through [place], psychoeducation with loved ones, and even termination of relationships.   Therapeutic Goals:   1)  discuss importance of adding supports to stay well once out of the hospital  2)  compare healthy versus unhealthy supports and identify some examples of each  3)  generate ideas and descriptions of healthy supports that can be added  4)  offer mutual support about how to address unhealthy supports  5)  encourage active participation in and adherence to discharge plan    Summary of Patient Progress:  The patient stated that current healthy supports in hislife are the services he receives through the [place]. The patient did not identify unhealthy supports but stated that since he gave up partying and got sober. Since this he feels isolated and lonely. Patient expressed that he has difficulty making new friends or establishing supports. He expressed doubt that he can do it.    Therapeutic Modalities:   Motivational Interviewing Brief Solution-Focused Therapy  Rolanda Jay

## 2019-01-07 NOTE — Progress Notes (Addendum)
[facility] MD Progress Note  01/07/2019 1:03 PM Coby Shrewsberry  MRN:  [id number] Subjective:  Patient reports overall improvement.  He reports persistent insomnia . Denies medication side effects.  Denies suicidal ideations and presents future oriented, stating [facility]   Objective: I have met with patient and reviewed chart notes. 58 year old male, admitted due to depression, insomnia, suicidality.  Patient reports he is feeling better. His major concern at this time is persistent insomnia, which he states is chronic. Denies suicidal ideations and currently presents future oriented . He is visible in day room, polite on approach. No agitated or disruptive behaviors. Currently focused on disposition planning and hoping for discharge soon.  MMSE today is 27/30 ( recall 3/3 immediate and 1/3 at 5 minutes )  Denies medication side effects-  Currently on Trazodone 50 mgrs QHS PRN for insomnia, Klonopin 2 mgrs QHS. CBG today 253.   Principal Problem: Depression Diagnosis: Active Problems:   MDD (major depressive disorder), recurrent episode, severe ([place])  Total Time spent with patient: 15 minutes  Past Psychiatric History: See admission H&P  Past Medical History:  Past Medical History:  Diagnosis Date  . Arthritis   . Diabetes mellitus without complication ([place])   . Hypertension   . PTSD (post-traumatic stress disorder)     Past Surgical History:  Procedure Laterality Date  . CHOLECYSTECTOMY    . WRIST FRACTURE SURGERY Right    Family History: History reviewed. No pertinent family history. Family Psychiatric  History: See admission H&P Social History:  Social History   Substance and Sexual Activity  Alcohol Use No     Social History   Substance and Sexual Activity  Drug Use No    Social History   Socioeconomic History  . Marital status: Divorced    Spouse name: Not on file  . Number of children: Not on file  . Years of education: Not on  file  . Highest education level: Not on file  Occupational History  . Not on file  Social Needs  . Financial resource strain: Not on file  . Food insecurity    Worry: Not on file    Inability: Not on file  . Transportation needs    Medical: Not on file    Non-medical: Not on file  Tobacco Use  . Smoking status: Current Every Day Smoker  . Smokeless tobacco: Never Used  Substance and Sexual Activity  . Alcohol use: No  . Drug use: No  . Sexual activity: Not Currently  Lifestyle  . Physical activity    Days per week: Not on file    Minutes per session: Not on file  . Stress: Not on file  Relationships  . Social Herbalist on phone: Not on file    Gets together: Not on file    Attends religious service: Not on file    Active member of club or organization: Not on file    Attends meetings of clubs or organizations: Not on file    Relationship status: Not on file  Other Topics Concern  . Not on file  Social History Narrative  . Not on file   Additional Social History:   Sleep: Fair  Appetite:  Good  Current Medications: Current Facility-Administered Medications  Medication Dose Route Frequency Provider Last Rate Last Dose  . alum & mag hydroxide-simeth (MAALOX/MYLANTA) 200-200-20 MG/5ML suspension 15 mL  15 mL Oral Q6H PRN Cobos, Myer Peer, MD  15 mL at 01/06/19 1545  . amLODipine (NORVASC) tablet 10 mg  10 mg Oral Daily Rankin, Shuvon B, NP   10 mg at 01/07/19 0751   And  . benazepril (LOTENSIN) tablet 20 mg  20 mg Oral Daily Rankin, Shuvon B, NP   20 mg at 01/07/19 0751  . clonazePAM (KLONOPIN) tablet 2 mg  2 mg Oral QHS Johnn Hai, MD   2 mg at 01/06/19 2108  . hydrOXYzine (ATARAX/VISTARIL) tablet 25 mg  25 mg Oral TID PRN Rankin, Shuvon B, NP   25 mg at 01/07/19 0752  . ibuprofen (ADVIL) tablet 400 mg  400 mg Oral Q6H PRN Connye Burkitt, NP   400 mg at 01/05/19 1513  . insulin aspart (novoLOG) injection 0-9 Units  0-9 Units Subcutaneous TID WC & HS  Cobos, Myer Peer, MD   5 Units at 01/07/19 1202  . insulin aspart (novoLOG) injection 6 Units  6 Units Subcutaneous TID WC Domenic Polite, MD   6 Units at 01/07/19 1201  . insulin glargine (LANTUS) injection 30 Units  30 Units Subcutaneous Daily Domenic Polite, MD   30 Units at 01/07/19 0755  . metFORMIN (GLUCOPHAGE) tablet 500 mg  500 mg Oral BID WC Rankin, Shuvon B, NP   500 mg at 01/07/19 0751  . mirtazapine (REMERON SOL-TAB) disintegrating tablet 15 mg  15 mg Oral QHS Johnn Hai, MD   15 mg at 01/06/19 2108  . nicotine (NICODERM CQ - dosed in mg/24 hours) patch 21 mg  21 mg Transdermal Daily Rankin, Shuvon B, NP   21 mg at 01/07/19 0751  . propranolol (INDERAL) tablet 40 mg  40 mg Oral Q4H PRN Johnn Hai, MD   40 mg at 01/07/19 4235  . traZODone (DESYREL) tablet 50 mg  50 mg Oral QHS PRN Rankin, Shuvon B, NP   50 mg at 01/07/19 0000    Lab Results:  Results for orders placed or performed during the hospital encounter of 01/03/19 (from the past 48 hour(s))  Glucose, capillary     Status: Abnormal   Collection Time: 01/05/19  4:51 PM  Result Value Ref Range   Glucose-Capillary 282 (H) 70 - 99 mg/dL   Comment 1 Notify RN   Glucose, capillary     Status: Abnormal   Collection Time: 01/05/19  8:27 PM  Result Value Ref Range   Glucose-Capillary 229 (H) 70 - 99 mg/dL  Glucose, capillary     Status: Abnormal   Collection Time: 01/06/19  6:01 AM  Result Value Ref Range   Glucose-Capillary 254 (H) 70 - 99 mg/dL  Glucose, capillary     Status: Abnormal   Collection Time: 01/06/19 11:47 AM  Result Value Ref Range   Glucose-Capillary 240 (H) 70 - 99 mg/dL  Glucose, capillary     Status: Abnormal   Collection Time: 01/06/19  5:10 PM  Result Value Ref Range   Glucose-Capillary 281 (H) 70 - 99 mg/dL   Comment 1 Notify RN    Comment 2 Document in Chart   Glucose, capillary     Status: Abnormal   Collection Time: 01/06/19  8:41 PM  Result Value Ref Range   Glucose-Capillary 213 (H) 70  - 99 mg/dL   Comment 1 Notify RN   Glucose, capillary     Status: Abnormal   Collection Time: 01/07/19  5:48 AM  Result Value Ref Range   Glucose-Capillary 274 (H) 70 - 99 mg/dL   Comment 1 Notify RN  Glucose, capillary     Status: Abnormal   Collection Time: 01/07/19 11:50 AM  Result Value Ref Range   Glucose-Capillary 253 (H) 70 - 99 mg/dL    Blood Alcohol level:  Lab Results  Component Value Date   ETH <10 70/48/8891    Metabolic Disorder Labs: Lab Results  Component Value Date   HGBA1C 12.0 (H) 01/03/2019   MPG 297.7 01/03/2019   MPG 189 08/03/2015   No results found for: PROLACTIN Lab Results  Component Value Date   CHOL 449 (H) 01/03/2019   TRIG 3,631 (H) 01/03/2019   HDL NOT REPORTED DUE TO HIGH TRIGLYCERIDES 01/03/2019   CHOLHDL NOT REPORTED DUE TO HIGH TRIGLYCERIDES 01/03/2019   VLDL UNABLE TO CALCULATE IF TRIGLYCERIDE OVER 400 mg/dL 01/03/2019   LDLCALC NOT CALCULATED 01/03/2019   LDLCALC 105 (H) 08/03/2015    Physical Findings: AIMS: Facial and Oral Movements Muscles of Facial Expression: None, normal Lips and Perioral Area: None, normal Jaw: None, normal Tongue: None, normal,Extremity Movements Upper (arms, wrists, hands, fingers): None, normal Lower (legs, knees, ankles, toes): None, normal, Trunk Movements Neck, shoulders, hips: None, normal, Overall Severity Severity of abnormal movements (highest score from questions above): None, normal Incapacitation due to abnormal movements: None, normal Patient's awareness of abnormal movements (rate only patient's report): No Awareness, Dental Status Current problems with teeth and/or dentures?: No Does patient usually wear dentures?: No  CIWA:    COWS:     Musculoskeletal: Strength & Muscle Tone: within normal limits Gait & Station: normal Patient leans: N/A  Psychiatric Specialty Exam: Physical Exam  Nursing note and vitals reviewed. Constitutional: He is oriented to person, place, and time. He  appears well-developed and well-nourished.  Cardiovascular: Normal rate.  Respiratory: Effort normal.  Neurological: He is alert and oriented to person, place, and time.    Review of Systems  Constitutional: Negative.   Respiratory: Negative for cough and shortness of breath.   Cardiovascular: Negative for chest pain.  Gastrointestinal: Negative for diarrhea, nausea and vomiting.  Neurological: Negative for headaches.  Psychiatric/Behavioral: Positive for depression. Negative for hallucinations, substance abuse and suicidal ideas. The patient has insomnia. The patient is not nervous/anxious.   Denies chest pain or shortness of breath, no cough,  denies nausea or vomiting  Blood pressure (!) 155/104, pulse 86, temperature 97.6 F (36.4 C), temperature source Oral, resp. rate 18, height 5' 10" (1.778 m), weight 124.7 kg, SpO2 99 %.Body mass index is 39.46 kg/m.  General Appearance: Well Groomed  Eye Contact:  Good  Speech:  Normal Rate  Volume:  Normal  Mood:  reports some improvement compared to admission  Affect:  reactive, slightly irritable, tends to improve during session  Thought Process:  Linear and Descriptions of Associations: Intact  Orientation:  Other:  Currently fully alert and attentive  Thought Content:  Denies hallucinations, no delusions expressed  Suicidal Thoughts:  No-denies suicidal or self-injurious ideations, denies homicidal or violent ideations  Homicidal Thoughts:  No  Memory:  Recent and remote grossly intact  Judgement:  Improving   Insight:  Fair/ improving   Psychomotor Activity:  Normal-no current psychomotor agitation or restlessness, presents calm  Concentration:  Concentration: Good and Attention Span: Good  Recall:  3/3 immediate, 1/3 at 5 minutes   Fund of Knowledge:  Good  Language:  Good  Akathisia:  No  Handed:  Right  AIMS (if indicated):     Assets:  Communication Skills Desire for Improvement Housing Social Support  ADL's:  Intact  Cognition:  WNL  Sleep:  Number of Hours: 3   Assessment-  58 year old male, admitted due to depression, insomnia, suicidality.  Patient reports some improvement compared to admission, although expresses feeling frustrated regarding persistent insomnia. Currently denies SI, and presents future oriented .  Denies medication side effects. MMSE 27/30 today  Treatment Plan Summary: Daily contact with patient to assess and evaluate symptoms and progress in treatment and Medication management  Treatment Plan reviewed as below today 6/14  Encourage group and milieu participation Continue Remeron 15 mg PO QHS for mood/sleep Continue Klonopin 2 mg nightly for anxiety/insomnia Continue Norvasc 10 mg PO daily for HTN Continue benazepril 20 mg PO daily for HTN Continue propanolol 40 mg PO Q4HR PRN DBP>90 Continue Vistaril 25 mg PO TID PRN anxiety Continue diabetic management - Novolog sliding scale, Novolog 6 units with meals, Lantus 30 units SQ daily , Glucophage 500 mg twice daily, as per hospitalist recommendation Treatment team working on disposition planning. We discussed potential benefit of going to sleep lab for further diagnostic work up of his chronic insomnia, and to rule out sleep apnea. He states he does plan to do this via the [place] , although thinks this service may currently be closed due to coronavirus pandemic.   Jenne Campus, MD 01/07/2019, 1:03 PM   Patient ID: Douglass Rivers, male   DOB: Dec 30, 1960, 58 y.o.   MRN: [id number]

## 2019-01-07 NOTE — Progress Notes (Signed)
D: "My anger is about at a nine." Pt, who despite his statement is polite and cooperative, says without prompting. Upon approach, he is staring in the dayroom window, where patients are finishing up evening snack. He declined any offer of a snack, and said his main concern is whether he will be able to sleep tonight. "That's the reason I came here for help."   Later, at midnight, he began banging his head on the glass of the dayroom window. He was redirected. Pt expressed ongoing frustration and suggested he would harm himself as a result. "I'm going to do something worse." Pt continued to express his anger before retreating to his room, where this writer spoke with him again and pt irritably contracted for safety. Allowed pt to vent feelings and provided support.   A: Scheduled meds given as ordered, as well as relevant PRNs for sleep and anxiety, with no adverse side effects noted or reported. Vitals and labs monitored. Emotional support offered and/or provided. Pt urged to bring concerns, needs, and questions to staff as needed. Pt urged to wear mask, attend groups, and proceed with treatment plan. Q15 safety checks maintained.  R: Pt remains free from harm and continues with treatment. Will continue to monitor for needs/safety.

## 2019-01-07 NOTE — Progress Notes (Signed)
Furqan apologized for his irritability during the night. He said he got a few hours of sleep after the last trazodone dose and felt somewhat rested.

## 2019-01-07 NOTE — Progress Notes (Signed)
Despite receiving 2 mg Klonopin, 15 mg Remeron, 50 mg trazodone, and 25 mg Vistaril over the course of the evening, patient continues to report an inability to sleep and extremely frustrated as a result. Notified Patriciaann Clan PA, who authorized a one-time additional 50 mg trazodone. Mechele Claude [facility] spoke with pt as well and provided an opportunity to express his feelings. Pt is uncomfortable in his room, as pt's roommate keeps the room very cold, and does not feel he can stay in there if unable to sleep. Pt expressed thanks for the measures taken on his behalf, but continued to express frustration. He signed a 72-hour request for discharge at 0253 after the process was explained, and was given a copy (original on chart). Will continue to monitor for needs/safety.

## 2019-01-07 NOTE — Progress Notes (Signed)
Patient ID: Jacob Brady, male   DOB: 22-Dec-1960, 58 y.o.   MRN: [id number]  [place] NOVEL CORONAVIRUS (COVID-19) DAILY CHECK-OFF SYMPTOMS - answer yes or no to each - every day NO YES  Have you had a fever in the past 24 hours?  . Fever (Temp > 37.80C / 100F) X   Have you had any of these symptoms in the past 24 hours? . New Cough .  Sore Throat  .  Shortness of Breath .  Difficulty Breathing .  Unexplained Body Aches   X   Have you had any one of these symptoms in the past 24 hours not related to allergies?   . Runny Nose .  Nasal Congestion .  Sneezing   X   If you have had runny nose, nasal congestion, sneezing in the past 24 hours, has it worsened?  X   EXPOSURES - check yes or no X   Have you traveled outside the state in the past 14 days?  X   Have you been in contact with someone with a confirmed diagnosis of COVID-19 or PUI in the past 14 days without wearing appropriate PPE?  X   Have you been living in the same home as a person with confirmed diagnosis of COVID-19 or a PUI (household contact)?    X   Have you been diagnosed with COVID-19?    X              What to do next: Answered NO to all: Answered YES to anything:   Proceed with unit schedule Follow the [facility] Inpatient Flowsheet.

## 2019-01-07 NOTE — Plan of Care (Signed)
  Problem: Activity: Goal: Interest or engagement in activities will improve Outcome: Progressing   Problem: Health Behavior/Discharge Planning: Goal: Compliance with treatment plan for underlying cause of condition will improve Outcome: Progressing   Problem: Safety: Goal: Periods of time without injury will increase Outcome: Progressing   

## 2019-01-08 LAB — GLUCOSE, CAPILLARY
Glucose-Capillary: 281 mg/dL — ABNORMAL HIGH (ref 70–99)
Glucose-Capillary: 236 mg/dL — ABNORMAL HIGH (ref 70–99)

## 2019-01-08 MED ORDER — METFORMIN HCL 500 MG PO TABS: 500.0000 mg | ORAL_TABLET | Freq: Two times a day (BID) | ORAL | 0 refills | Status: DC

## 2019-01-08 MED ORDER — INSULIN STARTER KIT- SYRINGES (ENGLISH)
1.0000 | Freq: Once | Status: AC
  Administered 2019-01-08: 1
  Filled 2019-01-08: qty 1

## 2019-01-08 MED ORDER — BENAZEPRIL HCL 20 MG PO TABS: 20.0000 mg | ORAL_TABLET | Freq: Every day | ORAL | 2 refills | Status: DC

## 2019-01-08 MED ORDER — INSULIN STARTER KIT- SYRINGES (ENGLISH): 1.0000 | Freq: Once | 0 refills | Status: DC

## 2019-01-08 MED ORDER — PROPRANOLOL HCL 80 MG PO TABS: 40.0000 mg | ORAL_TABLET | Freq: Two times a day (BID) | ORAL | 1 refills | Status: DC

## 2019-01-08 MED ORDER — MIRTAZAPINE 30 MG PO TABS: 30.0000 mg | ORAL_TABLET | Freq: Every day | ORAL | 1 refills | Status: DC

## 2019-01-08 MED ORDER — INSULIN GLARGINE 100 UNIT/ML ~~LOC~~ SOLN: 30.0000 [IU] | Freq: Every day | SUBCUTANEOUS | 0 refills | Status: DC

## 2019-01-08 MED ORDER — LIVING WELL WITH DIABETES BOOK
Freq: Once | Status: AC
  Administered 2019-01-08: 12:00:00
  Filled 2019-01-08: qty 1

## 2019-01-08 MED ORDER — MIRTAZAPINE 30 MG PO TABS: 30.0000 mg | ORAL_TABLET | Freq: Every day | ORAL | 0 refills | Status: DC

## 2019-01-08 MED ORDER — AMLODIPINE BESYLATE 10 MG PO TABS: 10.0000 mg | ORAL_TABLET | Freq: Every day | ORAL | 0 refills | Status: DC

## 2019-01-08 MED ORDER — METFORMIN HCL 500 MG PO TABS: 500.0000 mg | ORAL_TABLET | Freq: Two times a day (BID) | ORAL | 2 refills | Status: DC

## 2019-01-08 MED ORDER — AMLODIPINE BESYLATE 10 MG PO TABS: 10.0000 mg | ORAL_TABLET | Freq: Every day | ORAL | 2 refills | Status: DC

## 2019-01-08 MED ORDER — BENAZEPRIL HCL 20 MG PO TABS: 20.0000 mg | ORAL_TABLET | Freq: Every day | ORAL | 0 refills | Status: DC

## 2019-01-08 MED ORDER — INSULIN GLARGINE 100 UNIT/ML ~~LOC~~ SOLN: 30.0000 [IU] | Freq: Every day | SUBCUTANEOUS | 11 refills | Status: DC

## 2019-01-08 NOTE — Progress Notes (Signed)
[place] NOVEL CORONAVIRUS (COVID-19) DAILY CHECK-OFF SYMPTOMS - answer yes or no to each - every day NO YES  Have you had a fever in the past 24 hours?  . Fever (Temp > 37.80C / 100F) X   Have you had any of these symptoms in the past 24 hours? . New Cough .  Sore Throat  .  Shortness of Breath .  Difficulty Breathing .  Unexplained Body Aches   X   Have you had any one of these symptoms in the past 24 hours not related to allergies?   . Runny Nose .  Nasal Congestion .  Sneezing   X   If you have had runny nose, nasal congestion, sneezing in the past 24 hours, has it worsened?  X   EXPOSURES - check yes or no X   Have you traveled outside the state in the past 14 days?  X   Have you been in contact with someone with a confirmed diagnosis of COVID-19 or PUI in the past 14 days without wearing appropriate PPE?  X   Have you been living in the same home as a person with confirmed diagnosis of COVID-19 or a PUI (household contact)?    X   Have you been diagnosed with COVID-19?    X              What to do next: Answered NO to all: Answered YES to anything:   Proceed with unit schedule Follow the [facility] Inpatient Flowsheet.   

## 2019-01-08 NOTE — Progress Notes (Signed)
Spiritual care group on grief and loss facilitated by chaplain Jerene Pitch  Group Goal:  Support / Education around grief and loss Members engage in facilitated group support and psycho social education.  Group Description:  Following introductions and group rules,  Group members engaged in facilitated group dialog and support around topic of loss, with particular support around experiences of loss in their lives. Group Identified types of loss (relationships / self / things) and identified patterns, circumstances, and changes that precipitate losses. Reflected on thoughts / feelings around loss, normalized grief responses, and recognized variety in grief experience. Patient Progress:  Pt did not attend

## 2019-01-08 NOTE — Discharge Summary (Signed)
Physician Discharge Summary Note  Patient:  Jacob Brady is an 58 y.o., male MRN:  [id number] DOB:  [birth year] Patient phone:  [phone] (home)  Patient address:   [place] [address],  Total Time spent with patient: 45 minutes  Date of Admission:  01/03/2019 Date of Discharge: 01/08/2019  Reason for Admission:   Mr. Rottenberg is a 58 year old patient who has received healthcare through the [facility] previously, he presented yesterday initially complaining of insomnia, suicidality, and he had actually been on the phone with his sister when he expressed, by his report, the severity of his depression which prompted a wellness check from law enforcement.  The patient spends our interview lobbying for discharge insisting that he is been misunderstood completely and he would like to leave immediately.  He states that if he leaves today he can pick up insulin and needles and supplies like this from the [facility] -when I ask if I can phone his sister he states he does not know the telephone number however he acknowledged that he had phoned her prior to coming in the hospital.  Further, the report that he was writing notes for his daughter and sister and was planning on hanging himself and put some type of noose around his neck, which was documented yesterday as an acute event, he states that happened "3 years ago" and it is not current. There is another comment that he stated that he would be better off dead rather than have this insomnia further there is another statement that he had made documented that he had access to a gun at home and that would be quicker something to that effect and he again tells me that this is not true.  He elaborates that he simply was asked if he had access to firearms and he told them he did not have one at home but did have access to one.  Thus the patient contradicts the medical record thus far, lobbyist for discharge continually, but  does state that he quit his venlafaxine within the past 2 weeks and he suffered from severe insomnia and when I describe other symptoms of venlafaxine withdrawal he states that he has had them as well to include the electric type shocks and anxiety.  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  He states Latuda did the same thing.   Principal Problem: <principal problem not specified> Discharge Diagnoses: Active Problems:   MDD (major depressive disorder), recurrent episode, severe ([place])   Past Psychiatric History: ext  Past Medical History:  Past Medical History:  Diagnosis Date  . Arthritis   . Diabetes mellitus without complication ([place])   . Hypertension   . PTSD (post-traumatic stress disorder)     Past Surgical History:  Procedure Laterality Date  . CHOLECYSTECTOMY    . WRIST FRACTURE SURGERY Right    Family History: History reviewed. No pertinent family history. Family Psychiatric  History: no new Social History:  Social History   Substance and Sexual Activity  Alcohol Use No     Social History   Substance and Sexual Activity  Drug Use No    Social History   Socioeconomic History  . Marital status: Divorced    Spouse name: Not on file  . Number of children: Not on file  . Years of education: Not on file  . Highest education level: Not on file  Occupational History  . Not on file  Social Needs  . Financial resource strain: Not on file  . Food insecurity    Worry: Not on file    Inability: Not on file  . Transportation needs    Medical: Not on file    Non-medical: Not on file  Tobacco Use  . Smoking status: Current Every Day Smoker  . Smokeless tobacco: Never Used  Substance and Sexual Activity  . Alcohol use: No  . Drug use: No  . Sexual activity: Not Currently  Lifestyle  . Physical activity    Days per week: Not on file    Minutes per session: Not on file  . Stress: Not  on file  Relationships  . Social Herbalist on phone: Not on file    Gets together: Not on file    Attends religious service: Not on file    Active member of club or organization: Not on file    Attends meetings of clubs or organizations: Not on file    Relationship status: Not on file  Other Topics Concern  . Not on file  Social History Narrative  . Not on file    Hospital Course:   As above patient was hospitalized due to safety concerns and his version of events completely contradicted the version told by other examiners and the initial reports.  He was always lobbying for discharge but I felt we simply could not discharge him upon admission because the mismatch and information was too drastic and further he did not have his sister's phone number by his report and I had no collateral information.  So the patient was monitored over the weekend and he continued to display no dangerous behaviors lobby for discharge and insist that he was not suicidal.  Dr. Parke Poisson felt the patient probably suffered from sleep apnea based on his physique and I think this is accurate so he used mirtazapine rather than a benzodiazepine for insomnia and the patient only slept a couple of hours but stated he thought it worked better in the pill form rather than the sublingual form.  By the morning of the 15th he continued to ask for discharge stating "what you need for me so that I can go" and I explained to him I needed reassurance is that he could contract for safety and he could he insisted he was not suicidal, I still could not get collateral information as he insisted he did know his sister's phone number at any rate he denied suicidal thoughts plans or intent and had been monitored through the weekend displayed no danger behaviors was coherent and goal-directed in his thought processes so there is no reason to keep him against his will at this point.  Physical Findings: AIMS: Facial and Oral  Movements Muscles of Facial Expression: None, normal Lips and Perioral Area: None, normal Jaw: None, normal Tongue: None, normal,Extremity Movements Upper (arms, wrists, hands, fingers): None, normal Lower (legs, knees, ankles, toes): None, normal, Trunk Movements Neck, shoulders, hips: None, normal, Overall Severity Severity of abnormal movements (highest score from questions above): None, normal Incapacitation due to abnormal movements: None, normal Patient's awareness of abnormal movements (rate only patient's report): No Awareness, Dental Status Current problems with teeth and/or dentures?: No Does patient usually wear dentures?: No  CIWA:    COWS:    Musculoskeletal: Strength & Muscle Tone: within normal limits Gait & Station: normal Patient leans: N/A  Psychiatric Specialty Exam: ROS  Blood  pressure (!) 139/96, pulse 83, temperature 98.2 F (36.8 C), temperature source Oral, resp. rate 20, height 5' 10"  (1.778 m), weight 124.7 kg, SpO2 99 %.Body mass index is 39.46 kg/m.  General Appearance: Casual  Eye Contact::  Good  Speech:  Clear and Coherent409  Volume:  Normal  Mood:  Euthymic  Affect:  Full Range  Thought Process:  Coherent and Descriptions of Associations: Intact  Orientation:  Full (Time, Place, and Person)  Thought Content:  Logical  Suicidal Thoughts:  No  Homicidal Thoughts:  No  Memory:  Recent;   Fair  Judgement:  Intact  Insight:  Fair  Psychomotor Activity:  EPS-neg  Concentration:  Good  Recall:  Good  Fund of Knowledge:Good  Language: Good  Akathisia:  Negative  Handed:  Right  AIMS (if indicated):     Assets:  Communication Skills Desire for Improvement  Sleep:  Number of Hours: 3  Cognition: nl  ADL's:  Intact     Have you used any form of tobacco in the last 30 days? (Cigarettes, Smokeless Tobacco, Cigars, and/or Pipes): Yes  Has this patient used any form of tobacco in the last 30 days? (Cigarettes, Smokeless Tobacco, Cigars,  and/or Pipes) Yes, No  Blood Alcohol level:  Lab Results  Component Value Date   ETH <10 83/25/4982    Metabolic Disorder Labs:  Lab Results  Component Value Date   HGBA1C 12.0 (H) 01/03/2019   MPG 297.7 01/03/2019   MPG 189 08/03/2015   No results found for: PROLACTIN Lab Results  Component Value Date   CHOL 449 (H) 01/03/2019   TRIG 3,631 (H) 01/03/2019   HDL NOT REPORTED DUE TO HIGH TRIGLYCERIDES 01/03/2019   CHOLHDL NOT REPORTED DUE TO HIGH TRIGLYCERIDES 01/03/2019   VLDL UNABLE TO CALCULATE IF TRIGLYCERIDE OVER 400 mg/dL 01/03/2019   LDLCALC NOT CALCULATED 01/03/2019   [place] 105 (H) 08/03/2015    See Psychiatric Specialty Exam and Suicide Risk Assessment completed by Attending Physician prior to discharge.  Discharge destination:  Home  Is patient on multiple antipsychotic therapies at discharge:  No   Has Patient had three or more failed trials of antipsychotic monotherapy by history:  No  Recommended Plan for Multiple Antipsychotic Therapies: NA   Allergies as of 01/08/2019      Reactions   Hctz [hydrochlorothiazide] Other (See Comments)   Unknown, "they just told me not to take it anymore"   Seroquel [quetiapine Fumarate] Other (See Comments)   Elevates liver enzymes      Medication List    TAKE these medications     Indication  amLODipine 10 MG tablet Commonly known as: NORVASC Take 1 tablet (10 mg total) by mouth daily. Start taking on: January 09, 2019  Indication: High Blood Pressure Disorder   benazepril 20 MG tablet Commonly known as: LOTENSIN Take 1 tablet (20 mg total) by mouth daily. Start taking on: January 09, 2019  Indication: High Blood Pressure Disorder   insulin glargine 100 UNIT/ML injection Commonly known as: LANTUS Inject 0.3 mLs (30 Units total) into the skin daily. Start taking on: January 09, 2019  Indication: Insulin-Dependent Diabetes, Type 2 Diabetes   insulin starter kit- syringes Misc 1 kit by Other route once for 1  dose.  Indication: 3 Second R-R Interval   metFORMIN 500 MG tablet Commonly known as: GLUCOPHAGE Take 1 tablet (500 mg total) by mouth 2 (two) times daily with a meal.  Indication: Type 2 Diabetes   mirtazapine 30 MG tablet  Commonly known as: REMERON Take 1 tablet (30 mg total) by mouth at bedtime.  Indication: Major Depressive Disorder   propranolol 80 MG tablet Commonly known as: INDERAL Take 0.5 tablets (40 mg total) by mouth 2 (two) times daily.  Indication: High Blood Pressure Disorder      Follow-up Information    [facility] Follow up on 01/12/2019.   Why: Medication management appointment is Friday, 6/19 at 2:30p.  Paramacologist appointment is Wednesday, 6/24 at 9:30a.  Therapy appointment is Wednesday, 6/24 at 12:00p.  Providers will contact you regarding how appointments will be conducted.    Contact information: [place] [address] [phone]        [facility] Follow up on 04/25/2019.   Why: Primary care appointment is Wednesday, 9/30 at 11:00a.  Please bring your current medications and discharge paperwork from this hospitalization.  Contact information: [place] [address] Ph: [phone] fx: [phone]       [facility] Follow up on 01/18/2019.   Why: Follow up appointment with diabetes and hypertension is Thursday, 6/25 at 2:30p.  Your appointment will be over the phone and the provider will contact you.  If you wish to have a virtual appointment please download the MyChart app.  Contact information: [place] [address] [phone]          SignedJohnn Hai, MD 01/08/2019, 11:57 AM

## 2019-01-08 NOTE — Progress Notes (Signed)
  [facility] Adult Case Management Discharge Plan :  Will you be returning to the same living situation after discharge:  Yes,  home At discharge, do you have transportation home?: Yes,  taking a taxi Do you have the ability to pay for your medications: Yes,  Medicare and VA service connected  Release of information consent forms completed and in the chart. Patient to Follow up at: Follow-up Information    [facility] Follow up on 01/12/2019.   Why: Medication management appointment is Friday, 6/19 at 2:30p.  Paramacologist appointment is Wednesday, 6/24 at 9:30a.  Therapy appointment is Wednesday, 6/24 at 12:00p.  Providers will contact you regarding how appointments will be conducted.    Contact information: [place] [address] [phone]        [facility] Follow up on 04/25/2019.   Why: Primary care appointment is Wednesday, 9/30 at 11:00a.  Please bring your current medications and discharge paperwork from this hospitalization.  Contact information: [place] [address] Ph: [phone] fx: [phone]       [facility] Follow up on 01/18/2019.   Why: Follow up appointment with diabetes and hypertension is Thursday, 6/25 at 2:30p.  Your appointment will be over the phone and the provider will contact you.  If you wish to have a virtual appointment please download the MyChart app.  Contact information: [address] [place] [address] [phone]          Next level of care provider has access to [place] and Suicide Prevention discussed: Yes,  with patient, he declines other consents  Have you used any form of tobacco in the last 30 days? (Cigarettes, Smokeless Tobacco, Cigars, and/or Pipes): Yes  Has patient been referred to the Quitline?: Patient refused referral  Patient has been referred  for addiction treatment: Yes  Joellen Jersey, [place] 01/08/2019, 12:50 PM

## 2019-01-08 NOTE — Progress Notes (Signed)
Patient ID: Jacob Brady, male   DOB: Dec 26, 1960, 58 y.o.   MRN: [id number] Pt d/c to home via cab with voucher, to home. D/c instructions and medications given and reviewed. Pt verbalizes understanding.

## 2019-01-08 NOTE — Progress Notes (Signed)
Recreation Therapy Notes  Date:  6.15.20 Time: 0930 Location: 300 Hall Dayroom  Group Topic: Stress Management  Goal Area(s) Addresses:  Patient will identify positive stress management techniques. Patient will identify benefits of using stress management post d/c.  Behavioral Response:  Engaged  Intervention: Stress Management  Activity :  Meditation.  LRT introduced the stress management technique of meditation.  LRT played a meditation of making the most of your day.  Patients were to listen and follow along as meditation played to engage in activity.  Education:  Stress Management, Discharge Planning.   Education Outcome: Acknowledges Education  Clinical Observations/Feedback:  Pt attended and participated in group.     Victorino Sparrow, LRT/CTRS         Ria Comment, [place] Adduci A 01/08/2019 11:09 AM

## 2019-01-08 NOTE — Progress Notes (Signed)
Patient ID: Jacob Brady, male   DOB: 02/02/1961, 57 y.o.   MRN: [id number]  [place] NOVEL CORONAVIRUS (COVID-19) DAILY CHECK-OFF SYMPTOMS - answer yes or no to each - every day NO YES  Have you had a fever in the past 24 hours?  . Fever (Temp > 37.80C / 100F) X   Have you had any of these symptoms in the past 24 hours? . New Cough .  Sore Throat  .  Shortness of Breath .  Difficulty Breathing .  Unexplained Body Aches   X   Have you had any one of these symptoms in the past 24 hours not related to allergies?   . Runny Nose .  Nasal Congestion .  Sneezing   X   If you have had runny nose, nasal congestion, sneezing in the past 24 hours, has it worsened?  X   EXPOSURES - check yes or no X   Have you traveled outside the state in the past 14 days?  X   Have you been in contact with someone with a confirmed diagnosis of COVID-19 or PUI in the past 14 days without wearing appropriate PPE?  X   Have you been living in the same home as a person with confirmed diagnosis of COVID-19 or a PUI (household contact)?    X   Have you been diagnosed with COVID-19?    X              What to do next: Answered NO to all: Answered YES to anything:   Proceed with unit schedule Follow the [facility] Inpatient Flowsheet.    

## 2019-01-08 NOTE — [facility] Suicide Risk Assessment (Signed)
[facility] Discharge Suicide Risk Assessment   Principal Problem:  Discharge Diagnoses: Active Problems:   MDD (major depressive disorder), recurrent episode, severe ([place])   Total Time spent with patient: 45 minutes  Musculoskeletal: Strength & Muscle Tone: within normal limits Gait & Station: normal Patient leans: N/A  Psychiatric Specialty Exam: ROS  Blood pressure (!) 139/96, pulse 83, temperature 98.2 F (36.8 C), temperature source Oral, resp. rate 20, height 5\' 10"  (1.778 m), weight 124.7 kg, SpO2 99 %.Body mass index is 39.46 kg/m.  General Appearance: Casual  Eye Contact::  Good  Speech:  Clear and Coherent409  Volume:  Normal  Mood:  Euthymic  Affect:  Full Range  Thought Process:  Coherent and Descriptions of Associations: Intact  Orientation:  Full (Time, Place, and Person)  Thought Content:  Logical  Suicidal Thoughts:  No  Homicidal Thoughts:  No  Memory:  Recent;   Fair  Judgement:  Intact  Insight:  Fair  Psychomotor Activity:  EPS-neg  Concentration:  Good  Recall:  Good  Fund of Knowledge:Good  Language: Good  Akathisia:  Negative  Handed:  Right  AIMS (if indicated):     Assets:  Communication Skills Desire for Improvement  Sleep:  Number of Hours: 3  Cognition: nl  ADL's:  Intact   Mental Status Per Nursing Assessment::   On Admission:  Suicidal ideation indicated by patient  Demographic Factors:  Male  Loss Factors: Decrease in vocational status  Historical Factors: NA  Risk Reduction Factors:   Religious beliefs about death  Continued Clinical Symptoms:  Dysthymia  Cognitive Features That Contribute To Risk:  None    Suicide Risk:  Mild:  Suicidal ideation of limited frequency, intensity, duration, and specificity.  There are no identifiable plans, no associated intent, mild dysphoria and related symptoms, good self-control (both objective and subjective assessment), few other risk factors, and identifiable protective factors,  including available and accessible social support.  Follow-up Information    [facility] Follow up on 01/12/2019.   Why: Medication management appointment is Friday, 6/19 at 2:30p.  Paramacologist appointment is Wednesday, 6/24 at 9:30a.  Therapy appointment is Wednesday, 6/24 at 12:00p.  Providers will contact you regarding how appointments will be conducted.    Contact information: [place] [address] [phone]        [facility] Follow up on 04/25/2019.   Why: Primary care appointment is Wednesday, 9/30 at 11:00a.  Please bring your current medications and discharge paperwork from this hospitalization.  Contact information: [place] [address] Ph: [phone] fx: [phone]       [facility] Follow up on 01/18/2019.   Why: Follow up appointment with diabetes and hypertension is Thursday, 6/25 at 2:30p.  Your appointment will be over the phone and the provider will contact you.  If you wish to have a virtual appointment please download the MyChart app.  Contact information: [place] [address] [phone]          Plan Of Care/Follow-up recommendations:  Activity:  full  Aseret Hoffman, MD 01/08/2019, 11:52 AM

## 2019-01-08 NOTE — Progress Notes (Signed)
Inpatient Diabetes Program Recommendations  AACE/ADA: New Consensus Statement on Inpatient Glycemic Control (2015)  Target Ranges:  Prepandial:   less than 140 mg/dL      Peak postprandial:   less than 180 mg/dL (1-2 hours)      Critically ill patients:  140 - 180 mg/dL   Lab Results  Component Value Date   GLUCAP 281 (H) 01/08/2019   HGBA1C 12.0 (H) 01/03/2019    Review of Glycemic Control  HgbA1C - 12%. New onset DM. Pt will need to go home on basal/bolus insulin. Will need to f/u with PCP at [facility] for diabetes management.  Blood sugars over past 24H - 178-281 mg/dL Continue to titrate both Lantus and Novolog.  Inpatient Diabetes Program Recommendations:     Increase Lantus to 35 units QD Increase Novolog to 8 units tidwc if pt eats > 50% meal.  RN to provide on-going diabetes education regarding survival skills, insulin administration.  Pt will need to f/u with PCP at [facility] after discharge.  Ordered Living Well book and insulin starter kit. Secure text sent to [facility], RN. Continue to follow.   Thank you. Lorenda Peck, RD, LDN, CDE Inpatient Diabetes Coordinator [phone]

## 2019-01-18 ENCOUNTER — Ambulatory Visit: Payer: Self-pay | Attending: Family Medicine | Admitting: Physician Assistant

## 2019-01-18 ENCOUNTER — Other Ambulatory Visit: Payer: Self-pay

## 2019-01-18 NOTE — Progress Notes (Signed)
Patient ID: Quintell Bonnin, male   DOB: 1960-12-07, 58 y.o.   MRN: [id number]  After hospitalization 6/10-6/15/2020 after being IVC.    From discharge summary: Hospital Course:   As above patient was hospitalized due to safety concerns and his version of events completely contradicted the version told by other examiners and the initial reports.  He was always lobbying for discharge but I felt we simply could not discharge him upon admission because the mismatch and information was too drastic and further he did not have his sister's phone number by his report and I had no collateral information.  So the patient was monitored over the weekend and he continued to display no dangerous behaviors lobby for discharge and insist that he was not suicidal.  Dr. Parke Poisson felt the patient probably suffered from sleep apnea based on his physique and I think this is accurate so he used mirtazapine rather than a benzodiazepine for insomnia and the patient only slept a couple of hours but stated he thought it worked better in the pill form rather than the sublingual form.  By the morning of the 15th he continued to ask for discharge stating "what you need for me so that I can go" and I explained to him I needed reassurance is that he could contract for safety and he could he insisted he was not suicidal, I still could not get collateral information as he insisted he did know his sister's phone number at any rate he denied suicidal thoughts plans or intent and had been monitored through the weekend displayed no danger behaviors was coherent and goal-directed in his thought processes so there is no reason to keep him against his will at this point.

## 2019-01-29 ENCOUNTER — Observation Stay (HOSPITAL_COMMUNITY)
Admission: RE | Admit: 2019-01-29 | Discharge: 2019-01-30 | Disposition: A | Discharge status: Other Acute Inpatient Hospital | Payer: No Typology Code available for payment source | Attending: Emergency Medicine | Admitting: Emergency Medicine

## 2019-01-29 ENCOUNTER — Other Ambulatory Visit: Payer: Self-pay

## 2019-01-29 ENCOUNTER — Encounter (HOSPITAL_COMMUNITY): Payer: Self-pay | Admitting: *Deleted

## 2019-01-29 DIAGNOSIS — Z9114 Patient's other noncompliance with medication regimen: Secondary | ICD-10-CM | POA: Insufficient documentation

## 2019-01-29 DIAGNOSIS — Z79899 Other long term (current) drug therapy: Secondary | ICD-10-CM | POA: Diagnosis not present

## 2019-01-29 DIAGNOSIS — F4310 Post-traumatic stress disorder, unspecified: Secondary | ICD-10-CM | POA: Insufficient documentation

## 2019-01-29 DIAGNOSIS — I10 Essential (primary) hypertension: Secondary | ICD-10-CM | POA: Insufficient documentation

## 2019-01-29 DIAGNOSIS — F332 Major depressive disorder, recurrent severe without psychotic features: Secondary | ICD-10-CM | POA: Insufficient documentation

## 2019-01-29 DIAGNOSIS — Z1159 Encounter for screening for other viral diseases: Secondary | ICD-10-CM | POA: Diagnosis not present

## 2019-01-29 DIAGNOSIS — R739 Hyperglycemia, unspecified: Secondary | ICD-10-CM

## 2019-01-29 DIAGNOSIS — G4700 Insomnia, unspecified: Secondary | ICD-10-CM | POA: Insufficient documentation

## 2019-01-29 DIAGNOSIS — M1990 Unspecified osteoarthritis, unspecified site: Secondary | ICD-10-CM | POA: Diagnosis not present

## 2019-01-29 DIAGNOSIS — Z7984 Long term (current) use of oral hypoglycemic drugs: Secondary | ICD-10-CM | POA: Diagnosis not present

## 2019-01-29 DIAGNOSIS — R45851 Suicidal ideations: Principal | ICD-10-CM | POA: Insufficient documentation

## 2019-01-29 LAB — CBC
HCT: 49.7 % (ref 39.0–52.0)
Hemoglobin: 17.6 g/dL — ABNORMAL HIGH (ref 13.0–17.0)
MCH: 31.4 pg (ref 26.0–34.0)
MCHC: 35.4 g/dL (ref 30.0–36.0)
MCV: 88.6 fL (ref 80.0–100.0)
Platelets: 274 10*3/uL (ref 150–400)
RBC: 5.61 MIL/uL (ref 4.22–5.81)
RDW: 13.1 % (ref 11.5–15.5)
WBC: 13.6 10*3/uL — ABNORMAL HIGH (ref 4.0–10.5)
nRBC: 0 % (ref 0.0–0.2)

## 2019-01-29 LAB — HEMOGLOBIN A1C
Hgb A1c MFr Bld: 12.7 % — ABNORMAL HIGH (ref 4.8–5.6)
Mean Plasma Glucose: 317.79 mg/dL

## 2019-01-29 LAB — COMPREHENSIVE METABOLIC PANEL
ALT: 44 U/L (ref 0–44)
AST: 50 U/L — ABNORMAL HIGH (ref 15–41)
Albumin: 4.1 g/dL (ref 3.5–5.0)
Alkaline Phosphatase: 64 U/L (ref 38–126)
Anion gap: 16 — ABNORMAL HIGH (ref 5–15)
BUN: 29 mg/dL — ABNORMAL HIGH (ref 6–20)
CO2: 14 mmol/L — ABNORMAL LOW (ref 22–32)
Calcium: 10.6 mg/dL — ABNORMAL HIGH (ref 8.9–10.3)
Chloride: 94 mmol/L — ABNORMAL LOW (ref 98–111)
Creatinine, Ser: 0.97 mg/dL (ref 0.61–1.24)
GFR calc Af Amer: >60 mL/min (ref >60)
GFR calc non Af Amer: >60 mL/min (ref >60)
Glucose, Bld: 464 mg/dL — ABNORMAL HIGH (ref 70–99)
Potassium: 5.6 mmol/L — ABNORMAL HIGH (ref 3.5–5.1)
Sodium: 124 mmol/L — ABNORMAL LOW (ref 135–145)
Total Bilirubin: 2.2 mg/dL — ABNORMAL HIGH (ref 0.3–1.2)
Total Protein: 7.7 g/dL (ref 6.5–8.1)

## 2019-01-29 LAB — GLUCOSE, CAPILLARY
Glucose-Capillary: 475 mg/dL — ABNORMAL HIGH (ref 70–99)
Glucose-Capillary: 482 mg/dL — ABNORMAL HIGH (ref 70–99)
Glucose-Capillary: 450 mg/dL — ABNORMAL HIGH (ref 70–99)
Glucose-Capillary: 452 mg/dL — ABNORMAL HIGH (ref 70–99)

## 2019-01-29 LAB — ETHANOL: Alcohol, Ethyl (B): <10 mg/dL (ref <10)

## 2019-01-29 LAB — LIPID PANEL
Cholesterol: 656 mg/dL — ABNORMAL HIGH (ref 0–200)
HDL: 30 mg/dL — ABNORMAL LOW (ref >40)
Total CHOL/HDL Ratio: 21.9 RATIO
Triglycerides: 6716 mg/dL — ABNORMAL HIGH (ref <150)
VLDL: UNDETERMINED mg/dL (ref 0–40)

## 2019-01-29 LAB — SARS CORONAVIRUS 2 BY RT PCR (HOSPITAL ORDER, PERFORMED IN ~~LOC~~ HOSPITAL LAB): SARS Coronavirus 2: NEGATIVE

## 2019-01-29 LAB — TSH: TSH: 1.711 u[IU]/mL (ref 0.350–4.500)

## 2019-01-29 LAB — VALPROIC ACID LEVEL: Valproic Acid Lvl: <10 ug/mL — ABNORMAL LOW (ref 50.0–100.0)

## 2019-01-29 MED ORDER — INSULIN ASPART 100 UNIT/ML ~~LOC~~ SOLN
10.0000 [IU] | Freq: Once | SUBCUTANEOUS | Status: AC
  Administered 2019-01-29: 10 [IU] via SUBCUTANEOUS

## 2019-01-29 MED ORDER — NICOTINE 21 MG/24HR TD PT24: 21.0000 mg | MEDICATED_PATCH | Freq: Every day | TRANSDERMAL | Status: DC

## 2019-01-29 MED ORDER — AMLODIPINE BESYLATE 5 MG PO TABS
10.0000 mg | ORAL_TABLET | Freq: Every day | ORAL | Status: DC
  Filled 2019-01-29: qty 2

## 2019-01-29 MED ORDER — INSULIN ASPART 100 UNIT/ML ~~LOC~~ SOLN
0.0000 [IU] | Freq: Three times a day (TID) | SUBCUTANEOUS | Status: DC
  Filled 2019-01-29: qty 0.09

## 2019-01-29 MED ORDER — MIRTAZAPINE 30 MG PO TABS
30.0000 mg | ORAL_TABLET | Freq: Every day | ORAL | Status: DC
  Administered 2019-01-29: 22:00:00 30 mg via ORAL
  Filled 2019-01-29: qty 2

## 2019-01-29 MED ORDER — INSULIN GLARGINE 100 UNIT/ML ~~LOC~~ SOLN
30.0000 [IU] | Freq: Every day | SUBCUTANEOUS | Status: DC
  Administered 2019-01-29: 19:00:00 30 [IU] via SUBCUTANEOUS
  Filled 2019-01-29: qty 0.3

## 2019-01-29 MED ORDER — BENAZEPRIL HCL 20 MG PO TABS
20.0000 mg | ORAL_TABLET | Freq: Every day | ORAL | Status: DC
  Filled 2019-01-29 (×2): qty 1

## 2019-01-29 MED ORDER — METFORMIN HCL 500 MG PO TABS
500.0000 mg | ORAL_TABLET | Freq: Two times a day (BID) | ORAL | Status: DC
  Administered 2019-01-29: 19:00:00 500 mg via ORAL
  Filled 2019-01-29: qty 1

## 2019-01-29 NOTE — Progress Notes (Signed)
CMP results now available. Na 124, K  5.6   Patient received Novolog 10 units at 2055 for CBG 450 and 10 additional units of  Novolog at 2329 for CBG 452.   Patient reports that he has not taken any of his medications since his discharge from [facility] on 01/08/2019.  Will send to [facility] for further evaluation and treatment.    CBG (last 3)  Recent Labs    01/29/19 2035 01/29/19 2137 01/29/19 2244  GLUCAP 482* 450* 452*   CMP     Component Value Date/Time   NA 124 (L) 01/29/2019 1824   K 5.6 (H) 01/29/2019 1824   CL 94 (L) 01/29/2019 1824   CO2 14 (L) 01/29/2019 1824   GLUCOSE 464 (H) 01/29/2019 1824   BUN 29 (H) 01/29/2019 1824   CREATININE 0.97 01/29/2019 1824   CALCIUM 10.6 (H) 01/29/2019 1824   PROT 7.7 01/29/2019 1824   ALBUMIN 4.1 01/29/2019 1824   AST 50 (H) 01/29/2019 1824   ALT 44 01/29/2019 1824   ALKPHOS 64 01/29/2019 1824   BILITOT 2.2 (H) 01/29/2019 1824   GFRNONAA >60 01/29/2019 1824   GFRAA >60 01/29/2019 1824

## 2019-01-29 NOTE — Progress Notes (Signed)
58 y.o.malewho presented to [facility] with the police as a voluntary patient. Patient endorses inability to sleep, frequent thoughts of self harm and suicidal ideation with plan to "suffocate or choke myself out." States that he went off his Bipolar medicine because of insomnia and frequent body jerking. He has superficial cuts to right forearm and left leg.  He also pulled off the toenail on left foot, 3rd toe.   Pt stated that he has not been taking any Diabetes medicine since discharge from this hospital, nearly 4 weeks ago.  CBG was 475 tonight, Lantus and Metformin given as prescribed.  Provider notified that he has not taken Lantus at home. "I'm still [facility] to [facility], at least its not 800 like it was before."  Pt is calm and cooperative, states that he smoke 2 ppd of cigarettes and is eager for a patch.  Denies AVH, denies history of abuse and is able to contract for safety at this time.

## 2019-01-29 NOTE — H&P (Signed)
Behavioral Health Medical Screening Exam  Jacob Brady is an 58 y.o. male patient presents as walk in with complaints of suicidal ideation and plan to hang himself.  Patient unable to contract for safety.  Patient states that he lives alone and his primary support person [employer] who works on Research scientist (physical sciences) thru the [place]   Total Time spent with patient: 30 minutes  Psychiatric Specialty Exam: Physical Exam  Nursing note and vitals reviewed. Constitutional: He is oriented to person, place, and time. He appears well-developed and well-nourished. No distress.  Neck: Normal range of motion.  Respiratory: Effort normal.  Musculoskeletal: Normal range of motion.  Neurological: He is alert and oriented to person, place, and time.  Skin: Skin is warm and dry.  Patient has multiple superficial self inflicted scratches bilateral forearms.  Patient also pulled toe nail (left foot, 3rd toe) off.  No noted s/s of infection noted; scabbed over  Psychiatric: His speech is normal and behavior is normal. His mood appears anxious. Cognition and memory are normal. He expresses impulsivity. He exhibits a depressed mood. He expresses suicidal ideation. He expresses suicidal plans.    Review of Systems  Psychiatric/Behavioral: Positive for depression and suicidal ideas. The patient is nervous/anxious and has insomnia.        Reporting jerking of arms and led when in bed; recent changes in medication Latuda to Depakote but only took Depakote 4 day before stopping  All other systems reviewed and are negative.   Blood pressure (!) 151/104, pulse (!) 103, temperature 98.3 F (36.8 C), temperature source Oral, SpO2 97 %.There is no height or weight on file to calculate BMI.  General Appearance: Casual and Disheveled  Eye Contact:  Good  Speech:  Clear and Coherent and Normal Rate  Volume:  Normal  Mood:  Anxious, Depressed and Hopeless  Affect:  Depressed  Thought Process:  Coherent and Goal Directed   Orientation:  Full (Time, Place, and Person)  Thought Content:  Denies hallucinations, delusions, and paranoia  Suicidal Thoughts:  Yes.  with intent/plan  Homicidal Thoughts:  No  Memory:  Immediate;   Good Recent;   Good  Judgement:  Impaired  Insight:  Lacking  Psychomotor Activity:  Normal  Concentration: Concentration: Good and Attention Span: Good  Recall:  Good  Fund of Knowledge:Good  Language: Good  Akathisia:  No  Handed:  Right  AIMS (if indicated):     Assets:  Communication Skills Desire for Improvement Housing  Sleep:       Musculoskeletal: Strength & Muscle Tone: within normal limits Gait & Station: normal Patient leans: N/A  Blood pressure (!) 151/104, pulse (!) 103, temperature 98.3 F (36.8 C), temperature source Oral, SpO2 97 %.  Recommendations:  Inpatient psychiatric treatment  Based on my evaluation the patient does not appear to have an emergency medical condition.  Kaushal Vannice, NP 01/29/2019, 4:38 PM

## 2019-01-29 NOTE — H&P (Signed)
BH Observation Unit Provider Admission PAA/H&P  Patient Identification: Jacob Brady Petrosino MRN:  [id number] Date of Evaluation:  01/29/2019 Chief Complaint:  SI Principal Diagnosis: Severe recurrent major depression without psychotic features (HCC) Diagnosis:  Principal Problem:   Severe recurrent major depression without psychotic features (HCC) Active Problems:   PTSD (post-traumatic stress disorder)   MDD (major depressive disorder), recurrent episode, severe (HCC)  History of Present Illness: Per TTS Assessment Note, reviewed by this provider: Maryan Brady Brady is an 58 y.o. male who [facility] as a voluntary patient. Patient states that [facility] and he states that he was prescribed Depakote.  Patient states that he had an adverse reaction to the medication and he states that it made his upper body shake.  He states that the medication has not been working and he states that he has not been sleeping.  He states that his anxiety and frustration levels have been high and he states that he has ripped off some toenails and scratched his arms when he was agitated.  He states that he sleeps 2-4 hours per night.  Patient states that he has been diagnosed with non-combat PTSD.  He states that all weekend that he had thoughts of hanging himself, but decided to hold out until today to [place] and ask for [place].  He states that he spoke to the "suicide coordinator, " most likely a suicide prevention coordinator who told him to come to the hospital to be admitted.  Patient denies any HI/Psychosis and states that he has no current drug or alcohol usage. Patient states that he is divorced, he lives alone and he states that he is on disability.  He states that he has a 56 year old daughter.  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  He denies any current legal involvement and  he states that he has no access to weapons.  Patient presents as oriented and alert.  His thoughts organized and his memory intact.  However, patient's judgment, insight and impulse control appear to be impaired.  He does not appear to be responding to any internal stimuli.  His mood is depressed and his affect flat.  Patient had freedom of movement, but he was moderately anxious.  Patient unable to contract for safety   Associated Signs/Symptoms: Depression Symptoms:  depressed mood, psychomotor agitation, hopelessness, suicidal thoughts with specific plan, anxiety, (Hypo) Manic Symptoms:  Impulsivity, Labiality of Mood, Anxiety Symptoms:  Excessive Worry, Psychotic Symptoms:  Patient denies any psychotic symptoms but states that he is feeling the jerking of extremities upper and lower and scratcing  PTSD Symptoms: Had a traumatic exposure:  Reports PTSD related to non combat war Total Time spent with patient: 30 minutes  Past Psychiatric History: PTSD and Major Depressive Disorder  Is the patient at risk to self? Yes.    Has the patient been a risk to self in the past 6 months? Yes.    Has the patient been a risk to self within the distant past? Yes.    Is the patient a risk to others? No.  Has the patient been a risk to others in the past 6 months? No.  Has the patient been a risk to others within the distant past? No.   Prior Inpatient Therapy: Prior Inpatient Therapy: Yes Prior Therapy Dates: 3 weeks ago [facility] Reason for Treatment: depression and anxiety Prior Outpatient Therapy: Prior Outpatient Therapy: Yes Prior Therapy Dates: active [facility] Reason for Treatment: (depression and anxiety) Does patient have an ACCT team?: No Does patient have Intensive In-House Services?  : No Does patient have [facility] services? : No Does patient have P4CC services?: No  Alcohol Screening:   Substance Abuse History  in the last 12 months:  No. Consequences of Substance Abuse: NA Previous Psychotropic Medications: Yes  Psychological Evaluations: Yes  Past Medical History:  Past Medical History:  Diagnosis Date  . Arthritis   . Diabetes mellitus without complication (HCC)   . Hypertension   . PTSD (post-traumatic stress disorder)     Past Surgical History:  Procedure Laterality Date  . CHOLECYSTECTOMY    . WRIST FRACTURE SURGERY Right    Family History: No family history on file. Family Psychiatric History: Denies Tobacco Screening:   Social History:  Social History   Substance and Sexual Activity  Alcohol Use No     Social History   Substance and Sexual Activity  Drug Use No    Additional Social History: Marital status: Divorced    Pain Medications: see MAR Prescriptions: see MAR Over the Counter: see MAR History of alcohol / drug use?: No history of alcohol / drug abuse Longest period of sobriety (when/how long): NA                    Allergies:   Allergies  Allergen Reactions  . Hctz [Hydrochlorothiazide] Other (See Comments)    Unknown, "they just told me not to take it anymore"  . Seroquel [Quetiapine Fumarate] Other (See Comments)    Elevates liver enzymes    Lab Results: No results found for this or any previous visit (from the past 48 hour(s)).  Blood Alcohol level:  Lab Results  Component Value Date   ETH <10 01/03/2019    Metabolic Disorder Labs:  Lab Results  Component Value Date   HGBA1C 12.0 (H) 01/03/2019   MPG 297.7 01/03/2019   MPG 189 08/03/2015   No results found for: PROLACTIN Lab Results  Component Value Date   CHOL 449 (H) 01/03/2019   TRIG 3,631 (H) 01/03/2019   HDL NOT REPORTED DUE TO HIGH TRIGLYCERIDES 01/03/2019   CHOLHDL NOT REPORTED DUE TO HIGH TRIGLYCERIDES 01/03/2019   VLDL UNABLE TO CALCULATE IF TRIGLYCERIDE OVER 400 mg/dL 16/10/960406/04/2019   LDLCALC NOT CALCULATED 01/03/2019   LDLCALC 105 (H) 08/03/2015    Current  Medications: Current Facility-Administered Medications  Medication Dose Route Frequency Provider Last Rate Last Dose  . amLODipine (NORVASC) tablet 10 mg  10 mg Oral Daily Kemonie Cutillo B, NP      . benazepril (LOTENSIN) tablet 20 mg  20 mg Oral Daily Skyler Dusing B, NP      . [START ON 01/30/2019] insulin aspart (novoLOG) injection 0-9 Units  0-9 Units Subcutaneous TID WC Loyd Marhefka B, NP      . insulin glargine (LANTUS) injection 30 Units  30 Units Subcutaneous Daily Vinetta Brach B, NP      . metFORMIN (GLUCOPHAGE) tablet 500 mg  500 mg Oral BID WC Rika Daughdrill B, NP      . mirtazapine (REMERON) tablet 30 mg  30 mg Oral QHS Oluwatobiloba Martin B, NP       PTA Medications: Medications Prior to Admission  Medication Sig Dispense Refill Last Dose  . amLODipine (NORVASC) 10 MG tablet Take 1 tablet (10  mg total) by mouth daily. 30 tablet 0   . benazepril (LOTENSIN) 20 MG tablet Take 1 tablet (20 mg total) by mouth daily. 30 tablet 0   . insulin glargine (LANTUS) 100 UNIT/ML injection Inject 0.3 mLs (30 Units total) into the skin daily. 10 mL 0   . metFORMIN (GLUCOPHAGE) 500 MG tablet Take 1 tablet (500 mg total) by mouth 2 (two) times daily with a meal. 60 tablet 0   . mirtazapine (REMERON) 30 MG tablet Take 1 tablet (30 mg total) by mouth at bedtime. 30 tablet 0    Psychiatric Specialty Exam: Physical Exam  Nursing note and vitals reviewed. Constitutional: He is oriented to person, place, and time. He appears well-developed and well-nourished. No distress.  Neck: Normal range of motion.  Respiratory: Effort normal.  Musculoskeletal: Normal range of motion.  Neurological: He is alert and oriented to person, place, and time.  Skin: Skin is warm and dry.  Patient has multiple superficial self inflicted scratches bilateral forearms.  Patient also pulled toe nail (left foot, 3rd toe) off.  No noted s/s of infection noted; scabbed over  Psychiatric: His speech is normal and behavior is  normal. His mood appears anxious. Cognition and memory are normal. He expresses impulsivity. He exhibits a depressed mood. He expresses suicidal ideation. He expresses suicidal plans.    Review of Systems  Psychiatric/Behavioral: Positive for depression and suicidal ideas. The patient is nervous/anxious and has insomnia.        Reporting jerking of arms and led when in bed; recent changes in medication Latuda to Depakote but only took Depakote 4 day before stopping  All other systems reviewed and are negative.   Blood pressure (!) 151/104, pulse (!) 103, temperature 98.3 F (36.8 C), temperature source Oral, SpO2 97 %.There is no height or weight on file to calculate BMI.  General Appearance: Casual and Disheveled  Eye Contact:  Good  Speech:  Clear and Coherent and Normal Rate  Volume:  Normal  Mood:  Anxious, Depressed and Hopeless  Affect:  Depressed  Thought Process:  Coherent and Goal Directed  Orientation:  Full (Time, Place, and Person)  Thought Content:  Denies hallucinations, delusions, and paranoia  Suicidal Thoughts:  Yes.  with intent/plan  Homicidal Thoughts:  No  Memory:  Immediate;   Good Recent;   Good  Judgement:  Impaired  Insight:  Lacking  Psychomotor Activity:  Normal  Concentration: Concentration: Good and Attention Span: Good  Recall:  Good  Fund of Knowledge:Good  Language: Good  Akathisia:  No  Handed:  Right  AIMS (if indicated):     Assets:  Communication Skills Desire for Improvement Housing  Sleep:       Musculoskeletal: Strength & Muscle Tone: within normal limits Gait & Station: normal Patient leans: N/A      Treatment Plan Summary: Medication management and Plan Observation overnight; restart home medication; labs ordered  Observation Level/Precautions:  15 minute checks Laboratory:  CBC Chemistry Profile HbAIC UDS UA ETOH  Disposition:  Observation over night reassess tomorrow     Ashritha Desrosiers, NP 7/6/20205:17 PM

## 2019-01-29 NOTE — BH Assessment (Signed)
Assessment Note  Jacob Brady is an 58 y.o. male who presented to [facility] with the police as a voluntary patient. Patient states that he was just released from [facility] 3 weeks ago and he states that he was prescribed Depakote.  Patient states that he had an adverse reaction to the medication and he states that it made his upper body shake.  He states that the medication has not been working and he states that he has not been sleeping.  He states that his anxiety and frustration levels have been high and he states that he has ripped off some toenails and sratched his arms when he was agitated.  He states that he sleeps 2-4 hours per night.  Patient states that he has been diagnosed with non-combat PTSD.  He states that all weekend that he had thoughts of hanging himself, but decided to hold out until today to talk to a [place] representative and ask for direction.  He states that he spoke to the "suicide coordinator, " most likely a suicide prevention coordinator who told him to come to the hospital to be admitted.  Patient denies any HI/Psychosis and states that he has no current drug or alcohol usage. Patient states that he is divorced, he lives alone and he states that he is on disability.  He states that he has a 75 year old daughter.  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  He denies any current legal involvement and he states that he has no access to weapons.   Patient presents as oriented and alert.  His thoughts organized and his memory intact.  However, patient's judgment, insight and impulse control appear to be impaired.  He does not appear to be responding to any internal stimuli.  His mood is depressed and his affect flat.  Patient had freedom of movement, but he was moderately anxious.  Diagnosis: F##.# MDD single episode severe without psychosis  Past Medical History:  Past Medical History:  Diagnosis Date  . Arthritis   .  Diabetes mellitus without complication ([place])   . Hypertension   . PTSD (post-traumatic stress disorder)     Past Surgical History:  Procedure Laterality Date  . CHOLECYSTECTOMY    . WRIST FRACTURE SURGERY Right     Family History: No family history on file.  Social History:  reports that he has been smoking. He has never used smokeless tobacco. He reports that he does not drink alcohol or use drugs.  Additional Social History:  Alcohol / Drug Use Pain Medications: see MAR Prescriptions: see MAR Over the Counter: see MAR History of alcohol / drug use?: No history of alcohol / drug abuse Longest period of sobriety (when/how long): NA  CIWA: CIWA-Ar BP: (!) 151/104 Pulse Rate: (!) 103 COWS:    Allergies:  Allergies  Allergen Reactions  . Hctz [Hydrochlorothiazide] Other (See Comments)    Unknown, "they just told me not to take it anymore"  . Seroquel [Quetiapine Fumarate] Other (See Comments)    Elevates liver enzymes     Home Medications:  No medications prior to admission.    OB/GYN Status:  No LMP for male patient.  General Assessment Data Location of Assessment: [facility] Assessment Services TTS Assessment: In system Is this a Tele or Face-to-Face Assessment?: Face-to-Face Is this an Initial Assessment or a Re-assessment for this encounter?: Initial Assessment Patient Accompanied by:: Other(police) Language Other than English: No Living Arrangements:  Other (Comment)(lives on his own) What gender do you identify as?: Male Marital status: Divorced Living Arrangements: Alone Can pt return to current living arrangement?: Yes Admission Status: Voluntary Is patient capable of signing voluntary admission?: Yes Referral Source: Self/Family/Friend Insurance type: Medicare  Medical Screening Exam [facility] Medical Exam completed: Yes  Crisis Care Plan Living Arrangements: Alone Legal Guardian: Other:(self) [facility]  [facility]  Education Status Is patient currently in school?: No Is the patient employed, unemployed or receiving disability?: Receiving disability income  Risk to self with the past 6 months Suicidal Ideation: Yes-Currently Present Has patient been a risk to self within the past 6 months prior to admission? : No Suicidal Intent: No Has patient had any suicidal intent within the past 6 months prior to admission? : No Is patient at risk for suicide?: Yes Suicidal Plan?: Yes-Currently Present Has patient had any suicidal plan within the past 6 months prior to admission? : Yes Specify Current Suicidal Plan: to hang self Access to Means: No What has been your use of drugs/alcohol within the last 12 months?: (none) Previous Attempts/Gestures: Yes How many times?: 4 Other Self Harm Risks: minimal support Triggers for Past Attempts: None known Intentional Self Injurious Behavior: Damaging Comment - Self Injurious Behavior: has scratches on his arms Family Suicide History: No Recent stressful life event(s): Other (Comment)(none reported) Persecutory voices/beliefs?: No Depression: Yes Depression Symptoms: Despondent, Insomnia, Isolating, Loss of interest in usual pleasures, Feeling worthless/self pity Substance abuse history and/or treatment for substance abuse?: No Suicide prevention information given to non-admitted patients: Not applicable  Risk to Others within the past 6 months Homicidal Ideation: No Does patient have any lifetime risk of violence toward others beyond the six months prior to admission? : No Thoughts of Harm to Others: No Current Homicidal Intent: No Current Homicidal Plan: No Access to Homicidal Means: No Identified Victim: none History of harm to others?: No Assessment of Violence: None Noted Violent Behavior Description: none Does patient have access to weapons?: No Criminal Charges Pending?: No Does patient have a court date: No Is patient  on probation?: No  Psychosis Hallucinations: None noted Delusions: None noted  Mental Status Report Appearance/Hygiene: Disheveled Eye Contact: Good Motor Activity: Restlessness Speech: Logical/coherent Level of Consciousness: Alert Mood: Depressed, Anxious Affect: Anxious, Apathetic Anxiety Level: Severe Thought Processes: Coherent, Relevant Judgement: Partial Orientation: Person, Place, Time, Situation Obsessive Compulsive Thoughts/Behaviors: Moderate  Cognitive Functioning Concentration: Decreased Memory: Recent Intact, Remote Intact Is patient IDD: No Insight: Fair Impulse Control: Poor Appetite: Fair Have you had any weight changes? : No Change Sleep: Decreased Total Hours of Sleep: (4) Vegetative Symptoms: Decreased grooming  ADLScreening [facility]) Patient's cognitive ability adequate to safely complete daily activities?: Yes Patient able to express need for assistance with ADLs?: Yes Independently performs ADLs?: Yes (appropriate for developmental age)  Prior Inpatient Therapy Prior Inpatient Therapy: Yes Prior Therapy Dates: 3 weeks ago [facility] Reason for Treatment: depression and anxiety  Prior Outpatient Therapy Prior Outpatient Therapy: Yes Prior Therapy Dates: active [facility] Reason for Treatment: (depression and anxiety) Does patient have an ACCT team?: No Does patient have Intensive In-House Services?  : No Does patient have [facility] services? : No Does patient have P4CC services?: No  ADL Screening (condition at time of admission) Patient's cognitive ability adequate to safely complete daily activities?: Yes Is the patient deaf or have difficulty hearing?: No Does the patient have  difficulty seeing, even when wearing glasses/contacts?: No Does the patient have difficulty concentrating, remembering, or making decisions?: No Patient able to express need for  assistance with ADLs?: Yes Does the patient have difficulty dressing or bathing?: No Independently performs ADLs?: Yes (appropriate for developmental age) Does the patient have difficulty walking or climbing stairs?: No Weakness of Legs: None Weakness of Arms/Hands: None  Home Assistive Devices/Equipment Home Assistive Devices/Equipment: None  Therapy Consults (therapy consults require a physician order) PT Evaluation Needed: No OT Evalulation Needed: No Abuse/Neglect Assessment (Assessment to be complete while patient is alone) Abuse/Neglect Assessment Can Be Completed: Yes Physical Abuse: Denies Verbal Abuse: Denies Sexual Abuse: Denies Exploitation of patient/patient's resources: Denies Self-Neglect: Denies Values / Beliefs Cultural Requests During Hospitalization: None Spiritual Requests During Hospitalization: None Consults Spiritual Care Consult Needed: No Social Work Consult Needed: No Merchant navy officerAdvance Directives (For Healthcare) Does Patient Have a Medical Advance Directive?: No Would patient like information on creating a medical advance directive?: No - Patient declined Nutrition Screen- [facility] Adult/WL/AP Has the patient recently lost weight without trying?: No Has the patient been eating poorly because of a decreased appetite?: No Malnutrition Screening Tool Score: 0        Disposition: Per Shuvon Rankin, NP, patient will need to be monitored overnight for safety and re-evaluated in the morning, Disposition Initial Assessment Completed for this Encounter: Yes Disposition of Patient: (Overnight OBS and reassess by Provider tomorrow)  On Site Evaluation by:   Reviewed with Physician:    Arnoldo Lenisanny J Hillarie Harrigan 01/29/2019 4:46 PM

## 2019-01-30 ENCOUNTER — Encounter (HOSPITAL_COMMUNITY): Payer: Self-pay | Admitting: Family Medicine

## 2019-01-30 ENCOUNTER — Observation Stay (HOSPITAL_BASED_OUTPATIENT_CLINIC_OR_DEPARTMENT_OTHER)
Admission: AD | Admit: 2019-01-30 | Discharge: 2019-01-30 | Disposition: A | Discharge status: Home / Self Care | Payer: No Typology Code available for payment source | Source: Intra-hospital | Attending: Psychiatry | Admitting: Psychiatry

## 2019-01-30 ENCOUNTER — Encounter (HOSPITAL_COMMUNITY): Payer: Self-pay | Admitting: Registered Nurse

## 2019-01-30 DIAGNOSIS — I10 Essential (primary) hypertension: Secondary | ICD-10-CM | POA: Insufficient documentation

## 2019-01-30 DIAGNOSIS — E119 Type 2 diabetes mellitus without complications: Secondary | ICD-10-CM | POA: Insufficient documentation

## 2019-01-30 DIAGNOSIS — F332 Major depressive disorder, recurrent severe without psychotic features: Secondary | ICD-10-CM | POA: Diagnosis present

## 2019-01-30 DIAGNOSIS — Z79899 Other long term (current) drug therapy: Secondary | ICD-10-CM | POA: Insufficient documentation

## 2019-01-30 DIAGNOSIS — F4310 Post-traumatic stress disorder, unspecified: Secondary | ICD-10-CM | POA: Insufficient documentation

## 2019-01-30 DIAGNOSIS — F331 Major depressive disorder, recurrent, moderate: Secondary | ICD-10-CM

## 2019-01-30 DIAGNOSIS — M1990 Unspecified osteoarthritis, unspecified site: Secondary | ICD-10-CM | POA: Insufficient documentation

## 2019-01-30 DIAGNOSIS — F17210 Nicotine dependence, cigarettes, uncomplicated: Secondary | ICD-10-CM | POA: Insufficient documentation

## 2019-01-30 DIAGNOSIS — Z794 Long term (current) use of insulin: Secondary | ICD-10-CM | POA: Insufficient documentation

## 2019-01-30 LAB — BASIC METABOLIC PANEL
Anion gap: 12 (ref 5–15)
BUN: 24 mg/dL — ABNORMAL HIGH (ref 6–20)
CO2: 19 mmol/L — ABNORMAL LOW (ref 22–32)
Calcium: 10.2 mg/dL (ref 8.9–10.3)
Chloride: 97 mmol/L — ABNORMAL LOW (ref 98–111)
Creatinine, Ser: 0.84 mg/dL (ref 0.61–1.24)
GFR calc Af Amer: >60 mL/min (ref >60)
GFR calc non Af Amer: >60 mL/min (ref >60)
Glucose, Bld: 296 mg/dL — ABNORMAL HIGH (ref 70–99)
Potassium: 4.9 mmol/L (ref 3.5–5.1)
Sodium: 128 mmol/L — ABNORMAL LOW (ref 135–145)

## 2019-01-30 LAB — BLOOD GAS, VENOUS
Acid-base deficit: 0.7 mmol/L (ref 0.0–2.0)
Bicarbonate: 22.9 mmol/L (ref 20.0–28.0)
FIO2: 21
O2 Saturation: 85.4 %
Patient temperature: 98.6
pCO2, Ven: 36.7 mmHg — ABNORMAL LOW (ref 44.0–60.0)
pH, Ven: 7.412 (ref 7.250–7.430)
pO2, Ven: 50.7 mmHg — ABNORMAL HIGH (ref 32.0–45.0)

## 2019-01-30 LAB — URINALYSIS, ROUTINE W REFLEX MICROSCOPIC
Bilirubin Urine: NEGATIVE
Glucose, UA: >=500 mg/dL — AB
Hgb urine dipstick: NEGATIVE
Ketones, ur: NEGATIVE mg/dL
Leukocytes,Ua: NEGATIVE
Nitrite: NEGATIVE
Protein, ur: NEGATIVE mg/dL
Specific Gravity, Urine: 1.03 (ref 1.005–1.030)
pH: 5 (ref 5.0–8.0)

## 2019-01-30 LAB — RAPID URINE DRUG SCREEN, HOSP PERFORMED
Amphetamines: NOT DETECTED
Barbiturates: NOT DETECTED
Benzodiazepines: NOT DETECTED
Cocaine: NOT DETECTED
Opiates: NOT DETECTED
Tetrahydrocannabinol: NOT DETECTED

## 2019-01-30 LAB — CBC WITH DIFFERENTIAL/PLATELET
Abs Immature Granulocytes: 0.09 10*3/uL — ABNORMAL HIGH (ref 0.00–0.07)
Basophils Absolute: 0.1 10*3/uL (ref 0.0–0.1)
Basophils Relative: 1 %
Eosinophils Absolute: 0.1 10*3/uL (ref 0.0–0.5)
Eosinophils Relative: 1 %
HCT: 47.3 % (ref 39.0–52.0)
Hemoglobin: 16.9 g/dL (ref 13.0–17.0)
Immature Granulocytes: 1 %
Lymphocytes Relative: 36 %
Lymphs Abs: 4.3 10*3/uL — ABNORMAL HIGH (ref 0.7–4.0)
MCH: 31.6 pg (ref 26.0–34.0)
MCHC: 35.7 g/dL (ref 30.0–36.0)
MCV: 88.4 fL (ref 80.0–100.0)
Monocytes Absolute: 0.9 10*3/uL (ref 0.1–1.0)
Monocytes Relative: 8 %
Neutro Abs: 6.3 10*3/uL (ref 1.7–7.7)
Neutrophils Relative %: 53 %
Platelets: 216 10*3/uL (ref 150–400)
RBC: 5.35 MIL/uL (ref 4.22–5.81)
RDW: 13.2 % (ref 11.5–15.5)
WBC: 11.8 10*3/uL — ABNORMAL HIGH (ref 4.0–10.5)
nRBC: 0 % (ref 0.0–0.2)

## 2019-01-30 LAB — URINALYSIS, COMPLETE (UACMP) WITH MICROSCOPIC
Bacteria, UA: NONE SEEN
Bilirubin Urine: NEGATIVE
Glucose, UA: >=500 mg/dL — AB
Ketones, ur: 5 mg/dL — AB
Leukocytes,Ua: NEGATIVE
Nitrite: NEGATIVE
Protein, ur: NEGATIVE mg/dL
Specific Gravity, Urine: 1.028 (ref 1.005–1.030)
pH: 5 (ref 5.0–8.0)

## 2019-01-30 LAB — GLUCOSE, CAPILLARY
Glucose-Capillary: 231 mg/dL — ABNORMAL HIGH (ref 70–99)
Glucose-Capillary: 278 mg/dL — ABNORMAL HIGH (ref 70–99)
Glucose-Capillary: 222 mg/dL — ABNORMAL HIGH (ref 70–99)
Glucose-Capillary: 319 mg/dL — ABNORMAL HIGH (ref 70–99)

## 2019-01-30 MED ORDER — SODIUM CHLORIDE 0.9 % IV BOLUS
1000.0000 mL | Freq: Once | INTRAVENOUS | Status: AC
  Administered 2019-01-30: 02:00:00 1000 mL via INTRAVENOUS

## 2019-01-30 MED ORDER — MAGNESIUM HYDROXIDE 400 MG/5ML PO SUSP: 30.0000 mL | Freq: Every day | ORAL | Status: DC | PRN

## 2019-01-30 MED ORDER — ALUM & MAG HYDROXIDE-SIMETH 200-200-20 MG/5ML PO SUSP: 30.0000 mL | ORAL | Status: DC | PRN

## 2019-01-30 MED ORDER — MIRTAZAPINE 15 MG PO TABS: 30.0000 mg | ORAL_TABLET | Freq: Every day | ORAL | Status: DC

## 2019-01-30 MED ORDER — NICOTINE 21 MG/24HR TD PT24
21.0000 mg | MEDICATED_PATCH | Freq: Every day | TRANSDERMAL | Status: DC
  Administered 2019-01-30: 11:00:00 21 mg via TRANSDERMAL

## 2019-01-30 MED ORDER — INSULIN ASPART 100 UNIT/ML ~~LOC~~ SOLN: 0.0000 [IU] | Freq: Every day | SUBCUTANEOUS | Status: DC

## 2019-01-30 MED ORDER — METFORMIN HCL 500 MG PO TABS: 500.0000 mg | ORAL_TABLET | Freq: Two times a day (BID) | ORAL | 0 refills | Status: DC

## 2019-01-30 MED ORDER — BENAZEPRIL HCL 20 MG PO TABS
20.0000 mg | ORAL_TABLET | Freq: Every day | ORAL | Status: DC
  Administered 2019-01-30: 08:00:00 20 mg via ORAL
  Filled 2019-01-30 (×3): qty 1

## 2019-01-30 MED ORDER — INSULIN GLARGINE 100 UNIT/ML ~~LOC~~ SOLN
30.0000 [IU] | Freq: Every day | SUBCUTANEOUS | Status: DC
  Administered 2019-01-30: 08:00:00 30 [IU] via SUBCUTANEOUS

## 2019-01-30 MED ORDER — METFORMIN HCL 500 MG PO TABS
500.0000 mg | ORAL_TABLET | Freq: Two times a day (BID) | ORAL | Status: DC
  Administered 2019-01-30: 08:00:00 500 mg via ORAL
  Filled 2019-01-30: qty 1

## 2019-01-30 MED ORDER — HYDROXYZINE HCL 25 MG PO TABS
25.0000 mg | ORAL_TABLET | Freq: Three times a day (TID) | ORAL | Status: DC | PRN
  Administered 2019-01-30: 08:00:00 25 mg via ORAL
  Filled 2019-01-30: qty 1

## 2019-01-30 MED ORDER — GABAPENTIN 100 MG PO CAPS
100.0000 mg | ORAL_CAPSULE | Freq: Three times a day (TID) | ORAL | Status: DC
  Administered 2019-01-30: 12:00:00 100 mg via ORAL
  Filled 2019-01-30: qty 1

## 2019-01-30 MED ORDER — GABAPENTIN 100 MG PO CAPS: ORAL_CAPSULE | ORAL | 0 refills | Status: DC

## 2019-01-30 MED ORDER — NICOTINE 21 MG/24HR TD PT24
MEDICATED_PATCH | TRANSDERMAL | Status: AC
  Filled 2019-01-30: qty 1

## 2019-01-30 MED ORDER — INSULIN ASPART 100 UNIT/ML ~~LOC~~ SOLN
0.0000 [IU] | Freq: Three times a day (TID) | SUBCUTANEOUS | Status: DC
  Administered 2019-01-30: 12:00:00 5 [IU] via SUBCUTANEOUS
  Administered 2019-01-30: 3 [IU] via SUBCUTANEOUS

## 2019-01-30 MED ORDER — AMLODIPINE BESYLATE 5 MG PO TABS
10.0000 mg | ORAL_TABLET | Freq: Every day | ORAL | Status: DC
  Administered 2019-01-30: 08:00:00 10 mg via ORAL
  Filled 2019-01-30: qty 2

## 2019-01-30 NOTE — ED Notes (Signed)
Pt's CBG is 319.

## 2019-01-30 NOTE — ED Triage Notes (Signed)
Patient was transported via [place] from [facility] for medical concerns. Patient presented to [facility] voluntary for being suicidal. Patient was released 3 weeks ago and prescribed Depakote. Patient stated he had an adverse reaction to the medication and it has not been helpful. Over the weekend, he was suicidal  with a plan to hang himself. He was seen at [facility] and determined to need further assessment tomorrow. However, he has not been compliment with his medication, particular diabetes medication. While at [facility], he received Novolog 10 units at 20:55 for CBG 450 and 10 additional unites of Novolog at 23:29 for CBG 452.

## 2019-01-30 NOTE — ED Notes (Signed)
Bed: WA09 Expected date:  Expected time:  Means of arrival:  Comments: From [facility], hyperglycemia

## 2019-01-30 NOTE — Progress Notes (Signed)
Patient returned to unit without complaints.  He is calm and cooperative upon return.  Took morning insulin without problems.

## 2019-01-30 NOTE — Discharge Summary (Addendum)
[facility]  01/30/2019 1:37 PM Jacob CharScott Godden  MRN:  [id number] Principal Problem: MDD (major depressive disorder), recurrent episode, moderate (HCC) Discharge Diagnoses: Principal Problem:   MDD (major depressive disorder), recurrent episode, moderate (HCC) Active Problems:   Severe recurrent major depression without psychotic features (HCC)   Subjective: Patient seen face to face by this provider, Dr. Sharma CovertNorman; and chart reviewed and consulted with Dr. Lucianne MussKumar on 01/30/19.  On evaluation Jacob Brady reports he presented as a walk in with complaints of not sleeping related to upper and lower extremities jerking when ever he went to bed.  Patient states that he is feeling better this morning and since he is back on his diabetic medication.  Patient states he has an appointment with his psychiatrist Friday via telephone related to no transport to get there since [facility] is no longer transporting.  [facility]  Patient agreed.  Patient denies suicidal/self-harm/homicidal ideation, psychosis, and paranoia.  Patient states that he has a sister and [employer] as support system and that he felt safe.  Patient is also aware that he needs to speak to his PCP related to restless leg syndrome and keeping his diabetes under control.    During evaluation Jacob Brady is sitting on side of bed; he is alert/oriented x 4; calm/cooperative; and mood congruent with affect.  Patient is speaking in a clear tone at moderate volume, and normal pace; with good eye contact.  His thought process is coherent and relevant; There is no indication that he is currently responding to internal/external stimuli or experiencing delusional thought content.  Patient denies suicidal/self-harm/homicidal ideation, psychosis, and paranoia.  Patient has remained calm throughout assessment and has answered questions appropriately.   Total Time spent with  patient: 45 minutes  Past Psychiatric History: PTSD, Major depression  Past Medical History:  Past Medical History:  Diagnosis Date  . Arthritis   . Diabetes mellitus without complication (HCC)   . Hypertension   . PTSD (post-traumatic stress disorder)     Past Surgical History:  Procedure Laterality Date  . CHOLECYSTECTOMY    . WRIST FRACTURE SURGERY Right    Family History: History reviewed. No pertinent family history. Family Psychiatric  History: Sister-mental health problems.  Social History:  Social History   Substance and Sexual Activity  Alcohol Use No     Social History   Substance and Sexual Activity  Drug Use No    Social History   Socioeconomic History  . Marital status: Divorced    Spouse name: Not on file  . Number of children: Not on file  . Years of education: Not on file  . Highest education level: Not on file  Occupational History  . Not on file  Social Needs  . Financial resource strain: Not on file  . Food insecurity    Worry: Not on file    Inability: Not on file  . Transportation needs    Medical: Not on file    Non-medical: Not on file  Tobacco Use  . Smoking status: Current Every Day Smoker    Packs/day: 2.00  . Smokeless tobacco: Never Used  Substance and Sexual Activity  . Alcohol use: No  . Drug use: No  . Sexual activity: Not Currently  Lifestyle  . Physical activity    Days per week: Not on file    Minutes per session: Not on file  . Stress: Not on file  Relationships  . Social Herbalist on phone: Not on file    Gets together: Not on file    Attends religious service: Not on file    Active member of club or organization: Not on file    Attends meetings of clubs or organizations: Not on file    Relationship status: Not on file  Other Topics Concern  . Not on file  Social History Narrative  . Not on file    Has this patient used any form of tobacco in the last 30 days? (Cigarettes, Smokeless Tobacco,  Cigars, and/or Pipes) A prescription for an FDA-approved tobacco cessation medication was offered at discharge and the patient refused  Current Medications: Current Facility-Administered Medications  Medication Dose Route Frequency Provider Last Rate Last Dose  . alum & mag hydroxide-simeth (MAALOX/MYLANTA) 200-200-20 MG/5ML suspension 30 mL  30 mL Oral Q4H PRN Lindon Romp A, NP      . amLODipine (NORVASC) tablet 10 mg  10 mg Oral Daily Lindon Romp A, NP   10 mg at 01/30/19 7169  . benazepril (LOTENSIN) tablet 20 mg  20 mg Oral Daily Lindon Romp A, NP   20 mg at 01/30/19 6789  . gabapentin (NEURONTIN) capsule 100 mg  100 mg Oral TID Rankin, Shuvon B, NP   100 mg at 01/30/19 1200  . hydrOXYzine (ATARAX/VISTARIL) tablet 25 mg  25 mg Oral TID PRN Lindon Romp A, NP   25 mg at 01/30/19 0814  . insulin aspart (novoLOG) injection 0-5 Units  0-5 Units Subcutaneous QHS Lindon Romp A, NP      . insulin aspart (novoLOG) injection 0-9 Units  0-9 Units Subcutaneous TID WC Lindon Romp A, NP   5 Units at 01/30/19 1210  . insulin glargine (LANTUS) injection 30 Units  30 Units Subcutaneous Daily Lindon Romp A, NP   30 Units at 01/30/19 0813  . magnesium hydroxide (MILK OF MAGNESIA) suspension 30 mL  30 mL Oral Daily PRN Lindon Romp A, NP      . metFORMIN (GLUCOPHAGE) tablet 500 mg  500 mg Oral BID WC Lindon Romp A, NP   500 mg at 01/30/19 0813  . mirtazapine (REMERON) tablet 30 mg  30 mg Oral QHS Berry, Jason A, NP      . nicotine (NICODERM CQ - dosed in mg/24 hours) 21 mg/24hr patch           . nicotine (NICODERM CQ - dosed in mg/24 hours) patch 21 mg  21 mg Transdermal Daily Rankin, Shuvon B, NP   21 mg at 01/30/19 1037   PTA Medications: Medications Prior to Admission  Medication Sig Dispense Refill Last Dose  . amLODipine (NORVASC) 10 MG tablet Take 1 tablet (10 mg total) by mouth daily. 30 tablet 0   . benazepril (LOTENSIN) 20 MG tablet Take 1 tablet (20 mg total) by mouth daily. 30 tablet 0   .  insulin glargine (LANTUS) 100 UNIT/ML injection Inject 0.3 mLs (30 Units total) into the skin daily. (Patient not taking: Reported on 01/30/2019) 10 mL 0   . metFORMIN (GLUCOPHAGE) 500 MG tablet Take 1 tablet (500 mg total) by mouth 2 (two) times daily with a meal. (Patient not taking: Reported on 01/30/2019) 60 tablet 0   . mirtazapine (REMERON) 30 MG tablet Take 1 tablet (30 mg total) by mouth at bedtime. (Patient not taking: Reported on 01/30/2019) 30 tablet 0     Musculoskeletal: Strength & Muscle Tone: within normal limits Gait &  Station: normal Patient leans: N/A  Psychiatric Specialty Exam: Physical Exam  Nursing note and vitals reviewed. Constitutional: He is oriented to person, place, and time. He appears well-developed and well-nourished.  Respiratory: Effort normal.  Musculoskeletal: Normal range of motion.  Neurological: He is alert and oriented to person, place, and time.  Skin: Skin is warm and dry.  Psychiatric: He has a normal mood and affect. His behavior is normal. Judgment and thought content normal. Cognition and memory are normal.    Review of Systems  Psychiatric/Behavioral: Depression: Stable. Hallucinations: Denies. Substance abuse: Denies. Suicidal ideas: Denies. Nervous/anxious: Stable.   All other systems reviewed and are negative.   There were no vitals taken for this visit.There is no height or weight on file to calculate BMI.  General Appearance: Casual  Eye Contact:  Good  Speech:  Clear and Coherent and Normal Rate  Volume:  Normal  Mood:  Appropriate  Affect:  Appropriate and Congruent  Thought Process:  Coherent and Goal Directed  Orientation:  Full (Time, Place, and Person)  Thought Content:  WDL  Suicidal Thoughts:  No  Homicidal Thoughts:  No  Memory:  Immediate;   Good Recent;   Good Remote;   Good  Judgement:  Intact  Insight:  Present  Psychomotor Activity:  Normal  Concentration:  Concentration: Good and Attention Span: Good  Recall:  Good   Fund of Knowledge:  Good  Language:  Good  Akathisia:  No  Handed:  Right  AIMS (if indicated):   N/A  Assets:  Communication Skills Desire for Improvement Housing Social Support  ADL's:  Intact  Cognition:  WNL  Sleep:   N/A     Demographic Factors:  Male, Divorced or widowed, Caucasian and Living alone  Loss Factors: NA  Historical Factors: NA  Risk Reduction Factors:   Sense of responsibility to family, Positive social support and Positive therapeutic relationship  Continued Clinical Symptoms:  Previous Psychiatric Diagnoses and Treatments  Cognitive Features That Contribute To Risk:  None    Suicide Risk:  Minimal: No identifiable suicidal ideation.  Patients presenting with no risk factors but with morbid ruminations; may be classified as minimal risk based on the severity of the depressive symptoms    Plan Of Care/Follow-up recommendations:  Activity:  As tolerated Diet:  Heart healthy, low carb Other:  follow up with resources given  Disposition: No evidence of imminent risk to self or others at present.   Patient does not meet criteria for psychiatric inpatient admission. Supportive therapy provided about ongoing stressors. Discussed crisis plan, support from social network, calling 911, coming to the Emergency Department, and calling Suicide Hotline.   Shuvon Rankin, NP 01/30/2019, 1:37 PM    Patient seen face-to-face for psychiatric evaluation, chart reviewed and case discussed with the physician extender and developed treatment plan. Reviewed the information documented and agree with the treatment plan.  Juanetta BeetsJacqueline , DO 01/30/19 2:36 PM

## 2019-01-30 NOTE — BH Assessment (Signed)
[facility] Assessment Progress Note  Per Buford Dresser, DO, this pt does not require psychiatric hospitalization at this time.  Pt is to be discharged from the [facility] Observation Unit with recommendation to continue treatment at the [facility].  This has been included in pt's discharge instructions, along with other supportive services for veterans.  Pt's nurse has been notified.  Jalene Mullet, [place] Triage Specialist [phone]

## 2019-01-30 NOTE — Discharge Instructions (Addendum)
CBG improved to 222 here with some IVF.  No findings of DKA on repeat labs. Continue insulin via sliding scale, carb/sugar modification.

## 2019-01-30 NOTE — ED Provider Notes (Signed)
[facility]-EMERGENCY DEPT Provider Note   CSN: [id number] Arrival date & time: 01/30/19  0013     History   Chief Complaint Chief Complaint  Patient presents with  . Suicidal  . Depression    HPI Maryan CharScott Orrick is a 58 y.o. male.     The history is provided by medical records and the patient.  Depression     58 year old male with history of diabetes, hypertension, PTSD, depression, presenting to the ED from behavioral health due to hyperglycemia.  Patient states he was at behavioral health last month for about 3 weeks and was released.  States while he was out of their facility he was not taking any of his medications or checking his blood sugars.  He returned to behavioral health yesterday due to recurrent suicidal thoughts.  He was restarted on his medications but glucose remained uncontrolled into the 400s.  He states he is not sure what his baseline glucose usually is.  He was given 20 units of insulin thus far at behavioral health without much improvement.  Denies any abdominal pain, nausea, vomiting, or urinary frequency.  He has been eating and drinking normally.  His only complaint here is feeling jittery after he was given Remeron for sleep.  He has never taken this medication before.  Past Medical History:  Diagnosis Date  . Arthritis   . Diabetes mellitus without complication (HCC)   . Hypertension   . PTSD (post-traumatic stress disorder)     Patient Active Problem List   Diagnosis Date Noted  . Severe recurrent major depression without psychotic features (HCC) 01/03/2019  . MDD (major depressive disorder), recurrent episode, severe (HCC) 01/03/2019  . Pancreatitis 08/03/2015  . Abnormal EKG 08/03/2015  . Essential hypertension 08/03/2015  . Diabetes mellitus type 2 in obese (HCC) 08/03/2015  . PTSD (post-traumatic stress disorder) 08/03/2015  . Abdominal aortic aneurysm (AAA) (HCC) 08/03/2015  . Acute pancreatitis 08/03/2015    Past  Surgical History:  Procedure Laterality Date  . CHOLECYSTECTOMY    . WRIST FRACTURE SURGERY Right         Home Medications    Prior to Admission medications   Medication Sig Start Date End Date Taking? Authorizing Provider  amLODipine (NORVASC) 10 MG tablet Take 1 tablet (10 mg total) by mouth daily. 01/09/19  Yes Aldean BakerSykes, Janet E, NP  benazepril (LOTENSIN) 20 MG tablet Take 1 tablet (20 mg total) by mouth daily. 01/09/19  Yes Aldean BakerSykes, Janet E, NP  insulin glargine (LANTUS) 100 UNIT/ML injection Inject 0.3 mLs (30 Units total) into the skin daily. 01/09/19   Aldean BakerSykes, Janet E, NP  metFORMIN (GLUCOPHAGE) 500 MG tablet Take 1 tablet (500 mg total) by mouth 2 (two) times daily with a meal. 01/08/19   Aldean BakerSykes, Janet E, NP  mirtazapine (REMERON) 30 MG tablet Take 1 tablet (30 mg total) by mouth at bedtime. 01/08/19   Aldean BakerSykes, Janet E, NP    Family History History reviewed. No pertinent family history.  Social History Social History   Tobacco Use  . Smoking status: Current Every Day Smoker    Packs/day: 2.00  . Smokeless tobacco: Never Used  Substance Use Topics  . Alcohol use: No  . Drug use: No     Allergies   Hctz [hydrochlorothiazide] and Seroquel [quetiapine fumarate]   Review of Systems Review of Systems  Endocrine:       Hyperglycemia  Psychiatric/Behavioral: Positive for depression.  All other systems reviewed and are negative.  Physical Exam Updated Vital Signs BP 127/80 (BP Location: Right Arm)   Pulse 90   Temp 97.9 F (36.6 C) (Oral)   Resp (!) 25   Ht 5\' 10"  (1.778 m)   Wt 108.4 kg   SpO2 97%   BMI 34.29 kg/m   Physical Exam Vitals signs and nursing note reviewed.  Constitutional:      Appearance: He is well-developed.  HENT:     Head: Normocephalic and atraumatic.  Eyes:     Conjunctiva/sclera: Conjunctivae normal.     Pupils: Pupils are equal, round, and reactive to light.  Neck:     Musculoskeletal: Normal range of motion.  Cardiovascular:      Rate and Rhythm: Normal rate and regular rhythm.     Heart sounds: Normal heart sounds.  Pulmonary:     Effort: Pulmonary effort is normal.     Breath sounds: Normal breath sounds.  Abdominal:     General: Bowel sounds are normal.     Palpations: Abdomen is soft.  Musculoskeletal: Normal range of motion.  Skin:    General: Skin is warm and dry.  Neurological:     Mental Status: He is alert and oriented to person, place, and time.      ED Treatments / Results  Labs (all labs ordered are listed, but only abnormal results are displayed) Labs Reviewed  CBC - Abnormal; Notable for the following components:      Result Value   WBC 13.6 (*)    Hemoglobin 17.6 (*)    All other components within normal limits  COMPREHENSIVE METABOLIC PANEL - Abnormal; Notable for the following components:   Sodium 124 (*)    Potassium 5.6 (*)    Chloride 94 (*)    CO2 14 (*)    Glucose, Bld 464 (*)    BUN 29 (*)    Calcium 10.6 (*)    AST 50 (*)    Total Bilirubin 2.2 (*)    Anion gap 16 (*)    All other components within normal limits  HEMOGLOBIN A1C - Abnormal; Notable for the following components:   Hgb A1c MFr Bld 12.7 (*)    All other components within normal limits  LIPID PANEL - Abnormal; Notable for the following components:   Cholesterol 656 (*)    Triglycerides 6,716 (*)    HDL 30 (*)    All other components within normal limits  VALPROIC ACID LEVEL - Abnormal; Notable for the following components:   Valproic Acid Lvl <10 (*)    All other components within normal limits  GLUCOSE, CAPILLARY - Abnormal; Notable for the following components:   Glucose-Capillary 475 (*)    All other components within normal limits  GLUCOSE, CAPILLARY - Abnormal; Notable for the following components:   Glucose-Capillary 482 (*)    All other components within normal limits  GLUCOSE, CAPILLARY - Abnormal; Notable for the following components:   Glucose-Capillary 450 (*)    All other components  within normal limits  GLUCOSE, CAPILLARY - Abnormal; Notable for the following components:   Glucose-Capillary 452 (*)    All other components within normal limits  CBC WITH DIFFERENTIAL/PLATELET - Abnormal; Notable for the following components:   WBC 11.8 (*)    Lymphs Abs 4.3 (*)    Abs Immature Granulocytes 0.09 (*)    All other components within normal limits  BASIC METABOLIC PANEL - Abnormal; Notable for the following components:   Sodium 128 (*)    Chloride  97 (*)    CO2 19 (*)    Glucose, Bld 296 (*)    BUN 24 (*)    All other components within normal limits  BLOOD GAS, VENOUS - Abnormal; Notable for the following components:   pCO2, Ven 36.7 (*)    pO2, Ven 50.7 (*)    All other components within normal limits  URINALYSIS, ROUTINE W REFLEX MICROSCOPIC - Abnormal; Notable for the following components:   Glucose, UA >=500 (*)    Bacteria, UA RARE (*)    All other components within normal limits  SARS CORONAVIRUS 2 (HOSPITAL ORDER, [facility])  ETHANOL  TSH  URINALYSIS, COMPLETE (UACMP) WITH MICROSCOPIC  RAPID URINE DRUG SCREEN, HOSP PERFORMED    EKG None  Radiology No results found.  Procedures Procedures (including critical care time)  Medications Ordered in ED Medications  amLODipine (NORVASC) tablet 10 mg (10 mg Oral Not Given 01/29/19 1855)  benazepril (LOTENSIN) tablet 20 mg (20 mg Oral Not Given 01/29/19 1855)  mirtazapine (REMERON) tablet 30 mg (30 mg Oral Given 01/29/19 2158)  insulin glargine (LANTUS) injection 30 Units (30 Units Subcutaneous Given 01/29/19 1856)  metFORMIN (GLUCOPHAGE) tablet 500 mg (500 mg Oral Given 01/29/19 1855)  insulin aspart (novoLOG) injection 0-9 Units (has no administration in time range)  nicotine (NICODERM CQ - dosed in mg/24 hours) patch 21 mg (21 mg Transdermal Not Given 01/29/19 2127)  insulin aspart (novoLOG) injection 10 Units (10 Units Subcutaneous Given 01/29/19 2055)  insulin aspart (novoLOG) injection  10 Units (10 Units Subcutaneous Given 01/29/19 2329)  sodium chloride 0.9 % bolus 1,000 mL (0 mLs Intravenous Stopped 01/30/19 0212)     Initial Impression / Assessment and Plan / ED Course  I have reviewed the triage vital signs and the nursing notes.  Pertinent labs & imaging results that were available during my care of the patient were reviewed by me and considered in my medical decision making (see chart for details).  58 year old male sent from behavioral health due to ongoing hyperglycemia with sugars of 450+.  He was given 20 units of insulin prior to arrival.  States he has not been taking his medications or checking his sugar in the past few weeks.  He is awake, alert, peripherally oriented here.  Vitals are stable.  He does not appear clinically dehydrated has not had any urinary frequency, nausea, or vomiting.  Labs from yesterday with a low bicarb of 14 and increased anion gap of 16.  Will give IV fluids and recheck basic labs.  Labs are improving, bicarb up to 19 and now with normal anion gap.  His VBG is reassuring and UA without any ketones.  This is not consistent with DKA.  CBG has improved to 222 on last re-check.  He is stable for discharge back to behavioral health.  Recommended to continue insulin via sliding scale, carb modified diet.  Final Clinical Impressions(s) / ED Diagnoses   Final diagnoses:  Hyperglycemia    ED Discharge Orders    None       Larene Pickett, PA-C 01/30/19 [place], Delice Bison, DO 01/30/19 [phone]

## 2019-01-30 NOTE — ED Notes (Signed)
Report given to [facility]. Pt to return to room 204. [facility] contacted for transport

## 2019-01-30 NOTE — Progress Notes (Signed)
Note : Heard noise in pt's room and found pt up against the shelf in room and bench. " I guess I was half asleep and fell on my arm. I do that a lot". No redness or swelling noted vital signs stable.B/p 103/77 HR 91 R-18. Pt oriented x 3. Shuvon Rankin N.P Notified

## 2019-01-30 NOTE — ED Notes (Signed)
Pt's CBG was 222.

## 2019-01-30 NOTE — Progress Notes (Signed)
Discharge instructions/medications/follow up appointments discussed with pt. Prescriptions given,  and patients belongings returned to pt.   Pt verbalizes understanding the importance of taking his medication and insulin correctly. Pt denies SI/HI/AVH. Pt will follow up with [facility]. Pt has his appointment. Pt states he's uncomfortable drawing up his insulin at home and he's working with [facility] at the [facility] who gives him tablets. Pt left via cab.

## 2019-01-30 NOTE — Discharge Instructions (Signed)
Contact Social Worker Jacob Brady at [phone] Ext [phone].  Inform Ms. Jacob Brady that you do not have transportation to appointments since the [facility] has stopped transporting and that you do not have a computer or mobile phone for telemedicine visit (equipment needed).  Ms. Jacob Brady should be able to assist you with either transportation to see you primary care and psychiatrist; or the equipment you need for the visits.    For your behavioral health needs, you are advised to continue treatment with the [place]:       [facility]      [place], [place] [address]      [phone]  For supportive services for veterans in the [place] area, contact the [place]:       [facility]      [address] [place],  [address]      [phone]      Hours of operation: 8:00 am - 7:00 pm, Monday - Friday  For crisis services for veterans, call the [employer], available 24 hours a day, 7 days a week:       [employer]      [phone]

## 2019-07-23 ENCOUNTER — Other Ambulatory Visit: Payer: Self-pay

## 2019-07-23 ENCOUNTER — Encounter (HOSPITAL_COMMUNITY): Payer: Self-pay

## 2019-07-23 ENCOUNTER — Emergency Department (HOSPITAL_COMMUNITY)
Admission: EM | Admit: 2019-07-23 | Discharge: 2019-07-24 | Discharge status: Against Medical Advice | Payer: No Typology Code available for payment source | Attending: Emergency Medicine | Admitting: Emergency Medicine

## 2019-07-23 DIAGNOSIS — Z5321 Procedure and treatment not carried out due to patient leaving prior to being seen by health care provider: Secondary | ICD-10-CM | POA: Insufficient documentation

## 2019-07-23 DIAGNOSIS — R1033 Periumbilical pain: Secondary | ICD-10-CM | POA: Diagnosis present

## 2019-07-23 LAB — URINALYSIS, ROUTINE W REFLEX MICROSCOPIC
Bacteria, UA: NONE SEEN
Bilirubin Urine: NEGATIVE
Glucose, UA: >=500 mg/dL — AB
Hgb urine dipstick: NEGATIVE
Ketones, ur: NEGATIVE mg/dL
Leukocytes,Ua: NEGATIVE
Nitrite: NEGATIVE
Protein, ur: NEGATIVE mg/dL
Specific Gravity, Urine: 1.03 (ref 1.005–1.030)
pH: 6 (ref 5.0–8.0)

## 2019-07-23 LAB — CBC
HCT: 46.3 % (ref 39.0–52.0)
Hemoglobin: 16.6 g/dL (ref 13.0–17.0)
MCH: 32.5 pg (ref 26.0–34.0)
MCHC: 35.9 g/dL (ref 30.0–36.0)
MCV: 90.8 fL (ref 80.0–100.0)
Platelets: 217 10*3/uL (ref 150–400)
RBC: 5.1 MIL/uL (ref 4.22–5.81)
RDW: 13.6 % (ref 11.5–15.5)
WBC: 8.2 10*3/uL (ref 4.0–10.5)
nRBC: 0 % (ref 0.0–0.2)

## 2019-07-23 LAB — LIPASE, BLOOD: Lipase: 34 U/L (ref 11–51)

## 2019-07-23 LAB — CBG MONITORING, ED: Glucose-Capillary: 389 mg/dL — ABNORMAL HIGH (ref 70–99)

## 2019-07-23 MED ORDER — SODIUM CHLORIDE 0.9% FLUSH: 3.0000 mL | Freq: Once | INTRAVENOUS | Status: DC

## 2019-07-23 NOTE — ED Triage Notes (Signed)
Pt reports abd pain and mid left sided back pain, hx of pancreatitis 3 years ago and states this pain feels similar. Pt denies n/v

## 2019-07-24 LAB — COMPREHENSIVE METABOLIC PANEL
ALT: 21 U/L (ref 0–44)
AST: 61 U/L — ABNORMAL HIGH (ref 15–41)
Albumin: 3.6 g/dL (ref 3.5–5.0)
Alkaline Phosphatase: 69 U/L (ref 38–126)
Anion gap: 11 (ref 5–15)
BUN: 10 mg/dL (ref 6–20)
CO2: 22 mmol/L (ref 22–32)
Calcium: 9.3 mg/dL (ref 8.9–10.3)
Chloride: 91 mmol/L — ABNORMAL LOW (ref 98–111)
Creatinine, Ser: 0.47 mg/dL — ABNORMAL LOW (ref 0.61–1.24)
GFR calc Af Amer: >60 mL/min (ref >60)
GFR calc non Af Amer: >60 mL/min (ref >60)
Glucose, Bld: 538 mg/dL (ref 70–99)
Potassium: 5.6 mmol/L — ABNORMAL HIGH (ref 3.5–5.1)
Sodium: 124 mmol/L — ABNORMAL LOW (ref 135–145)
Total Bilirubin: 2.4 mg/dL — ABNORMAL HIGH (ref 0.3–1.2)
Total Protein: 6.6 g/dL (ref 6.5–8.1)

## 2019-07-24 NOTE — ED Notes (Signed)
Pt stepped out to walk around

## 2019-07-24 NOTE — ED Notes (Signed)
Pt stated he was going home because the wait time is too long. This NT encouraged the pt to stay however pt insisted on leaving.

## 2019-08-04 ENCOUNTER — Encounter (HOSPITAL_COMMUNITY): Payer: Self-pay | Admitting: Emergency Medicine

## 2019-08-04 ENCOUNTER — Other Ambulatory Visit: Payer: Self-pay

## 2019-08-04 ENCOUNTER — Inpatient Hospital Stay (HOSPITAL_COMMUNITY)
Admission: EM | Admit: 2019-08-04 | Discharge: 2019-08-05 | DRG: 638 | Discharge status: Against Medical Advice | Payer: No Typology Code available for payment source | Attending: Internal Medicine | Admitting: Internal Medicine

## 2019-08-04 DIAGNOSIS — Z7984 Long term (current) use of oral hypoglycemic drugs: Secondary | ICD-10-CM

## 2019-08-04 DIAGNOSIS — N179 Acute kidney failure, unspecified: Secondary | ICD-10-CM

## 2019-08-04 DIAGNOSIS — I714 Abdominal aortic aneurysm, without rupture, unspecified: Secondary | ICD-10-CM | POA: Diagnosis present

## 2019-08-04 DIAGNOSIS — E781 Pure hyperglyceridemia: Secondary | ICD-10-CM | POA: Diagnosis present

## 2019-08-04 DIAGNOSIS — F319 Bipolar disorder, unspecified: Secondary | ICD-10-CM | POA: Diagnosis present

## 2019-08-04 DIAGNOSIS — E871 Hypo-osmolality and hyponatremia: Secondary | ICD-10-CM

## 2019-08-04 DIAGNOSIS — R634 Abnormal weight loss: Secondary | ICD-10-CM | POA: Diagnosis present

## 2019-08-04 DIAGNOSIS — F17210 Nicotine dependence, cigarettes, uncomplicated: Secondary | ICD-10-CM | POA: Diagnosis present

## 2019-08-04 DIAGNOSIS — E1110 Type 2 diabetes mellitus with ketoacidosis without coma: Secondary | ICD-10-CM | POA: Diagnosis not present

## 2019-08-04 DIAGNOSIS — Z8719 Personal history of other diseases of the digestive system: Secondary | ICD-10-CM

## 2019-08-04 DIAGNOSIS — E669 Obesity, unspecified: Secondary | ICD-10-CM | POA: Diagnosis present

## 2019-08-04 DIAGNOSIS — R1011 Right upper quadrant pain: Secondary | ICD-10-CM | POA: Diagnosis not present

## 2019-08-04 DIAGNOSIS — K861 Other chronic pancreatitis: Secondary | ICD-10-CM | POA: Diagnosis present

## 2019-08-04 DIAGNOSIS — R748 Abnormal levels of other serum enzymes: Secondary | ICD-10-CM | POA: Clinically undetermined

## 2019-08-04 DIAGNOSIS — Z9049 Acquired absence of other specified parts of digestive tract: Secondary | ICD-10-CM

## 2019-08-04 DIAGNOSIS — Z888 Allergy status to other drugs, medicaments and biological substances status: Secondary | ICD-10-CM

## 2019-08-04 DIAGNOSIS — I10 Essential (primary) hypertension: Secondary | ICD-10-CM | POA: Diagnosis present

## 2019-08-04 DIAGNOSIS — Z79899 Other long term (current) drug therapy: Secondary | ICD-10-CM

## 2019-08-04 DIAGNOSIS — G4700 Insomnia, unspecified: Secondary | ICD-10-CM | POA: Diagnosis present

## 2019-08-04 DIAGNOSIS — F4310 Post-traumatic stress disorder, unspecified: Secondary | ICD-10-CM | POA: Diagnosis present

## 2019-08-04 DIAGNOSIS — Z5329 Procedure and treatment not carried out because of patient's decision for other reasons: Secondary | ICD-10-CM | POA: Diagnosis not present

## 2019-08-04 DIAGNOSIS — I452 Bifascicular block: Secondary | ICD-10-CM | POA: Diagnosis present

## 2019-08-04 DIAGNOSIS — Z20822 Contact with and (suspected) exposure to covid-19: Secondary | ICD-10-CM | POA: Diagnosis present

## 2019-08-04 HISTORY — DX: Acute pancreatitis without necrosis or infection, unspecified: K85.90

## 2019-08-04 LAB — URINALYSIS, ROUTINE W REFLEX MICROSCOPIC
Bacteria, UA: NONE SEEN
Bilirubin Urine: NEGATIVE
Glucose, UA: >=500 mg/dL — AB
Ketones, ur: 20 mg/dL — AB
Leukocytes,Ua: NEGATIVE
Nitrite: NEGATIVE
Protein, ur: NEGATIVE mg/dL
Specific Gravity, Urine: 1.028 (ref 1.005–1.030)
pH: 5 (ref 5.0–8.0)

## 2019-08-04 LAB — LIPASE, BLOOD: Lipase: 26 U/L (ref 11–51)

## 2019-08-04 MED ORDER — MORPHINE SULFATE (PF) 4 MG/ML IV SOLN
4.0000 mg | Freq: Once | INTRAVENOUS | Status: AC
  Administered 2019-08-04: 4 mg via INTRAVENOUS
  Filled 2019-08-04: qty 1

## 2019-08-04 MED ORDER — SODIUM CHLORIDE 0.9 % IV BOLUS
500.0000 mL | Freq: Once | INTRAVENOUS | Status: AC
  Administered 2019-08-04: 500 mL via INTRAVENOUS

## 2019-08-04 MED ORDER — SODIUM CHLORIDE 0.9% FLUSH
3.0000 mL | Freq: Once | INTRAVENOUS | Status: AC
  Administered 2019-08-04: 3 mL via INTRAVENOUS

## 2019-08-04 NOTE — ED Triage Notes (Signed)
Patient reports LUQ pain for 1 month , denies emesis or diarrhea , patient added mid/low back pain this week , history of pancreatitis , no fever or chills .

## 2019-08-04 NOTE — ED Provider Notes (Signed)
[facility] EMERGENCY DEPARTMENT Provider Note   CSN: [id number] Arrival date & time: 08/04/19  2119     History Chief Complaint  Patient presents with  . Abdominal Pain    Jacob Brady is a 59 y.o. male.  Patient with a history of DM, HTN, bipolar, anxiety presents with left sided abdominal pain and corresponding back pain that started one month ago and has been steadily progressive. He reports appetite loss and a 20 pound weight loss since symptoms started. No nausea, vomiting, diarrhea, constipation or fever. He reports urinating frequently but denies hematuria or groin pain. No chest pain, SOB, cough. The pain is exacerbated with movement, especially when standing.   The history is provided by the patient. No language interpreter was used.  Abdominal Pain Associated symptoms: no constipation, no diarrhea, no fever, no hematuria, no nausea and no vomiting        Past Medical History:  Diagnosis Date  . Arthritis   . Diabetes mellitus without complication ([place])   . Hypertension   . Pancreatitis   . PTSD (post-traumatic stress disorder)     Patient Active Problem List   Diagnosis Date Noted  . MDD (major depressive disorder), recurrent episode, moderate ([place])   . Severe recurrent major depression without psychotic features ([place]) 01/03/2019  . MDD (major depressive disorder), recurrent episode, severe ([place]) 01/03/2019  . Pancreatitis 08/03/2015  . Abnormal EKG 08/03/2015  . Essential hypertension 08/03/2015  . Diabetes mellitus type 2 in obese ([place]) 08/03/2015  . PTSD (post-traumatic stress disorder) 08/03/2015  . Abdominal aortic aneurysm (AAA) ([place]) 08/03/2015  . Acute pancreatitis 08/03/2015    Past Surgical History:  Procedure Laterality Date  . CHOLECYSTECTOMY    . WRIST FRACTURE SURGERY Right        No family history on file.  Social History   Tobacco Use  . Smoking status: Current Every Day Smoker    Packs/day: 2.00  . Smokeless  tobacco: Never Used  Substance Use Topics  . Alcohol use: No  . Drug use: No    Home Medications Prior to Admission medications   Medication Sig Start Date End Date Taking? Authorizing Provider  amLODipine (NORVASC) 10 MG tablet Take 1 tablet (10 mg total) by mouth daily. 01/09/19   Connye Burkitt, NP  benazepril (LOTENSIN) 20 MG tablet Take 1 tablet (20 mg total) by mouth daily. 01/09/19   Connye Burkitt, NP  gabapentin (NEURONTIN) 100 MG capsule Take 1 capsule (100 mg total) by mouth 2 (two) times daily AND 3 capsules (300 mg total) at bedtime. 01/30/19   Rankin, Shuvon B, NP  insulin glargine (LANTUS) 100 UNIT/ML injection Inject 0.3 mLs (30 Units total) into the skin daily. Patient not taking: Reported on 01/30/2019 01/09/19   Connye Burkitt, NP  metFORMIN (GLUCOPHAGE) 500 MG tablet Take 1 tablet (500 mg total) by mouth 2 (two) times daily with a meal. 01/30/19   Rankin, Shuvon B, NP  mirtazapine (REMERON) 30 MG tablet Take 1 tablet (30 mg total) by mouth at bedtime. Patient not taking: Reported on 01/30/2019 01/08/19   Connye Burkitt, NP    Allergies    Hctz [hydrochlorothiazide] and Seroquel [quetiapine fumarate]  Review of Systems   Review of Systems  Constitutional: Positive for appetite change and unexpected weight change. Negative for fever.  HENT: Negative.   Respiratory: Negative.   Cardiovascular: Negative.   Gastrointestinal: Positive for abdominal pain. Negative for constipation, diarrhea, nausea and vomiting.  Genitourinary: Positive  for frequency. Negative for hematuria.  Musculoskeletal: Positive for back pain.  Skin: Negative for color change and rash.  Neurological: Positive for weakness (Mild, generalized).    Physical Exam Updated Vital Signs BP (!) 155/104 (BP Location: Right Arm)   Pulse (!) 108   Temp 98.5 F (36.9 C) (Oral)   Resp 18   SpO2 98%   Physical Exam Vitals and nursing note reviewed.  Constitutional:      Appearance: He is well-developed. He is  obese. He is not toxic-appearing.  HENT:     Head: Normocephalic.  Cardiovascular:     Rate and Rhythm: Regular rhythm. Tachycardia present.  Pulmonary:     Effort: Pulmonary effort is normal.     Breath sounds: Normal breath sounds.  Abdominal:     General: Bowel sounds are decreased.     Palpations: Abdomen is soft.     Tenderness: There is abdominal tenderness in the left upper quadrant. There is no guarding or rebound.  Musculoskeletal:        General: Normal range of motion.     Cervical back: Normal range of motion and neck supple.       Back:     Comments: Tenderness to palpation. No midline tenderness.   Skin:    General: Skin is warm and dry.     Coloration: Skin is not mottled.     Findings: No erythema or rash.  Neurological:     Mental Status: He is alert and oriented to person, place, and time.     ED Results / Procedures / Treatments   Labs (all labs ordered are listed, but only abnormal results are displayed) Labs Reviewed  URINALYSIS, ROUTINE W REFLEX MICROSCOPIC - Abnormal; Notable for the following components:      Result Value   Color, Urine STRAW (*)    Glucose, UA >=500 (*)    Hgb urine dipstick SMALL (*)    Ketones, ur 20 (*)    All other components within normal limits  LIPASE, BLOOD  COMPREHENSIVE METABOLIC PANEL  CBC  MAGNESIUM    EKG None  Radiology No results found.  Procedures Procedures (including critical care time) CRITICAL CARE Performed by: Arnoldo Hooker   Total critical care time: 60 minutes  Critical care time was exclusive of separately billable procedures and treating other patients.  Critical care was necessary to treat or prevent imminent or life-threatening deterioration.  Critical care was time spent personally by me on the following activities: development of treatment plan with patient and/or surrogate as well as nursing, discussions with consultants, evaluation of patient's response to treatment, examination  of patient, obtaining history from patient or surrogate, ordering and performing treatments and interventions, ordering and review of laboratory studies, ordering and review of radiographic studies, pulse oximetry and re-evaluation of patient's condition.  Medications Ordered in ED Medications  sodium chloride flush (NS) 0.9 % injection 3 mL (has no administration in time range)  morphine 4 MG/ML injection 4 mg (has no administration in time range)  sodium chloride 0.9 % bolus 500 mL (has no administration in time range)    ED Course  I have reviewed the triage vital signs and the nursing notes.  Pertinent labs & imaging results that were available during my care of the patient were reviewed by me and considered in my medical decision making (see chart for details).    MDM Rules/Calculators/A&P  Patient to ED c/o weight loss, left abdominal and back pain x 1 month.   On chart review the patient presented on December 28th but left prior to being seen. At that time his sodium was 124, K+ 5.6, Glu over 500, total bili 2.4. Labs pending currently. Will establish IV, provide pain medication.   Morphine provided with good pain relief. Per lab tech, the patient's blood specimen with have to go to [facility] due to that it is extremely lipemic. Result will be delayed.   The patient has required multiple doses of pain medication to control pain. He continues to be awake, alert, VSS. CBC resulted with Hgb 20.8, no leukocytosis.   Will attempt I-stat chem 8 to be able to proceed with CT abd/pel.   CT negative for acute findings.   CMET with multiple derangements:  evidence of DKA (VBG ordered/pending), severe hyponatremia, AKI. Discussed plan of admission with the patient who is agreeable. IV NS fluids running. COVID pending. Hospitalist paged.    Final Clinical Impression(s) / ED Diagnoses Final diagnoses:  None   1. Severe hyponatremia 2. AKI 3. DKA 4 abdominal pain  Rx  / DC Orders ED Discharge Orders    None       Elpidio Anis, PA-C 08/05/19 4268    Mesner, Barbara Cower, MD 08/05/19 [phone]

## 2019-08-05 ENCOUNTER — Encounter (HOSPITAL_COMMUNITY): Payer: Self-pay | Admitting: Radiology

## 2019-08-05 ENCOUNTER — Emergency Department (HOSPITAL_COMMUNITY): Payer: No Typology Code available for payment source

## 2019-08-05 DIAGNOSIS — E1110 Type 2 diabetes mellitus with ketoacidosis without coma: Secondary | ICD-10-CM | POA: Diagnosis present

## 2019-08-05 DIAGNOSIS — F17210 Nicotine dependence, cigarettes, uncomplicated: Secondary | ICD-10-CM | POA: Diagnosis present

## 2019-08-05 DIAGNOSIS — F319 Bipolar disorder, unspecified: Secondary | ICD-10-CM | POA: Diagnosis present

## 2019-08-05 DIAGNOSIS — Z9049 Acquired absence of other specified parts of digestive tract: Secondary | ICD-10-CM | POA: Diagnosis not present

## 2019-08-05 DIAGNOSIS — I10 Essential (primary) hypertension: Secondary | ICD-10-CM

## 2019-08-05 DIAGNOSIS — R634 Abnormal weight loss: Secondary | ICD-10-CM | POA: Diagnosis present

## 2019-08-05 DIAGNOSIS — Z20822 Contact with and (suspected) exposure to covid-19: Secondary | ICD-10-CM | POA: Diagnosis present

## 2019-08-05 DIAGNOSIS — N179 Acute kidney failure, unspecified: Secondary | ICD-10-CM | POA: Diagnosis present

## 2019-08-05 DIAGNOSIS — R748 Abnormal levels of other serum enzymes: Secondary | ICD-10-CM

## 2019-08-05 DIAGNOSIS — G4700 Insomnia, unspecified: Secondary | ICD-10-CM | POA: Diagnosis present

## 2019-08-05 DIAGNOSIS — I452 Bifascicular block: Secondary | ICD-10-CM | POA: Diagnosis present

## 2019-08-05 DIAGNOSIS — K861 Other chronic pancreatitis: Secondary | ICD-10-CM | POA: Diagnosis present

## 2019-08-05 DIAGNOSIS — E871 Hypo-osmolality and hyponatremia: Secondary | ICD-10-CM | POA: Diagnosis present

## 2019-08-05 DIAGNOSIS — I714 Abdominal aortic aneurysm, without rupture: Secondary | ICD-10-CM | POA: Diagnosis present

## 2019-08-05 DIAGNOSIS — Z7984 Long term (current) use of oral hypoglycemic drugs: Secondary | ICD-10-CM | POA: Diagnosis not present

## 2019-08-05 DIAGNOSIS — E781 Pure hyperglyceridemia: Secondary | ICD-10-CM | POA: Diagnosis present

## 2019-08-05 DIAGNOSIS — E669 Obesity, unspecified: Secondary | ICD-10-CM | POA: Diagnosis present

## 2019-08-05 DIAGNOSIS — Z79899 Other long term (current) drug therapy: Secondary | ICD-10-CM | POA: Diagnosis not present

## 2019-08-05 DIAGNOSIS — Z8719 Personal history of other diseases of the digestive system: Secondary | ICD-10-CM

## 2019-08-05 DIAGNOSIS — Z5329 Procedure and treatment not carried out because of patient's decision for other reasons: Secondary | ICD-10-CM | POA: Diagnosis not present

## 2019-08-05 DIAGNOSIS — R1011 Right upper quadrant pain: Secondary | ICD-10-CM | POA: Diagnosis present

## 2019-08-05 DIAGNOSIS — F4310 Post-traumatic stress disorder, unspecified: Secondary | ICD-10-CM | POA: Diagnosis present

## 2019-08-05 DIAGNOSIS — Z888 Allergy status to other drugs, medicaments and biological substances status: Secondary | ICD-10-CM | POA: Diagnosis not present

## 2019-08-05 LAB — POCT I-STAT EG7
Bicarbonate: 25 mmol/L (ref 20.0–28.0)
Calcium, Ion: 1.19 mmol/L (ref 1.15–1.40)
HCT: 45 % (ref 39.0–52.0)
Hemoglobin: 15.3 g/dL (ref 13.0–17.0)
O2 Saturation: 81 %
Patient temperature: 37
Potassium: 5 mmol/L (ref 3.5–5.1)
Sodium: 131 mmol/L — ABNORMAL LOW (ref 135–145)
TCO2: 26 mmol/L (ref 22–32)
pCO2, Ven: 41.3 mmHg — ABNORMAL LOW (ref 44.0–60.0)
pH, Ven: 7.391 (ref 7.250–7.430)
pO2, Ven: 45 mmHg (ref 32.0–45.0)

## 2019-08-05 LAB — SARS CORONAVIRUS 2 (TAT 6-24 HRS): SARS Coronavirus 2: NEGATIVE

## 2019-08-05 LAB — HIV ANTIBODY (ROUTINE TESTING W REFLEX): HIV Screen 4th Generation wRfx: NONREACTIVE

## 2019-08-05 LAB — URINALYSIS, ROUTINE W REFLEX MICROSCOPIC
Bacteria, UA: NONE SEEN
Bilirubin Urine: NEGATIVE
Glucose, UA: >=500 mg/dL — AB
Hgb urine dipstick: NEGATIVE
Ketones, ur: 20 mg/dL — AB
Leukocytes,Ua: NEGATIVE
Nitrite: NEGATIVE
Protein, ur: NEGATIVE mg/dL
Specific Gravity, Urine: >1.046 — ABNORMAL HIGH (ref 1.005–1.030)
pH: 5 (ref 5.0–8.0)

## 2019-08-05 LAB — COMPREHENSIVE METABOLIC PANEL
ALT: UNDETERMINED U/L (ref 0–44)
AST: UNDETERMINED U/L (ref 15–41)
Albumin: 4.1 g/dL (ref 3.5–5.0)
Alkaline Phosphatase: UNDETERMINED U/L (ref 38–126)
Anion gap: 16 — ABNORMAL HIGH (ref 5–15)
BUN: 22 mg/dL — ABNORMAL HIGH (ref 6–20)
CO2: 15 mmol/L — ABNORMAL LOW (ref 22–32)
Calcium: 9.4 mg/dL (ref 8.9–10.3)
Chloride: 82 mmol/L — ABNORMAL LOW (ref 98–111)
Creatinine, Ser: 1.9 mg/dL — ABNORMAL HIGH (ref 0.61–1.24)
GFR calc Af Amer: 45 mL/min — ABNORMAL LOW (ref >60)
GFR calc non Af Amer: 39 mL/min — ABNORMAL LOW (ref >60)
Glucose, Bld: 434 mg/dL — ABNORMAL HIGH (ref 70–99)
Potassium: UNDETERMINED mmol/L (ref 3.5–5.1)
Sodium: 113 mmol/L — CL (ref 135–145)
Total Bilirubin: 5.8 mg/dL — ABNORMAL HIGH (ref 0.3–1.2)
Total Protein: <3 g/dL — ABNORMAL LOW (ref 6.5–8.1)

## 2019-08-05 LAB — CBC WITH DIFFERENTIAL/PLATELET
Abs Immature Granulocytes: 0.11 10*3/uL — ABNORMAL HIGH (ref 0.00–0.07)
Basophils Absolute: 0.1 10*3/uL (ref 0.0–0.1)
Basophils Relative: 1 %
Eosinophils Absolute: 0.2 10*3/uL (ref 0.0–0.5)
Eosinophils Relative: 2 %
HCT: 42.1 % (ref 39.0–52.0)
Hemoglobin: 15.8 g/dL (ref 13.0–17.0)
Immature Granulocytes: 1 %
Lymphocytes Relative: 41 %
Lymphs Abs: 3.7 10*3/uL (ref 0.7–4.0)
MCH: 35.1 pg — ABNORMAL HIGH (ref 26.0–34.0)
MCHC: 37.5 g/dL — ABNORMAL HIGH (ref 30.0–36.0)
MCV: 93.6 fL (ref 80.0–100.0)
Monocytes Absolute: 0.8 10*3/uL (ref 0.1–1.0)
Monocytes Relative: 8 %
Neutro Abs: 4.3 10*3/uL (ref 1.7–7.7)
Neutrophils Relative %: 47 %
Platelets: 209 10*3/uL (ref 150–400)
RBC: 4.5 MIL/uL (ref 4.22–5.81)
RDW: 14.6 % (ref 11.5–15.5)
WBC: 9.2 10*3/uL (ref 4.0–10.5)
nRBC: 0 % (ref 0.0–0.2)

## 2019-08-05 LAB — CBC
HCT: 57.3 % — ABNORMAL HIGH (ref 39.0–52.0)
Hemoglobin: 20.8 g/dL — ABNORMAL HIGH (ref 13.0–17.0)
MCH: 32.7 pg (ref 26.0–34.0)
MCHC: 36.3 g/dL — ABNORMAL HIGH (ref 30.0–36.0)
MCV: 90 fL (ref 80.0–100.0)
Platelets: 251 10*3/uL (ref 150–400)
RBC: 6.37 MIL/uL — ABNORMAL HIGH (ref 4.22–5.81)
RDW: 14.4 % (ref 11.5–15.5)
WBC: 6 10*3/uL (ref 4.0–10.5)
nRBC: 0 % (ref 0.0–0.2)

## 2019-08-05 LAB — CBG MONITORING, ED
Glucose-Capillary: 321 mg/dL — ABNORMAL HIGH (ref 70–99)
Glucose-Capillary: 236 mg/dL — ABNORMAL HIGH (ref 70–99)
Glucose-Capillary: 159 mg/dL — ABNORMAL HIGH (ref 70–99)
Glucose-Capillary: 219 mg/dL — ABNORMAL HIGH (ref 70–99)

## 2019-08-05 LAB — I-STAT CHEM 8, ED
BUN: 21 mg/dL — ABNORMAL HIGH (ref 6–20)
Calcium, Ion: 1.1 mmol/L — ABNORMAL LOW (ref 1.15–1.40)
Chloride: 106 mmol/L (ref 98–111)
Creatinine, Ser: 0.5 mg/dL — ABNORMAL LOW (ref 0.61–1.24)
Glucose, Bld: 360 mg/dL — ABNORMAL HIGH (ref 70–99)
HCT: 45 % (ref 39.0–52.0)
Hemoglobin: 15.3 g/dL (ref 13.0–17.0)
Potassium: 5.2 mmol/L — ABNORMAL HIGH (ref 3.5–5.1)
Sodium: 129 mmol/L — ABNORMAL LOW (ref 135–145)
TCO2: 21 mmol/L — ABNORMAL LOW (ref 22–32)

## 2019-08-05 LAB — TSH: TSH: 1.974 u[IU]/mL (ref 0.350–4.500)

## 2019-08-05 LAB — MAGNESIUM: Magnesium: 3.9 mg/dL — ABNORMAL HIGH (ref 1.7–2.4)

## 2019-08-05 LAB — LIPID PANEL
Cholesterol: 662 mg/dL — ABNORMAL HIGH (ref 0–200)
LDL Cholesterol: UNDETERMINED mg/dL (ref 0–99)
Triglycerides: >5000 mg/dL — ABNORMAL HIGH (ref <150)
VLDL: UNDETERMINED mg/dL (ref 0–40)

## 2019-08-05 LAB — LDL CHOLESTEROL, DIRECT: Direct LDL: UNDETERMINED mg/dL (ref 0–99)

## 2019-08-05 MED ORDER — IOHEXOL 300 MG/ML  SOLN
125.0000 mL | Freq: Once | INTRAMUSCULAR | Status: AC | PRN
  Administered 2019-08-05: 125 mL via INTRAVENOUS

## 2019-08-05 MED ORDER — BENAZEPRIL HCL 20 MG PO TABS: 20.0000 mg | ORAL_TABLET | Freq: Every day | ORAL | Status: DC

## 2019-08-05 MED ORDER — DEXTROSE-NACL 5-0.45 % IV SOLN: INTRAVENOUS | Status: DC

## 2019-08-05 MED ORDER — ACETAMINOPHEN 650 MG RE SUPP: 650.0000 mg | Freq: Four times a day (QID) | RECTAL | Status: DC | PRN

## 2019-08-05 MED ORDER — SODIUM CHLORIDE 0.9 % IV BOLUS
1000.0000 mL | Freq: Once | INTRAVENOUS | Status: AC
  Administered 2019-08-05: 1000 mL via INTRAVENOUS

## 2019-08-05 MED ORDER — HYDRALAZINE HCL 10 MG PO TABS: 10.0000 mg | ORAL_TABLET | Freq: Two times a day (BID) | ORAL | Status: DC

## 2019-08-05 MED ORDER — ENOXAPARIN SODIUM 40 MG/0.4ML ~~LOC~~ SOLN: 40.0000 mg | SUBCUTANEOUS | Status: DC

## 2019-08-05 MED ORDER — FENOFIBRATE 160 MG PO TABS: 160.0000 mg | ORAL_TABLET | Freq: Every day | ORAL | Status: DC

## 2019-08-05 MED ORDER — AMLODIPINE BESYLATE 5 MG PO TABS
10.0000 mg | ORAL_TABLET | Freq: Every day | ORAL | Status: DC
  Administered 2019-08-05: 10 mg via ORAL
  Filled 2019-08-05: qty 2

## 2019-08-05 MED ORDER — DEXTROSE 5 % IV SOLN: INTRAVENOUS | Status: DC

## 2019-08-05 MED ORDER — ACETAMINOPHEN 325 MG PO TABS
650.0000 mg | ORAL_TABLET | Freq: Four times a day (QID) | ORAL | Status: DC | PRN
  Administered 2019-08-05: 650 mg via ORAL
  Filled 2019-08-05: qty 2

## 2019-08-05 MED ORDER — GABAPENTIN 100 MG PO CAPS: 200.0000 mg | ORAL_CAPSULE | Freq: Every day | ORAL | Status: DC

## 2019-08-05 MED ORDER — SODIUM CHLORIDE 0.9 % IV SOLN: Freq: Once | INTRAVENOUS | Status: AC

## 2019-08-05 MED ORDER — SODIUM CHLORIDE 0.9% FLUSH: 3.0000 mL | Freq: Two times a day (BID) | INTRAVENOUS | Status: DC

## 2019-08-05 MED ORDER — VENLAFAXINE HCL ER 150 MG PO CP24
150.0000 mg | ORAL_CAPSULE | Freq: Every day | ORAL | Status: DC
  Filled 2019-08-05: qty 1

## 2019-08-05 MED ORDER — MORPHINE SULFATE (PF) 4 MG/ML IV SOLN
4.0000 mg | Freq: Once | INTRAVENOUS | Status: AC
  Administered 2019-08-05: 4 mg via INTRAVENOUS
  Filled 2019-08-05: qty 1

## 2019-08-05 MED ORDER — GABAPENTIN 600 MG PO TABS: 600.0000 mg | ORAL_TABLET | Freq: Every day | ORAL | Status: DC

## 2019-08-05 MED ORDER — SODIUM CHLORIDE 0.9 % IV SOLN: INTRAVENOUS | Status: DC

## 2019-08-05 MED ORDER — ONDANSETRON HCL 4 MG/2ML IJ SOLN: 4.0000 mg | Freq: Four times a day (QID) | INTRAMUSCULAR | Status: DC | PRN

## 2019-08-05 MED ORDER — NICOTINE 21 MG/24HR TD PT24
21.0000 mg | MEDICATED_PATCH | Freq: Once | TRANSDERMAL | Status: DC
  Administered 2019-08-05: 21 mg via TRANSDERMAL
  Filled 2019-08-05: qty 1

## 2019-08-05 MED ORDER — DEXTROSE 50 % IV SOLN: 0.0000 mL | INTRAVENOUS | Status: DC | PRN

## 2019-08-05 MED ORDER — LISINOPRIL 20 MG PO TABS
40.0000 mg | ORAL_TABLET | Freq: Every day | ORAL | Status: DC
  Administered 2019-08-05: 40 mg via ORAL
  Filled 2019-08-05: qty 2

## 2019-08-05 MED ORDER — ONDANSETRON HCL 4 MG PO TABS: 4.0000 mg | ORAL_TABLET | Freq: Four times a day (QID) | ORAL | Status: DC | PRN

## 2019-08-05 MED ORDER — ALBUTEROL SULFATE (2.5 MG/3ML) 0.083% IN NEBU: 2.5000 mg | INHALATION_SOLUTION | Freq: Four times a day (QID) | RESPIRATORY_TRACT | Status: DC | PRN

## 2019-08-05 MED ORDER — BUSPIRONE HCL 10 MG PO TABS
10.0000 mg | ORAL_TABLET | Freq: Every day | ORAL | Status: DC
  Administered 2019-08-05: 10 mg via ORAL
  Filled 2019-08-05: qty 1

## 2019-08-05 MED ORDER — DIVALPROEX SODIUM ER 500 MG PO TB24: 1000.0000 mg | ORAL_TABLET | Freq: Every day | ORAL | Status: DC

## 2019-08-05 MED ORDER — INSULIN REGULAR(HUMAN) IN NACL 100-0.9 UT/100ML-% IV SOLN
INTRAVENOUS | Status: DC
  Administered 2019-08-05: 13 [IU]/h via INTRAVENOUS
  Filled 2019-08-05: qty 100

## 2019-08-05 NOTE — ED Notes (Signed)
Breakfast tray ordered 

## 2019-08-05 NOTE — Progress Notes (Signed)
Inpatient Diabetes Program Recommendations  AACE/ADA: New Consensus Statement on Inpatient Glycemic Control (2015)  Target Ranges:  Prepandial:   less than 140 mg/dL      Peak postprandial:   less than 180 mg/dL (1-2 hours)      Critically ill patients:  140 - 180 mg/dL   Lab Results  Component Value Date   GLUCAP 159 (H) 08/05/2019   HGBA1C 12.7 (H) 01/29/2019    Review of Glycemic Control Results for JHAIR, WITHERINGTON (MRN [id number]) as of 08/05/2019 09:34  Ref. Range 07/23/2019 22:27 08/05/2019 06:05 08/05/2019 07:26 08/05/2019 08:40  Glucose-Capillary Latest Ref Range: 70 - 99 mg/dL 838 (H) 184 (H) 037 (H) 159 (H)   Diabetes history: DM 2 Outpatient Diabetes medications:  70/30 25 units bid (per med. Reconciliation) Current orders for Inpatient glycemic control:  IV insulin Inpatient Diabetes Program Recommendations:    Note triglycerides>5000. IV insulin infusing.  Referral received.  Will follow up with patient on 1/11 regarding home insulin use.  Will likely need IV insulin until triglygerides improved.   Thanks,  Beryl Meager, RN, BC-ADM Inpatient Diabetes Coordinator Pager [phone] (8a-5p)

## 2019-08-05 NOTE — ED Notes (Signed)
Lab unable to run CMP due to lipemia and hemolysis. Dr Katrinka Blazing made aware. Pt also requesting to go home

## 2019-08-05 NOTE — ED Notes (Signed)
CBG - 219 

## 2019-08-05 NOTE — H&P (Addendum)
History and Physical    Jacob Brady YTK:[id number] DOB: 06/17/1961 DOA: 08/04/2019  Referring MD/NP/PA: Jennette Kettle, MD PCP: [facility]  Patient coming from: Home  Chief Complaint: left sided abdominal pain  I have personally briefly reviewed patient's old medical records in [place]   HPI: Jacob Brady is a 59 y.o. male with medical history significant of hypertension, diabetes, AAA, pancreatitis, bipolar disorder/PTSD, tobacco abuse, and arthritis.  He presents with complaints of a 1 month history of constant left upper quadrant abdominal pain.  Pain seems to be worse when he initially wakes up in the morning and it causes him to have to sit down.  Due to the pain he has been eating less, but states that he has been drinking lots of water.  Associated symptoms include fatigue/malaise, weight loss of 25 pounds in the last month, back pain that he reports is separate from his abdominal pain, dry mouth, urinary frequency, and insomnia.  Denies having any significant cough, shortness of breath, chest pain, fever, chills, nausea, vomiting, or alcohol use.  He reports that when he checks his blood sugars they normally are anywhere from 390-490 at home.  He reports being on Novolin 70/30 insulin 2.5 or 25 units twice daily.  It is unclear if he understands how to draw up the correct amount of insulin to give himself.  He normally follows at the [facility] hospital, but reports having some difficulties in getting transportation.  Patient does admit to smoking pack cigarettes per day on average.   ED Course: Upon admission into the emergency department patient was noted to be afebrile, pulse 78-108, respiration 15-21, blood pressures 106/92-169/99, and O2 saturations maintained on room air.  Initial labs obtained on on 1/9 revealed WBC 6, hemoglobin 20.8, sodium 113, chloride 82, CO2 15 BUN 22, creatinine 1.9, anion gap 16, lipase 26, and some liver function studies unable to be calculated.   Urinalysis revealed > 500 glucose, 20 ketones, specific gravity> 1.046, and no signs of infection.  CT scan of the abdomen and pelvis revealed no acute cause for patient's abdominal pain but did note enlargement of the infrarenal abdominal aortic aneurysm to 4.4 cm.  Patient was given 1.5 L bolus of normal saline IV fluids, morphine, and started on a insulin drip with normal saline IV fluids at 75 mL/h.  It appears patient's sodium levels increased to 131 this a.m.   Review of Systems  Constitutional: Positive for malaise/fatigue and weight loss. Negative for chills and fever.  HENT: Negative for ear pain and sore throat.   Eyes: Negative for photophobia and pain.  Respiratory: Negative for cough and shortness of breath.   Cardiovascular: Negative for chest pain and leg swelling.  Gastrointestinal: Positive for abdominal pain. Negative for constipation, diarrhea, nausea and vomiting.  Genitourinary: Positive for frequency. Negative for dysuria.  Musculoskeletal: Positive for back pain. Negative for falls.  Skin: Negative for rash.  Neurological: Positive for weakness. Negative for loss of consciousness.  Endo/Heme/Allergies: Positive for polydipsia.  Psychiatric/Behavioral: Positive for substance abuse (tobacco). The patient has insomnia.     Past Medical History:  Diagnosis Date   Arthritis    Diabetes mellitus without complication ([place])    Hypertension    Pancreatitis    PTSD (post-traumatic stress disorder)     Past Surgical History:  Procedure Laterality Date   CHOLECYSTECTOMY     WRIST FRACTURE SURGERY Right      reports that he has been smoking. He has been smoking  about 2.00 packs per day. He has never used smokeless tobacco. He reports that he does not drink alcohol or use drugs.  Allergies  Allergen Reactions   Hctz [Hydrochlorothiazide] Other (See Comments)    Unknown rxn, "they just told me not to take it anymore"   Seroquel [Quetiapine Fumarate] Other (See  Comments)    Elevates liver enzymes     No family history on file.   Prior to Admission medications   Medication Sig Start Date End Date Taking? Authorizing Provider  amLODipine (NORVASC) 10 MG tablet Take 1 tablet (10 mg total) by mouth daily. 01/09/19  Yes Aldean Baker, NP  benazepril (LOTENSIN) 20 MG tablet Take 1 tablet (20 mg total) by mouth daily. 01/09/19  Yes Aldean Baker, NP  gabapentin (NEURONTIN) 100 MG capsule Take 200 mg by mouth at bedtime.   Yes [provider]  metFORMIN (GLUCOPHAGE) 500 MG tablet Take 1 tablet (500 mg total) by mouth 2 (two) times daily with a meal. 01/30/19  Yes Rankin, Shuvon B, NP  gabapentin (NEURONTIN) 100 MG capsule Take 1 capsule (100 mg total) by mouth 2 (two) times daily AND 3 capsules (300 mg total) at bedtime. 01/30/19   Rankin, Shuvon B, NP  insulin glargine (LANTUS) 100 UNIT/ML injection Inject 0.3 mLs (30 Units total) into the skin daily. Patient not taking: Reported on 01/30/2019 01/09/19   Aldean Baker, NP  mirtazapine (REMERON) 30 MG tablet Take 1 tablet (30 mg total) by mouth at bedtime. Patient not taking: Reported on 01/30/2019 01/08/19   Aldean Baker, NP    Physical Exam:  Constitutional: Middle-age male who appears to be in no acute distress at this time Vitals:   08/05/19 0345 08/05/19 0401 08/05/19 0530 08/05/19 0545  BP: 109/77 (!) 145/96 (!) 142/88 (!) 169/99  Pulse: 80  74 76  Resp: 19 19 15 18   Temp:      TempSrc:      SpO2: 94%  94% 96%   Eyes: PERRL, lids and conjunctivae normal ENMT: Mucous membranes are dry. Posterior pharynx clear of any exudate or lesions. Neck: normal, supple, no masses, no thyromegaly Respiratory: clear to auscultation bilaterally, no wheezing, no crackles. Normal respiratory effort. No accessory muscle use.  Cardiovascular: Regular rate and rhythm, no murmurs / rubs / gallops. No extremity edema. 2+ pedal pulses. No carotid bruits.  Abdomen: No significant left upper quadrant tenderness,  no masses palpated. No hepatosplenomegaly. Bowel sounds positive.  Musculoskeletal: no clubbing / cyanosis. No joint deformity upper and lower extremities. Good ROM, no contractures. Normal muscle tone.  Skin: no rashes, lesions, ulcers. No induration Neurologic: CN 2-12 grossly intact. Sensation intact, DTR normal. Strength 5/5 in all 4.  Psychiatric: Normal judgment and insight. Alert and oriented x 3. Normal mood.     Labs on Admission: I have personally reviewed following labs and imaging studies  CBC: Recent Labs  Lab 08/05/19 0017 08/05/19 0323 08/05/19 0548  WBC 6.0  --   --   HGB 20.8* 15.3 15.3  HCT 57.3* 45.0 45.0  MCV 90.0  --   --   PLT 251  --   --    Basic Metabolic Panel: Recent Labs  Lab 08/04/19 2330 08/05/19 0323 08/05/19 0548  NA 113* 129* 131*  K UNABLE TO RESULT DUE TO HEMOLYSIS 5.2* 5.0  CL 82* 106  --   CO2 15*  --   --   GLUCOSE 434* 360*  --   BUN 22*  21*  --   CREATININE 1.90* 0.50*  --   CALCIUM 9.4  --   --   MG 3.9*  --   --    GFR: Estimated Creatinine Clearance: 130.1 mL/min (A) (by C-G formula based on SCr of 0.5 mg/dL (L)). Liver Function Tests: Recent Labs  Lab 08/04/19 2330  AST UNABLE TO RESULT DUE TO HEMOLYSIS  ALT UNABLE TO RESULT DUE TO HEMOLYSIS  ALKPHOS UNABLE TO RESULT DUE TO HEMOLYSIS  BILITOT 5.8*  PROT <3.0*  ALBUMIN 4.1   Recent Labs  Lab 08/04/19 2143  LIPASE 26   No results for input(s): AMMONIA in the last 168 hours. Coagulation Profile: No results for input(s): INR, PROTIME in the last 168 hours. Cardiac Enzymes: No results for input(s): CKTOTAL, CKMB, CKMBINDEX, TROPONINI in the last 168 hours. BNP (last 3 results) No results for input(s): PROBNP in the last 8760 hours. HbA1C: No results for input(s): HGBA1C in the last 72 hours. CBG: Recent Labs  Lab 08/05/19 0605  GLUCAP 321*   Lipid Profile: No results for input(s): CHOL, HDL, LDLCALC, TRIG, CHOLHDL, LDLDIRECT in the last 72 hours. Thyroid  Function Tests: No results for input(s): TSH, T4TOTAL, FREET4, T3FREE, THYROIDAB in the last 72 hours. Anemia Panel: No results for input(s): VITAMINB12, FOLATE, FERRITIN, TIBC, IRON, RETICCTPCT in the last 72 hours. Urine analysis:    Component Value Date/Time   COLORURINE YELLOW 08/05/2019 0505   APPEARANCEUR CLEAR 08/05/2019 0505   LABSPEC >1.046 (H) 08/05/2019 0505   PHURINE 5.0 08/05/2019 0505   GLUCOSEU >=500 (A) 08/05/2019 0505   HGBUR NEGATIVE 08/05/2019 0505   BILIRUBINUR NEGATIVE 08/05/2019 0505   KETONESUR 20 (A) 08/05/2019 0505   PROTEINUR NEGATIVE 08/05/2019 0505   NITRITE NEGATIVE 08/05/2019 0505   LEUKOCYTESUR NEGATIVE 08/05/2019 0505   Sepsis Labs: No results found for this or any previous visit (from the past 240 hour(s)).   Radiological Exams on Admission: CT ABDOMEN PELVIS W CONTRAST  Result Date: 08/05/2019 CLINICAL DATA:  Abdominal pain and back pain EXAM: CT ABDOMEN AND PELVIS WITH CONTRAST TECHNIQUE: Multidetector CT imaging of the abdomen and pelvis was performed using the standard protocol following bolus administration of intravenous contrast. CONTRAST:  OMNIPAQUE IOHEXOL 300 MG/ML  SOLN COMPARISON:  08/03/2015 FINDINGS: LOWER CHEST: No basilar pleural or apical pericardial effusion. HEPATOBILIARY: Diffuse hypoattenuation of the liver relative to the spleen suggests hepatic steatosis. No focal liver lesion or biliary dilatation. Status post cholecystectomy. PANCREAS: Normal pancreas. No ductal dilatation or peripancreatic fluid collection. SPLEEN: Normal. ADRENALS/URINARY TRACT: The adrenal glands are normal. No hydronephrosis, nephroureterolithiasis or solid renal mass. The urinary bladder is normal for degree of distention STOMACH/BOWEL: There is no hiatal hernia. Normal duodenal course and caliber. No small bowel dilatation or inflammation. No focal colonic abnormality. Normal appendix. VASCULAR/LYMPHATIC: There is aortic atherosclerosis with an infrarenal  abdominal aortic aneurysm that measures 4.4 cm, previously 3.8 cm. No abdominal or pelvic lymphadenopathy. REPRODUCTIVE: Normal prostate size with symmetric seminal vesicles. MUSCULOSKELETAL. No bony spinal canal stenosis or focal osseous abnormality. OTHER: None. IMPRESSION: 1. No acute abnormality of the abdomen or pelvis. 2. Infrarenal abdominal aortic aneurysm measuring 4.4 cm, previously 3.8 cm. Recommend followup by ultrasound in 1 year. This recommendation follows ACR consensus guidelines: White Paper of the ACR Incidental Findings Committee II on Vascular Findings. J Am Coll Radiol 2013; 10:789-794. Aortic aneurysm NOS (ICD10-I71.9) Aortic atherosclerosis (ICD10-I70.0). 3. Hepatic steatosis. Electronically Signed   By: Deatra Robinson M.D.   On: 08/05/2019 04:37  EKG: Independently reviewed.  Sinus rhythm at 96 bpm with PVC and QTC within normal limits.  Assessment/Plan DKA, type II: Acute.  On admission patient presented with blood glucose elevated up to 434, CO2 15, and anion gap 16.  Urinalysis was positive for ketones and glucose.  Records show that patient's last hemoglobin A1c back in 01/2019 was 12.7 which appears to be uncontrolled.  Patient reports blood sugars anywhere from 300s to 400s.  Home medication regimen appears to be Novolin 70/30 insulin 25 units twice daily, but it is unclear if patient is trying up insulin correctly. -Admit to a progressive bed -Check hemoglobin A1c -BMP every 4 hours x2 -IV fluids D5W -Continue insulin drip with CBG checks every hour and will transition back to subcu insulin when able -Diabetic education  Hyponatremia: Patient's initial sodium was noted to be 113 on admission at 11 PM yesterday, but when corrected for hyperglycemia noted to be 118.  Repeat checks sodium 129->131(uncorrected for hyperglycemia still present) in less than 12 hours.  Urine sodium and osmolarity not initially performed. -Goal correction of sodium <10 mmol/L per 24-hour -BMPs  every 4 hours x2 -Change IV fluids to D5W  Left upper quadrant abdominal pain: Unclear the cause of his symptoms as CT scan did not show any acute abnormalities.  Initially had question the possibility of pancreatitis.  Symptoms improved after pain medication -Continue to monitor  Acute kidney injury: Resolved.  On admission patient's initial creatinine was noted to be 1.9 with BUN 22, but after initial IV fluids repeat creatinine 0.5. -Continue IV fluids  Essential hypertension: Blood pressures relatively stable.  Patient's home medications include amlodipine 10 mg daily, lisinopril 40 mg daily, hydralazine 10 mg twice daily. -Continue home regimen  Weight loss: Patient reports 25 pound weight loss in the last month.  Suspect symptoms likely secondary to diabetes and/or chronic pancreatitis. -Check TSH  Hypertriglyceridemia: Patient appears to have had elevated triglycerides up to 6716 in July 2020.  On medications include pravastatin 80 mg nightly. -Recheck lipid panel -Continue pravastatin -Added on fenofibrate  History of pancreatitis: Lipase noted to be within normal limits on admission and CT scan of the abdomen and pelvis did not reveal any signs of inflammation around the pancreas as to the cause of patient's left-sided abdominal pain.  Review of records shows patient previously noted to have pancreatitis back in January 2017 that was thought possibly related to HCTZ at that time.  Triglycerides at that time more 343.  Bipolar disorder/PTSD: Patient's home medications include venlafaxine 150 mg daily, buspirone 10 mg daily, Depakote 1000 mg nightly. -Added on Depakote level -Continue home medication regimen  Abnormal liver enzymes: On admission AST, ALT, and alkaline phosphatase unable to be calculated due to blood hemolyzed. -Recheck CMP stat  Infrarenal abdominal aortic aneurysm: CT scan of the abdomen pelvis revealed enlargement of the inferior renal abdominal aortic aneurysm  from 3.8 cm in 2017 to 4.4 cm. -Recommending follow-up ultrasound in 1 year  Insomnia: Home medications include mirtazapine 30 mg nightly. -Continue mirtazapine  Tobacco abuse: Patient reports smoking 1 pack cigarettes per day on average. -Nicotine patch offered -Counseled on need of cessation of tobacco use  *Note med reconciliation that was initially performed appears to be incorrect*  DVT prophylaxis: Lovenox Code Status: Full Family Communication: No family present at bedside Disposition Plan: Possible discharge home in 2-3 days after correction of sodium levels Consults called: Diabetic educator Admission status: inpatient   Clydie Braun MD [facility] Pager  [phone]   If 7PM-7AM, please contact night-coverage [email or web address] Password [email or web address]  08/05/2019, 6:58 AM

## 2019-08-05 NOTE — ED Notes (Signed)
CBG 236. 

## 2019-08-05 NOTE — Discharge Summary (Addendum)
Physician Discharge Summary  Jacob Brady [id number] DOB: Nov 27, 1960 DOA: 08/04/2019  PCP[facility]  Admit date: 08/04/2019 Discharge date: 08/05/2019  Patient was advised on need of staying in the hospital to receive further treatment by myself and ER nurse for his symptoms.  However, patient left proximal 2 hours after being seen and admitted to our service.  Labs did note that his triglycerides were significantly elevated > 5000.  Patient is a high risk for pancreatitis not treated.  See H&P for full details of presenting symptoms.  Clydie Braun, MD  [facility] 08/05/2019, 2:05 PM Pager   If 7PM-7AM, please contact night-coverage [email or web address] Password [email or web address]

## 2019-08-05 NOTE — ED Notes (Signed)
Patient transported to CT 

## 2019-08-05 NOTE — ED Notes (Signed)
Pt will be leaving AMA, this RN explained to the pt the importance of staying and Dr Katrinka Blazing was made aware and spoke with the pt as well.

## 2019-08-05 NOTE — ED Notes (Signed)
CBG=159  

## 2019-08-06 ENCOUNTER — Emergency Department (HOSPITAL_COMMUNITY)
Admission: EM | Admit: 2019-08-06 | Discharge: 2019-08-06 | Disposition: A | Discharge status: Home / Self Care | Payer: No Typology Code available for payment source | Attending: Emergency Medicine | Admitting: Emergency Medicine

## 2019-08-06 ENCOUNTER — Encounter (HOSPITAL_COMMUNITY): Payer: Self-pay | Admitting: Internal Medicine

## 2019-08-06 ENCOUNTER — Other Ambulatory Visit: Payer: Self-pay

## 2019-08-06 DIAGNOSIS — R109 Unspecified abdominal pain: Secondary | ICD-10-CM | POA: Insufficient documentation

## 2019-08-06 DIAGNOSIS — E669 Obesity, unspecified: Secondary | ICD-10-CM | POA: Diagnosis present

## 2019-08-06 DIAGNOSIS — K858 Other acute pancreatitis without necrosis or infection: Secondary | ICD-10-CM | POA: Diagnosis not present

## 2019-08-06 DIAGNOSIS — E875 Hyperkalemia: Secondary | ICD-10-CM | POA: Insufficient documentation

## 2019-08-06 DIAGNOSIS — R42 Dizziness and giddiness: Secondary | ICD-10-CM | POA: Diagnosis present

## 2019-08-06 DIAGNOSIS — E1165 Type 2 diabetes mellitus with hyperglycemia: Secondary | ICD-10-CM | POA: Insufficient documentation

## 2019-08-06 DIAGNOSIS — F17210 Nicotine dependence, cigarettes, uncomplicated: Secondary | ICD-10-CM | POA: Insufficient documentation

## 2019-08-06 DIAGNOSIS — E781 Pure hyperglyceridemia: Secondary | ICD-10-CM | POA: Diagnosis not present

## 2019-08-06 DIAGNOSIS — I10 Essential (primary) hypertension: Secondary | ICD-10-CM | POA: Diagnosis not present

## 2019-08-06 DIAGNOSIS — I714 Abdominal aortic aneurysm, without rupture, unspecified: Secondary | ICD-10-CM | POA: Diagnosis present

## 2019-08-06 DIAGNOSIS — R739 Hyperglycemia, unspecified: Secondary | ICD-10-CM

## 2019-08-06 DIAGNOSIS — Z794 Long term (current) use of insulin: Secondary | ICD-10-CM | POA: Insufficient documentation

## 2019-08-06 DIAGNOSIS — M549 Dorsalgia, unspecified: Secondary | ICD-10-CM | POA: Diagnosis present

## 2019-08-06 DIAGNOSIS — Z79899 Other long term (current) drug therapy: Secondary | ICD-10-CM | POA: Diagnosis not present

## 2019-08-06 DIAGNOSIS — E1169 Type 2 diabetes mellitus with other specified complication: Secondary | ICD-10-CM | POA: Diagnosis present

## 2019-08-06 DIAGNOSIS — E860 Dehydration: Secondary | ICD-10-CM | POA: Diagnosis present

## 2019-08-06 LAB — BASIC METABOLIC PANEL WITH GFR
Anion gap: 15 (ref 5–15)
BUN: 14 mg/dL (ref 6–20)
CO2: 19 mmol/L — ABNORMAL LOW (ref 22–32)
Calcium: 9.8 mg/dL (ref 8.9–10.3)
Chloride: 90 mmol/L — ABNORMAL LOW (ref 98–111)
Creatinine, Ser: 0.44 mg/dL — ABNORMAL LOW (ref 0.61–1.24)
GFR calc Af Amer: >60 mL/min
GFR calc non Af Amer: >60 mL/min
Glucose, Bld: 435 mg/dL — ABNORMAL HIGH (ref 70–99)
Potassium: 5.5 mmol/L — ABNORMAL HIGH (ref 3.5–5.1)
Sodium: 124 mmol/L — ABNORMAL LOW (ref 135–145)

## 2019-08-06 LAB — CBC
HCT: 44.7 % (ref 39.0–52.0)
Hemoglobin: 16.1 g/dL (ref 13.0–17.0)
MCH: 32.7 pg (ref 26.0–34.0)
MCHC: 36 g/dL (ref 30.0–36.0)
MCV: 90.7 fL (ref 80.0–100.0)
Platelets: 182 10*3/uL (ref 150–400)
RBC: 4.93 MIL/uL (ref 4.22–5.81)
RDW: 13.7 % (ref 11.5–15.5)
WBC: 12.4 10*3/uL — ABNORMAL HIGH (ref 4.0–10.5)
nRBC: 0 % (ref 0.0–0.2)

## 2019-08-06 LAB — URINALYSIS, ROUTINE W REFLEX MICROSCOPIC
Bacteria, UA: NONE SEEN
Bilirubin Urine: NEGATIVE
Glucose, UA: >=500 mg/dL — AB
Hgb urine dipstick: NEGATIVE
Ketones, ur: 80 mg/dL — AB
Leukocytes,Ua: NEGATIVE
Nitrite: NEGATIVE
Protein, ur: NEGATIVE mg/dL
Specific Gravity, Urine: 1.031 — ABNORMAL HIGH (ref 1.005–1.030)
pH: 5 (ref 5.0–8.0)

## 2019-08-06 LAB — HEPATIC FUNCTION PANEL
ALT: 25 U/L (ref 0–44)
AST: 45 U/L — ABNORMAL HIGH (ref 15–41)
Albumin: 4 g/dL (ref 3.5–5.0)
Alkaline Phosphatase: 58 U/L (ref 38–126)
Bilirubin, Direct: 1.2 mg/dL — ABNORMAL HIGH (ref 0.0–0.2)
Indirect Bilirubin: 2.3 mg/dL — ABNORMAL HIGH (ref 0.3–0.9)
Total Bilirubin: 3.5 mg/dL — ABNORMAL HIGH (ref 0.3–1.2)
Total Protein: 7.4 g/dL (ref 6.5–8.1)

## 2019-08-06 LAB — HEMOGLOBIN A1C
Hgb A1c MFr Bld: 15.5 % — ABNORMAL HIGH (ref 4.8–5.6)
Mean Plasma Glucose: 398 mg/dL

## 2019-08-06 LAB — LIPASE, BLOOD: Lipase: 76 U/L — ABNORMAL HIGH (ref 11–51)

## 2019-08-06 LAB — CBG MONITORING, ED: Glucose-Capillary: 289 mg/dL — ABNORMAL HIGH (ref 70–99)

## 2019-08-06 MED ORDER — INSULIN ASPART 100 UNIT/ML ~~LOC~~ SOLN
8.0000 [IU] | Freq: Once | SUBCUTANEOUS | Status: AC
  Administered 2019-08-06: 8 [IU] via SUBCUTANEOUS

## 2019-08-06 MED ORDER — MORPHINE SULFATE (PF) 4 MG/ML IV SOLN
4.0000 mg | Freq: Once | INTRAVENOUS | Status: AC
  Administered 2019-08-06: 4 mg via INTRAVENOUS
  Filled 2019-08-06: qty 1

## 2019-08-06 MED ORDER — HYDROMORPHONE HCL 1 MG/ML IJ SOLN
0.5000 mg | Freq: Once | INTRAMUSCULAR | Status: AC
  Administered 2019-08-06: 0.5 mg via INTRAVENOUS
  Filled 2019-08-06: qty 1

## 2019-08-06 MED ORDER — ACETAMINOPHEN 500 MG PO TABS
1000.0000 mg | ORAL_TABLET | Freq: Once | ORAL | Status: AC
  Administered 2019-08-06: 1000 mg via ORAL
  Filled 2019-08-06: qty 2

## 2019-08-06 MED ORDER — SODIUM CHLORIDE 0.9 % IV BOLUS
1000.0000 mL | Freq: Once | INTRAVENOUS | Status: AC
  Administered 2019-08-06: 1000 mL via INTRAVENOUS

## 2019-08-06 MED ORDER — SODIUM CHLORIDE 0.9 % IV BOLUS
2000.0000 mL | Freq: Once | INTRAVENOUS | Status: AC
  Administered 2019-08-06: 2000 mL via INTRAVENOUS

## 2019-08-06 NOTE — ED Notes (Signed)
Pt provided urinal, states he will try to provide sample after RN leaves

## 2019-08-06 NOTE — Discharge Instructions (Addendum)
Continue taking your medications as prescribed. Make sure you stay well-hydrated with water. Follow-up with your primary care doctor within the next 3 days for recheck of your symptoms.  You should discuss your elevated triglyceride levels and elevated blood sugar.  This is likely contributing to your symptoms. Return to the emergency room with any new, worsening, concerning symptoms.

## 2019-08-06 NOTE — ED Notes (Signed)
Patient verbalizes understanding of discharge instructions. Opportunity for questioning and answers were provided. Armband removed by staff, pt discharged from ED to home in cab.

## 2019-08-06 NOTE — ED Notes (Signed)
wasted 0.79ml with brittany RN.

## 2019-08-06 NOTE — ED Triage Notes (Signed)
Pt seen here day before yesterday, admitted and then left AMA.

## 2019-08-06 NOTE — ED Notes (Signed)
0.34ml (0.5mg ) dilaudid wasted in sharps with Michele Mcalpine, RN. Not able to waste at time of admin.

## 2019-08-06 NOTE — ED Notes (Signed)
Pt stated that his back was hurting. Informed Sophia - [place] and [place] - Charity fundraiser.

## 2019-08-06 NOTE — Consult Note (Signed)
ER Consult Note   Gergory Biello TGG:[id number] DOB: [birth year] DOA: 08/06/2019  [facility]P: [facility] Consultants:  None Patient coming from:  Home - lives alone; NOK: No one  Chief Complaint: dizziness  HPI: Jacob Brady is a 59 y.o. male with medical history significant of hypertension, diabetes, AAA, pancreatitis, bipolar disorder/PTSD, tobacco abuse, and arthritis presenting with dizziness and abdominal pain.  "It's been a rough 30 days."  He reports that he has bene in such pain in his back and front and it's hard to sleep or do anything and so he has bene losing weight.  Today, he laid down and stood up and felt very dizzy when he stood up and fell down.  He finally got up and sat in his recliner.  When he got up from there, he couldn't stand again.  He previously left the hospital because he thought they would get better but they didn't  He has bene taking decreased PO, increased thirst.  He has intermittent LUQ pain and back pain, h/o pancreatitis twice - but currently resolved after pain medication.  He hasn't ppicked up anything strenuous or heavy, not sure what is causing the pain.  He was last hospitalized from 1/9-10 but left AMA about 2 hours after admission was completed.   He was to be admitted for DKA with hyponatremia, AKI, and LUQ pain.  ED Course:  Admitted 1/9-10 for mild DKA, increased triglycerides, ?pancreatitis.  Left AMA yesterday and felt worse.  ?consult and d/c.  Given 1 round of insulin, not started on a drip.  Review of Systems: As per HPI; otherwise review of systems reviewed and negative.   Ambulatory Status:  Ambulates without assistance  Past Medical History:  Diagnosis Date  . Arthritis   . Diabetes mellitus without complication (HCC)   . Hypertension   . Pancreatitis   . PTSD (post-traumatic stress disorder)     Past Surgical History:  Procedure Laterality Date  . CHOLECYSTECTOMY    . WRIST FRACTURE SURGERY Right     Social History    Socioeconomic History  . Marital status: Divorced    Spouse name: Not on file  . Number of children: Not on file  . Years of education: Not on file  . Highest education level: Not on file  Occupational History  . Occupation: disability  Tobacco Use  . Smoking status: Current Every Day Smoker    Packs/day: 1.50    Years: 35.00    Pack years: 52.50  . Smokeless tobacco: Never Used  Substance and Sexual Activity  . Alcohol use: No  . Drug use: No  . Sexual activity: Not Currently  Other Topics Concern  . Not on file  Social History Narrative  . Not on file   Social Determinants of Health   Financial Resource Strain:   . Difficulty of Paying Living Expenses: Not on file  Food Insecurity:   . Worried About Programme researcher, broadcasting/film/video in the Last Year: Not on file  . Ran Out of Food in the Last Year: Not on file  Transportation Needs:   . Lack of Transportation (Medical): Not on file  . Lack of Transportation (Non-Medical): Not on file  Physical Activity:   . Days of Exercise per Week: Not on file  . Minutes of Exercise per Session: Not on file  Stress:   . Feeling of Stress : Not on file  Social Connections:   . Frequency of Communication with Friends and Family: Not on file  .  Frequency of Social Gatherings with Friends and Family: Not on file  . Attends Religious Services: Not on file  . Active Member of Clubs or Organizations: Not on file  . Attends Banker Meetings: Not on file  . Marital Status: Not on file  Intimate Partner Violence:   . Fear of Current or Ex-Partner: Not on file  . Emotionally Abused: Not on file  . Physically Abused: Not on file  . Sexually Abused: Not on file    Allergies  Allergen Reactions  . Hctz [Hydrochlorothiazide] Other (See Comments)    Unknown rxn, "they just told me not to take it anymore"  . Seroquel [Quetiapine Fumarate] Other (See Comments)    Elevates liver enzymes     Family History  Problem Relation Age of  Onset  . Aneurysm Mother   . Brain cancer Father 32    Prior to Admission medications   Medication Sig Start Date End Date Taking? Authorizing Provider  amLODipine (NORVASC) 10 MG tablet Take 1 tablet (10 mg total) by mouth daily. 01/09/19  Yes Aldean Baker, NP  busPIRone (BUSPAR) 10 MG tablet Take 10 mg by mouth daily.   Yes [provider]  divalproex (DEPAKOTE) 500 MG DR tablet Take 1,000 mg by mouth at bedtime.   Yes [provider]  gabapentin (NEURONTIN) 300 MG capsule Take 600 mg by mouth at bedtime.    Yes [provider]  hydrALAZINE (APRESOLINE) 10 MG tablet Take 10 mg by mouth 2 (two) times daily.   Yes [provider]  ibuprofen (ADVIL) 200 MG tablet Take 400 mg by mouth every 6 (six) hours as needed for moderate pain.   Yes [provider]  insulin NPH-regular Human (70-30) 100 UNIT/ML injection Inject 25 Units into the skin 2 (two) times daily with a meal.   Yes [provider]  lisinopril (ZESTRIL) 40 MG tablet Take 40 mg by mouth daily.   Yes [provider]  mirtazapine (REMERON) 30 MG tablet Take 1 tablet (30 mg total) by mouth at bedtime. 01/08/19  Yes Aldean Baker, NP  pravastatin (PRAVACHOL) 80 MG tablet Take 80 mg by mouth daily.   Yes [provider]  venlafaxine (EFFEXOR) 75 MG tablet Take 150 mg by mouth daily.   Yes [provider]  benazepril (LOTENSIN) 20 MG tablet Take 1 tablet (20 mg total) by mouth daily. Patient not taking: Reported on 08/05/2019 01/09/19   Aldean Baker, NP  gabapentin (NEURONTIN) 100 MG capsule Take 1 capsule (100 mg total) by mouth 2 (two) times daily AND 3 capsules (300 mg total) at bedtime. Patient not taking: Reported on 08/05/2019 01/30/19   Rankin, Shuvon B, NP  insulin glargine (LANTUS) 100 UNIT/ML injection Inject 0.3 mLs (30 Units total) into the skin daily. Patient not taking: Reported on 01/30/2019 01/09/19   Aldean Baker, NP  metFORMIN (GLUCOPHAGE) 500  MG tablet Take 1 tablet (500 mg total) by mouth 2 (two) times daily with a meal. Patient not taking: Reported on 08/05/2019 01/30/19   Assunta Found B, NP    Physical Exam: Vitals:   08/06/19 1130 08/06/19 1145 08/06/19 1230 08/06/19 1245  BP: 119/73 118/67 114/80 107/66  Pulse: 78 72 81 78  Resp: 20 18 16  (!) 25  Temp:      TempSrc:      SpO2:   97% 96%     . General:  Appears calm and comfortable and is NAD . Eyes:  PERRL, EOMI, normal lids, iris . ENT:  grossly normal hearing, lips & tongue, mmm . Neck:  no LAD, masses or thyromegaly . Cardiovascular:  RRR, no m/r/g. No LE edema.  Marland Kitchen Respiratory:   CTA bilaterally with no wheezes/rales/rhonchi.  Normal respiratory effort. . Abdomen:  soft, NT, ND, NABS . Skin:  no rash or induration seen on limited exam . Musculoskeletal:  grossly normal tone BUE/BLE, good ROM, no bony abnormality . Psychiatric:  grossly normal mood and affect, speech fluent and appropriate, AOx3 . Neurologic:  CN 2-12 grossly intact, moves all extremities in coordinated fashion, sensation intact    Radiological Exams on Admission: CT ABDOMEN PELVIS W CONTRAST  Result Date: 08/05/2019 CLINICAL DATA:  Abdominal pain and back pain EXAM: CT ABDOMEN AND PELVIS WITH CONTRAST TECHNIQUE: Multidetector CT imaging of the abdomen and pelvis was performed using the standard protocol following bolus administration of intravenous contrast. CONTRAST:  OMNIPAQUE IOHEXOL 300 MG/ML  SOLN COMPARISON:  08/03/2015 FINDINGS: LOWER CHEST: No basilar pleural or apical pericardial effusion. HEPATOBILIARY: Diffuse hypoattenuation of the liver relative to the spleen suggests hepatic steatosis. No focal liver lesion or biliary dilatation. Status post cholecystectomy. PANCREAS: Normal pancreas. No ductal dilatation or peripancreatic fluid collection. SPLEEN: Normal. ADRENALS/URINARY TRACT: The adrenal glands are normal. No hydronephrosis, nephroureterolithiasis or solid renal mass. The  urinary bladder is normal for degree of distention STOMACH/BOWEL: There is no hiatal hernia. Normal duodenal course and caliber. No small bowel dilatation or inflammation. No focal colonic abnormality. Normal appendix. VASCULAR/LYMPHATIC: There is aortic atherosclerosis with an infrarenal abdominal aortic aneurysm that measures 4.4 cm, previously 3.8 cm. No abdominal or pelvic lymphadenopathy. REPRODUCTIVE: Normal prostate size with symmetric seminal vesicles. MUSCULOSKELETAL. No bony spinal canal stenosis or focal osseous abnormality. OTHER: None. IMPRESSION: 1. No acute abnormality of the abdomen or pelvis. 2. Infrarenal abdominal aortic aneurysm measuring 4.4 cm, previously 3.8 cm. Recommend followup by ultrasound in 1 year. This recommendation follows ACR consensus guidelines: White Paper of the ACR Incidental Findings Committee II on Vascular Findings. J Am Coll Radiol 2013; 10:789-794. Aortic aneurysm NOS (ICD10-I71.9) Aortic atherosclerosis (ICD10-I70.0). 3. Hepatic steatosis. Electronically Signed   By: Deatra Robinson M.D.   On: 08/05/2019 04:37    EKG: Independently reviewed.  NSR with rate 96; incomplete RBBB, LAFB; nonspecific ST changes with no evidence of acute ischemia   Labs on Admission: I have personally reviewed the available labs and imaging studies at the time of the admission.  Pertinent labs:   Na++ 124 K+ 5.5 CO2 19 Glucose 435 -> 289 Lipase 76 AST 45/ALT 25/Bili 3.5 WBC 12.4 UA: >500 glucose, 80 ketones   Assessment/Plan Principal Problem:   Dehydration Active Problems:   Essential hypertension   Diabetes mellitus type 2 in obese (HCC)   Abdominal aortic aneurysm (AAA) (HCC)   Back pain   Dehydration -Patent with recent ER visit and plan for admission for DKA; he left AMA yesterday AM -Returned to the ER today with dizziness -He is not in DKA at this time and his labs are overall better -He is complaining of back > abdominal pain and does not appear to have  pancreatitis -Based on overall reassuring appearance with better labs, I offered the patient the option of outpatient f/u vs. Overnight observation -He has received 2+ L IVF and feels better and would like to be discharged  Back pain -Continue Neurontin -F/u with PCP  DM -Suboptimal control, but not in DKA at this time -Continue home meds - NPH,  Glucophage -Outpatient f/u  HTN -Continue Norvasc, Lotensin, Hydralazine  HLD -Continue Pravachol  Bipolar depression -Continue Buspar, Depakote, Remeron, Effexor  AAA -AAA 4.4 cm (increased from 3.8 cm) - needs f/u US in 1 year  Note: This patient has been tested and is negative for the novel coronavirus COVID-19.  Based on evaluation and discussion with the patient at this time, ER d/c appears to be appropriate.    Karmen Bongo MD [facility]   How to contact the [facility] Attending or Consulting provider [place] or covering provider during after hours [place], for this patient?  1. Check the care team in [facility] and look for a) attending/consulting [facility] provider listed and b) the [facility] team listed 2. Log into [email or web address] and use [place]'s universal password to access. If you do not have the password, please contact the hospital operator. 3. Locate the [facility] provider you are looking for under [facility] and page to a number that you can be directly reached. 4. If you still have difficulty reaching the provider, please page the [facility] (Director on Call) for the Hospitalists listed on amion for assistance.   08/06/2019, 2:19 PM

## 2019-08-06 NOTE — ED Provider Notes (Addendum)
[facility] Provider Note   CSN: [id number] Arrival date & time: 08/06/19  0405     History Chief Complaint  Patient presents with  . Dizziness    Jacob Brady is a 59 y.o. male presenting for evaluation of worsening dizziness and abdominal pain.  Patient states his symptoms began 30 days ago.  He reports persistent left-sided abdominal pain.  He has been having intermittent dizziness.  Dizziness is present when he goes from sitting to standing, feels like a lightheadedness or presyncopal feeling.  Patient states he was seen in the ED 2 days ago, was admitted but he decided to leave because he was feeling better and he thought he could follow-up with the [facility] [place].  [facility].  Patient states he has had a 20 to 30 pound weight loss in the past month.  He denies headache, vision changes, cough, chest pain, shortness of breath, urinary symptoms, abnormal bowel movements.  Patient reports a history of diabetes.  There has been difficulty controlling his blood sugars recently, his PCP change his medicines around, but he is still in the 400s normally.  He reports a history of a cholecystectomy and previous pancreatitis, no other abdominal surgeries.  Additional history obtained from chart review.  Reviewed lab work from 2 days ago when patient was admitted.  He was very hyponatremic and a mild DKA.  His triglycerides were over 5000, ?pancreatitis despite a normal CT and lipase.  HPI     Past Medical History:  Diagnosis Date  . Arthritis   . Diabetes mellitus without complication ([place])   . Hypertension   . Pancreatitis   . PTSD (post-traumatic stress disorder)     Patient Active Problem List   Diagnosis Date Noted  . DKA, type 2 ([place]) 08/05/2019  . Weight loss 08/05/2019  . Hyponatremia 08/05/2019  . AKI (acute kidney injury) ([place]) 08/05/2019  . History of pancreatitis  08/05/2019  . Abnormal liver enzymes 08/05/2019  . MDD (major depressive disorder), recurrent episode, moderate ([place])   . Severe recurrent major depression without psychotic features ([place]) 01/03/2019  . MDD (major depressive disorder), recurrent episode, severe ([place]) 01/03/2019  . Pancreatitis 08/03/2015  . Abnormal EKG 08/03/2015  . Essential hypertension 08/03/2015  . Diabetes mellitus type 2 in obese ([place]) 08/03/2015  . PTSD (post-traumatic stress disorder) 08/03/2015  . Abdominal aortic aneurysm (AAA) ([place]) 08/03/2015  . Acute pancreatitis 08/03/2015    Past Surgical History:  Procedure Laterality Date  . CHOLECYSTECTOMY    . WRIST FRACTURE SURGERY Right        No family history on file.  Social History   Tobacco Use  . Smoking status: Current Every Day Smoker    Packs/day: 2.00  . Smokeless tobacco: Never Used  Substance Use Topics  . Alcohol use: No  . Drug use: No    Home Medications Prior to Admission medications   Medication Sig Start Date End Date Taking? Authorizing Provider  amLODipine (NORVASC) 10 MG tablet Take 1 tablet (10 mg total) by mouth daily. 01/09/19  Yes Connye Burkitt, NP  busPIRone (BUSPAR) 10 MG tablet Take 10 mg by mouth daily.   Yes [provider]  divalproex (DEPAKOTE) 500 MG DR tablet Take 1,000 mg by mouth at bedtime.   Yes [provider]  gabapentin (NEURONTIN) 300 MG capsule Take 600 mg by mouth at bedtime.  Yes [provider]  hydrALAZINE (APRESOLINE) 10 MG tablet Take 10 mg by mouth 2 (two) times daily.   Yes [provider]  ibuprofen (ADVIL) 200 MG tablet Take 400 mg by mouth every 6 (six) hours as needed for moderate pain.   Yes [provider]  insulin NPH-regular Human (70-30) 100 UNIT/ML injection Inject 25 Units into the skin 2 (two) times daily with a meal.   Yes [provider]  lisinopril (ZESTRIL) 40 MG tablet Take 40 mg by mouth daily.   Yes [provider]   mirtazapine (REMERON) 30 MG tablet Take 1 tablet (30 mg total) by mouth at bedtime. 01/08/19  Yes Aldean Baker, NP  pravastatin (PRAVACHOL) 80 MG tablet Take 80 mg by mouth daily.   Yes [provider]  venlafaxine (EFFEXOR) 75 MG tablet Take 150 mg by mouth daily.   Yes [provider]  benazepril (LOTENSIN) 20 MG tablet Take 1 tablet (20 mg total) by mouth daily. Patient not taking: Reported on 08/05/2019 01/09/19   Aldean Baker, NP  gabapentin (NEURONTIN) 100 MG capsule Take 1 capsule (100 mg total) by mouth 2 (two) times daily AND 3 capsules (300 mg total) at bedtime. Patient not taking: Reported on 08/05/2019 01/30/19   Rankin, Shuvon B, NP  insulin glargine (LANTUS) 100 UNIT/ML injection Inject 0.3 mLs (30 Units total) into the skin daily. Patient not taking: Reported on 01/30/2019 01/09/19   Aldean Baker, NP  metFORMIN (GLUCOPHAGE) 500 MG tablet Take 1 tablet (500 mg total) by mouth 2 (two) times daily with a meal. Patient not taking: Reported on 08/05/2019 01/30/19   Rankin, Shuvon B, NP    Allergies    Hctz [hydrochlorothiazide] and Seroquel [quetiapine fumarate]  Review of Systems   Review of Systems  Gastrointestinal: Positive for abdominal pain.  Neurological: Positive for dizziness and light-headedness.  All other systems reviewed and are negative.   Physical Exam Updated Vital Signs BP 92/68   Pulse 87   Temp 98 F (36.7 C) (Oral)   Resp 20   SpO2 94%   Physical Exam Vitals and nursing note reviewed.  Constitutional:      General: He is not in acute distress.    Appearance: He is well-developed.     Comments: Appears uncomfortable, but otherwise nontoxic  HENT:     Head: Normocephalic and atraumatic.  Eyes:     Extraocular Movements: Extraocular movements intact.     Conjunctiva/sclera: Conjunctivae normal.     Pupils: Pupils are equal, round, and reactive to light.  Cardiovascular:     Rate and Rhythm: Normal rate and regular rhythm.      Pulses: Normal pulses.  Pulmonary:     Effort: Pulmonary effort is normal. No respiratory distress.     Breath sounds: Normal breath sounds. No wheezing.  Abdominal:     General: There is no distension.     Palpations: Abdomen is soft. There is no mass.     Tenderness: There is abdominal tenderness. There is no guarding or rebound.     Comments: TTP of the L sided abd upper and lower quadrants. No cva tenderness. No ttp of the R sided abd   Musculoskeletal:        General: Normal range of motion.     Cervical back: Normal range of motion and neck supple.  Skin:    General: Skin is warm and dry.     Capillary Refill: Capillary refill takes less than  2 seconds.  Neurological:     Mental Status: He is alert and oriented to person, place, and time.     ED Results / Procedures / Treatments   Labs (all labs ordered are listed, but only abnormal results are displayed) Labs Reviewed  CBC - Abnormal; Notable for the following components:      Result Value   WBC 12.4 (*)    All other components within normal limits  BASIC METABOLIC PANEL - Abnormal; Notable for the following components:   Sodium 124 (*)    Potassium 5.5 (*)    Chloride 90 (*)    CO2 19 (*)    Glucose, Bld 435 (*)    Creatinine, Ser 0.44 (*)    All other components within normal limits  HEPATIC FUNCTION PANEL - Abnormal; Notable for the following components:   AST 45 (*)    Total Bilirubin 3.5 (*)    Bilirubin, Direct 1.2 (*)    Indirect Bilirubin 2.3 (*)    All other components within normal limits  LIPASE, BLOOD - Abnormal; Notable for the following components:   Lipase 76 (*)    All other components within normal limits  URINALYSIS, ROUTINE W REFLEX MICROSCOPIC - Abnormal; Notable for the following components:   Specific Gravity, Urine 1.031 (*)    Glucose, UA >=500 (*)    Ketones, ur 80 (*)    All other components within normal limits  CBG MONITORING, ED - Abnormal; Notable for the following components:    Glucose-Capillary 289 (*)    All other components within normal limits    EKG EKG Interpretation  Date/Time:  Monday August 06 2019 07:40:21 EST Ventricular Rate:  96 PR Interval:    QRS Duration: 105 QT Interval:  361 QTC Calculation: 457 R Axis:   -46 Text Interpretation: Sinus rhythm Ventricular premature complex Incomplete RBBB and LAFB Abnormal R-wave progression, early transition When compared to prior, less PVC and more artifact. No STEMI Confirmed by Theda Belfastegeler, Chris ([id number]) on 08/06/2019 7:49:16 AM Also confirmed by Theda Belfastegeler, Chris ([id number]), editor Elita QuickWatlington, [place] ([id number])  on 08/06/2019 10:38:12 AM   Radiology CT ABDOMEN PELVIS W CONTRAST  Result Date: 08/05/2019 CLINICAL DATA:  Abdominal pain and back pain EXAM: CT ABDOMEN AND PELVIS WITH CONTRAST TECHNIQUE: Multidetector CT imaging of the abdomen and pelvis was performed using the standard protocol following bolus administration of intravenous contrast. CONTRAST:  125mL OMNIPAQUE IOHEXOL 300 MG/ML  SOLN COMPARISON:  08/03/2015 FINDINGS: LOWER CHEST: No basilar pleural or apical pericardial effusion. HEPATOBILIARY: Diffuse hypoattenuation of the liver relative to the spleen suggests hepatic steatosis. No focal liver lesion or biliary dilatation. Status post cholecystectomy. PANCREAS: Normal pancreas. No ductal dilatation or peripancreatic fluid collection. SPLEEN: Normal. ADRENALS/URINARY TRACT: The adrenal glands are normal. No hydronephrosis, nephroureterolithiasis or solid renal mass. The urinary bladder is normal for degree of distention STOMACH/BOWEL: There is no hiatal hernia. Normal duodenal course and caliber. No small bowel dilatation or inflammation. No focal colonic abnormality. Normal appendix. VASCULAR/LYMPHATIC: There is aortic atherosclerosis with an infrarenal abdominal aortic aneurysm that measures 4.4 cm, previously 3.8 cm. No abdominal or pelvic lymphadenopathy. REPRODUCTIVE: Normal prostate size with symmetric seminal  vesicles. MUSCULOSKELETAL. No bony spinal canal stenosis or focal osseous abnormality. OTHER: None. IMPRESSION: 1. No acute abnormality of the abdomen or pelvis. 2. Infrarenal abdominal aortic aneurysm measuring 4.4 cm, previously 3.8 cm. Recommend followup by ultrasound in 1 year. This recommendation follows ACR consensus guidelines: White Paper of the ACR Incidental Findings Committee II  on Vascular Findings. J Am Coll Radiol 2013; 10:789-794. Aortic aneurysm NOS (ICD10-I71.9) Aortic atherosclerosis (ICD10-I70.0). 3. Hepatic steatosis. Electronically Signed   By: Deatra Robinson M.D.   On: 08/05/2019 04:37    Procedures Procedures (including critical care time)  Medications Ordered in ED Medications  insulin aspart (novoLOG) injection 8 Units (8 Units Subcutaneous Given 08/06/19 0729)  sodium chloride 0.9 % bolus 1,000 mL (0 mLs Intravenous Stopped 08/06/19 0848)  acetaminophen (TYLENOL) tablet 1,000 mg (1,000 mg Oral Given 08/06/19 0728)  morphine 4 MG/ML injection 4 mg (4 mg Intravenous Given 08/06/19 0846)  HYDROmorphone (DILAUDID) injection 0.5 mg (0.5 mg Intravenous Given 08/06/19 1021)  sodium chloride 0.9 % bolus 2,000 mL (2,000 mLs Intravenous New Bag/Given 08/06/19 1055)    ED Course  I have reviewed the triage vital signs and the nursing notes.  Pertinent labs & imaging results that were available during my care of the patient were reviewed by me and considered in my medical decision making (see chart for details).    MDM Rules/Calculators/A&P                      Patient presenting for evaluation of worsening abdominal pain and dizziness.  Physical exam shows patient who appears nontoxic.  He was seen recently for the same, left prior to completion of work-up and management.  At that time, he was found to have severely elevated triglycerides, concern for early pancreatitis.  As such, will obtain labs, treat symptomatically, and reassess.  Labs shows slight hyponatremia at 124,  improved from 2 days ago but worsened from yesterday.  Potassium once again elevated today at 5.5, worsened from yesterday.  CBG elevated at 435.  80 ketones in the urine, bicarb is 19.  Consider early DKA.  Insulin given.  Fluids given.  Lipase and hepatic function pending but delayed due to laboratory equipment issues.  Lipase mildly elevated at 76, this is elevated when compared to 2 days ago.  LFTs show improvement of bili from 2 days ago.  As he has worsening symptoms and slight worsening labs, I am concerned that he will continue to decompensate at home.  Will call for admission, as he will likely need insulin drip and close management.  No repeat CT performed today, as it was done 2 days ago and was normal.  Discussed with Dr. Ophelia Charter and tried hospitalist service.  She will evaluate the patient and assess to see if patient needs to be admitted, observed, or can be discharged.  Requesting further hydration of fluid bolus.  Will hold on insulin drip per request.  Dr. Ophelia Charter evaluated the patient.  Feels his symptoms are likely related to dehydration and do not require insulin drip at this time.  Recommends reevaluation after fluids.  If symptoms are improved and patient feels well, plan for discharge.  On reassessment, patient reports dizziness is improved.  He reports some mild continued discomfort of his stomach and back, but this has been present for a month.  He feels safe and stable to go home.  He will follow closely with his doctor, and return promptly with any worsening symptoms.  At this time, patient appears safe for discharge.  Return precautions given.  Patient states he understands and agrees to plan.  Final Clinical Impression(s) / ED Diagnoses Final diagnoses:  Other acute pancreatitis, unspecified complication status  Hyperkalemia  Hyperglycemia  Hypertriglyceridemia    Rx / DC Orders ED Discharge Orders    None  Alveria Apley, PA-C 08/06/19 1101    Alveria Apley, PA-C 08/06/19 1305    Tegeler, Canary Brim, MD 08/06/19 [phone]

## 2019-08-08 ENCOUNTER — Observation Stay (HOSPITAL_COMMUNITY)
Admission: EM | Admit: 2019-08-08 | Discharge: 2019-08-09 | Disposition: A | Discharge status: Home / Self Care | Payer: No Typology Code available for payment source | Attending: Family Medicine | Admitting: Family Medicine

## 2019-08-08 ENCOUNTER — Emergency Department (HOSPITAL_COMMUNITY): Payer: No Typology Code available for payment source

## 2019-08-08 DIAGNOSIS — F319 Bipolar disorder, unspecified: Secondary | ICD-10-CM | POA: Diagnosis not present

## 2019-08-08 DIAGNOSIS — E1165 Type 2 diabetes mellitus with hyperglycemia: Secondary | ICD-10-CM | POA: Diagnosis not present

## 2019-08-08 DIAGNOSIS — R0789 Other chest pain: Principal | ICD-10-CM | POA: Insufficient documentation

## 2019-08-08 DIAGNOSIS — E1169 Type 2 diabetes mellitus with other specified complication: Secondary | ICD-10-CM | POA: Diagnosis present

## 2019-08-08 DIAGNOSIS — Z79899 Other long term (current) drug therapy: Secondary | ICD-10-CM | POA: Diagnosis not present

## 2019-08-08 DIAGNOSIS — R079 Chest pain, unspecified: Secondary | ICD-10-CM | POA: Diagnosis present

## 2019-08-08 DIAGNOSIS — R627 Adult failure to thrive: Secondary | ICD-10-CM | POA: Insufficient documentation

## 2019-08-08 DIAGNOSIS — Z7982 Long term (current) use of aspirin: Secondary | ICD-10-CM | POA: Insufficient documentation

## 2019-08-08 DIAGNOSIS — I10 Essential (primary) hypertension: Secondary | ICD-10-CM | POA: Insufficient documentation

## 2019-08-08 DIAGNOSIS — F17210 Nicotine dependence, cigarettes, uncomplicated: Secondary | ICD-10-CM | POA: Diagnosis not present

## 2019-08-08 DIAGNOSIS — Z20822 Contact with and (suspected) exposure to covid-19: Secondary | ICD-10-CM | POA: Insufficient documentation

## 2019-08-08 DIAGNOSIS — E1110 Type 2 diabetes mellitus with ketoacidosis without coma: Secondary | ICD-10-CM | POA: Diagnosis present

## 2019-08-08 DIAGNOSIS — I714 Abdominal aortic aneurysm, without rupture, unspecified: Secondary | ICD-10-CM | POA: Diagnosis present

## 2019-08-08 DIAGNOSIS — E669 Obesity, unspecified: Secondary | ICD-10-CM | POA: Insufficient documentation

## 2019-08-08 DIAGNOSIS — F4310 Post-traumatic stress disorder, unspecified: Secondary | ICD-10-CM | POA: Diagnosis present

## 2019-08-08 DIAGNOSIS — E781 Pure hyperglyceridemia: Secondary | ICD-10-CM | POA: Diagnosis not present

## 2019-08-08 DIAGNOSIS — Z794 Long term (current) use of insulin: Secondary | ICD-10-CM | POA: Insufficient documentation

## 2019-08-08 DIAGNOSIS — Z8719 Personal history of other diseases of the digestive system: Secondary | ICD-10-CM | POA: Diagnosis not present

## 2019-08-08 DIAGNOSIS — Z9049 Acquired absence of other specified parts of digestive tract: Secondary | ICD-10-CM | POA: Diagnosis not present

## 2019-08-08 LAB — CBC
HCT: 43.9 % (ref 39.0–52.0)
Hemoglobin: 14.9 g/dL (ref 13.0–17.0)
MCH: 31.1 pg (ref 26.0–34.0)
MCHC: 33.9 g/dL (ref 30.0–36.0)
MCV: 91.6 fL (ref 80.0–100.0)
Platelets: 184 10*3/uL (ref 150–400)
RBC: 4.79 MIL/uL (ref 4.22–5.81)
RDW: 13.5 % (ref 11.5–15.5)
WBC: 8.7 10*3/uL (ref 4.0–10.5)
nRBC: 0 % (ref 0.0–0.2)

## 2019-08-08 LAB — BASIC METABOLIC PANEL
Anion gap: 15 (ref 5–15)
BUN: 12 mg/dL (ref 6–20)
CO2: 21 mmol/L — ABNORMAL LOW (ref 22–32)
Calcium: 9.8 mg/dL (ref 8.9–10.3)
Chloride: 94 mmol/L — ABNORMAL LOW (ref 98–111)
Creatinine, Ser: 0.6 mg/dL — ABNORMAL LOW (ref 0.61–1.24)
GFR calc Af Amer: >60 mL/min (ref >60)
GFR calc non Af Amer: >60 mL/min (ref >60)
Glucose, Bld: 513 mg/dL (ref 70–99)
Potassium: 4.1 mmol/L (ref 3.5–5.1)
Sodium: 130 mmol/L — ABNORMAL LOW (ref 135–145)

## 2019-08-08 LAB — TROPONIN I (HIGH SENSITIVITY): Troponin I (High Sensitivity): 15 ng/L (ref <18)

## 2019-08-08 MED ORDER — SODIUM CHLORIDE 0.9% FLUSH: 3.0000 mL | Freq: Once | INTRAVENOUS | Status: DC

## 2019-08-08 NOTE — ED Triage Notes (Signed)
Pt comes via [facility] for CP that has been going on and off since yesterday, PTA received one nitro and 324 ASA with relief of pain. Pain radiated to R arm and jaw.

## 2019-08-09 ENCOUNTER — Encounter (HOSPITAL_COMMUNITY): Payer: Self-pay | Admitting: Internal Medicine

## 2019-08-09 DIAGNOSIS — E1169 Type 2 diabetes mellitus with other specified complication: Secondary | ICD-10-CM

## 2019-08-09 DIAGNOSIS — R079 Chest pain, unspecified: Secondary | ICD-10-CM | POA: Diagnosis not present

## 2019-08-09 DIAGNOSIS — E669 Obesity, unspecified: Secondary | ICD-10-CM

## 2019-08-09 DIAGNOSIS — E781 Pure hyperglyceridemia: Secondary | ICD-10-CM | POA: Diagnosis not present

## 2019-08-09 LAB — URINALYSIS, ROUTINE W REFLEX MICROSCOPIC
Bacteria, UA: NONE SEEN
Bilirubin Urine: NEGATIVE
Glucose, UA: >=500 mg/dL — AB
Ketones, ur: 20 mg/dL — AB
Leukocytes,Ua: NEGATIVE
Nitrite: NEGATIVE
Protein, ur: NEGATIVE mg/dL
Specific Gravity, Urine: 1.033 — ABNORMAL HIGH (ref 1.005–1.030)
pH: 5 (ref 5.0–8.0)

## 2019-08-09 LAB — SARS CORONAVIRUS 2 (TAT 6-24 HRS): SARS Coronavirus 2: NEGATIVE

## 2019-08-09 LAB — TROPONIN I (HIGH SENSITIVITY)
Troponin I (High Sensitivity): 23 ng/L — ABNORMAL HIGH (ref <18)
Troponin I (High Sensitivity): 18 ng/L — ABNORMAL HIGH (ref <18)

## 2019-08-09 LAB — CBG MONITORING, ED: Glucose-Capillary: 156 mg/dL — ABNORMAL HIGH (ref 70–99)

## 2019-08-09 MED ORDER — ASPIRIN EC 81 MG PO TBEC
81.0000 mg | DELAYED_RELEASE_TABLET | Freq: Once | ORAL | Status: AC
  Administered 2019-08-09: 10:00:00 81 mg via ORAL
  Filled 2019-08-09: qty 1

## 2019-08-09 MED ORDER — ASPIRIN EC 81 MG PO TBEC: 81.0000 mg | DELAYED_RELEASE_TABLET | Freq: Every day | ORAL | Status: DC

## 2019-08-09 MED ORDER — NITROGLYCERIN 0.4 MG SL SUBL
0.4000 mg | SUBLINGUAL_TABLET | SUBLINGUAL | Status: AC | PRN
  Administered 2019-08-09 (×3): 0.4 mg via SUBLINGUAL
  Filled 2019-08-09: qty 1

## 2019-08-09 NOTE — Discharge Summary (Signed)
Physician Discharge Summary  Jenny Omdahl OTL:[id number] DOB: [birth year] DOA: 08/08/2019  PCP: [facility]  Admit date: 08/08/2019 Discharge date: 08/09/2019  Recommendations for Outpatient Follow-up:  Chest pain with some typical features, no known history of CAD --Completely asymptomatic now.  EKG nonacute, troponins flat and unremarkable. --Etiology unclear, I discussed with the patient cannot rule out coronary artery disease at this time and recommended further evaluation with cardiology and stress testing in the near future.  I offered this hospitalization but the patient feels so well that he desires discharge home and promises that he will follow-up with [facility] for stress testing.  He displays an excellent understanding of the current issues and recommendations.  Given his negative work-up here I feel he safe for discharge home, he knows to return for recurrent symptoms. --Home on aspirin.  Close follow-up with PCP and cardiology for consideration of stress testing.  Diabetes mellitus type 2, uncontrolled with hyperglycemia  Infrarenal abdominal aortic aneurysm --Ultrasound follow-up recommended January 2022.  The patient is well aware of this recommendation.  Follow-up Information    [facility] Schedule an appointment as soon as possible for a visit in 1 week(s).   Contact information: [address] [facility] [place] [address] [phone]            Discharge Diagnoses: Principal diagnosis is #1 1. Chest pain with some typical features, no known history of CAD 2. Diabetes mellitus type 2, uncontrolled with hyperglycemia 3. Hypertriglyceridemia, PMH pancreatitis 4. Essential hypertension 5. Bipolar disorder, PTSD 6. Infrarenal abdominal aortic aneurysm  Discharge Condition: improved Disposition: home  Diet recommendation: heat healthy, diabetic diet   History of present illness:  59 year old man PMH  including diabetes mellitus type 2, hypertriglyceridemia, pancreatitis, PTSD presented to the emergency department with chest pain.  Referred for observation for further evaluation of chest pain.  Hospital Course:   Chest pain with some typical features, no known history of CAD --Completely asymptomatic now.  EKG nonacute, troponins flat and unremarkable. --Etiology unclear, I discussed with the patient cannot rule out coronary artery disease at this time and recommended further evaluation with cardiology and stress testing in the near future.  I offered this hospitalization but the patient feels so well that he desires discharge home and promises that he will follow-up with [facility] for stress testing.  He displays an excellent understanding of the current issues and recommendations.  Given his negative work-up here I feel he safe for discharge home, he knows to return for recurrent symptoms. --Home on aspirin.  Close follow-up with PCP and cardiology for consideration of stress testing.  Diabetes mellitus type 2, uncontrolled with hyperglycemia --Glycemic last night but quite stable today.  No evidence of DKA. --Resume medications on discharge  Hypertriglyceridemia, PMH pancreatitis --Continue pravastatin --Consider adding fenofibrate as an outpatient  Failure to thrive, weight loss, suspected to be diabetes, pancreatitis --Follow-up with PCP as an outpatient  Essential hypertension --Quite stable.  Continue chronic medications.  Bipolar disorder, PTSD --Continue venlafaxine, buspirone, Depakote  Infrarenal abdominal aortic aneurysm --Ultrasound follow-up recommended January 2022.  The patient is well aware of this recommendation.  Today's assessment: See H&P same day    Discharge Instructions  Discharge Instructions    Diet - low sodium heart healthy   Complete by: As directed    Diet Carb Modified   Complete by: As directed    Discharge instructions    Complete by: As directed    Call your physician or seek  immediate medical attention for chest pain, shortness of breath or worsening of condition.   Increase activity slowly   Complete by: As directed      Allergies as of 08/09/2019      Reactions   Hctz [hydrochlorothiazide] Other (See Comments)   Unknown rxn, "they just told me not to take it anymore"   Seroquel [quetiapine Fumarate] Other (See Comments)   Elevates liver enzymes      Medication List    TAKE these medications   amLODipine 10 MG tablet Commonly known as: NORVASC Take 1 tablet (10 mg total) by mouth daily.   aspirin EC 81 MG tablet Take 1 tablet (81 mg total) by mouth daily.   busPIRone 10 MG tablet Commonly known as: BUSPAR Take 10 mg by mouth daily.   divalproex 500 MG DR tablet Commonly known as: DEPAKOTE Take 1,000 mg by mouth at bedtime.   gabapentin 300 MG capsule Commonly known as: NEURONTIN Take 600 mg by mouth at bedtime.   hydrALAZINE 10 MG tablet Commonly known as: APRESOLINE Take 10 mg by mouth 2 (two) times daily.   ibuprofen 200 MG tablet Commonly known as: ADVIL Take 400 mg by mouth every 6 (six) hours as needed for moderate pain.   insulin NPH-regular Human (70-30) 100 UNIT/ML injection Inject 25 Units into the skin 2 (two) times daily with a meal.   lisinopril 40 MG tablet Commonly known as: ZESTRIL Take 40 mg by mouth daily.   mirtazapine 30 MG tablet Commonly known as: REMERON Take 1 tablet (30 mg total) by mouth at bedtime.   pravastatin 80 MG tablet Commonly known as: PRAVACHOL Take 80 mg by mouth daily.   venlafaxine 75 MG tablet Commonly known as: EFFEXOR Take 150 mg by mouth every morning.      Allergies  Allergen Reactions  . Hctz [Hydrochlorothiazide] Other (See Comments)    Unknown rxn, "they just told me not to take it anymore"  . Seroquel [Quetiapine Fumarate] Other (See Comments)    Elevates liver enzymes     The results of significant diagnostics  from this hospitalization (including imaging, microbiology, ancillary and laboratory) are listed below for reference.    Significant Diagnostic Studies: DG Chest 2 View  Result Date: 08/08/2019 CLINICAL DATA:  Chest pain, intermittent since yesterday EXAM: CHEST - 2 VIEW COMPARISON:  Radiograph 2017 FINDINGS: Few streaky basilar atelectatic changes. No consolidation, features of edema, pneumothorax, or effusion. Pulmonary vascularity is normally distributed. The cardiomediastinal contours are unremarkable. No acute osseous or soft tissue abnormality. Degenerative changes are present in the imaged spine and shoulders. IMPRESSION: Streaky bibasilar atelectatic changes. Otherwise no acute cardiopulmonary disease. Electronically Signed   By: Kreg Shropshire M.D.   On: 08/08/2019 23:15   CT ABDOMEN PELVIS W CONTRAST  Result Date: 08/05/2019 CLINICAL DATA:  Abdominal pain and back pain EXAM: CT ABDOMEN AND PELVIS WITH CONTRAST TECHNIQUE: Multidetector CT imaging of the abdomen and pelvis was performed using the standard protocol following bolus administration of intravenous contrast. CONTRAST:  OMNIPAQUE IOHEXOL 300 MG/ML  SOLN COMPARISON:  08/03/2015 FINDINGS: LOWER CHEST: No basilar pleural or apical pericardial effusion. HEPATOBILIARY: Diffuse hypoattenuation of the liver relative to the spleen suggests hepatic steatosis. No focal liver lesion or biliary dilatation. Status post cholecystectomy. PANCREAS: Normal pancreas. No ductal dilatation or peripancreatic fluid collection. SPLEEN: Normal. ADRENALS/URINARY TRACT: The adrenal glands are normal. No hydronephrosis, nephroureterolithiasis or solid renal mass. The urinary bladder is normal for degree of distention STOMACH/BOWEL: There is no  hiatal hernia. Normal duodenal course and caliber. No small bowel dilatation or inflammation. No focal colonic abnormality. Normal appendix. VASCULAR/LYMPHATIC: There is aortic atherosclerosis with an infrarenal abdominal  aortic aneurysm that measures 4.4 cm, previously 3.8 cm. No abdominal or pelvic lymphadenopathy. REPRODUCTIVE: Normal prostate size with symmetric seminal vesicles. MUSCULOSKELETAL. No bony spinal canal stenosis or focal osseous abnormality. OTHER: None. IMPRESSION: 1. No acute abnormality of the abdomen or pelvis. 2. Infrarenal abdominal aortic aneurysm measuring 4.4 cm, previously 3.8 cm. Recommend followup by ultrasound in 1 year. This recommendation follows ACR consensus guidelines: White Paper of the ACR Incidental Findings Committee II on Vascular Findings. J Am Coll Radiol 2013; 10:789-794. Aortic aneurysm NOS (ICD10-I71.9) Aortic atherosclerosis (ICD10-I70.0). 3. Hepatic steatosis. Electronically Signed   By: Ulyses Jarred M.D.   On: 08/05/2019 04:37    Microbiology: Recent Results (from the past 240 hour(s))  SARS CORONAVIRUS 2 (TAT 6-24 HRS) Nasopharyngeal Nasopharyngeal Swab     Status: None   Collection Time: 08/05/19  5:38 AM   Specimen: Nasopharyngeal Swab  Result Value Ref Range Status   SARS Coronavirus 2 NEGATIVE NEGATIVE Final    Comment: (NOTE) SARS-CoV-2 target nucleic acids are NOT DETECTED. The SARS-CoV-2 RNA is generally detectable in upper and lower respiratory specimens during the acute phase of infection. Negative results do not preclude SARS-CoV-2 infection, do not rule out co-infections with other pathogens, and should not be used as the sole basis for treatment or other patient management decisions. Negative results must be combined with clinical observations, patient history, and epidemiological information. The expected result is Negative. Fact Sheet for Patients: SugarRoll.be Fact Sheet for Healthcare Providers: https://www.woods-mathews.com/ This test is not yet approved or cleared by the [place] FDA and  has been authorized for detection and/or diagnosis of SARS-CoV-2 by FDA under an Emergency Use Authorization  (EUA). This EUA will remain  in effect (meaning this test can be used) for the duration of the COVID-19 declaration under Section 56 4(b)(1) of the Act, 21 U.S.C. section 360bbb-3(b)(1), unless the authorization is terminated or revoked sooner. Performed at [facility], [place] [address], [place], [place] [address]      Labs: Basic Metabolic Panel: Recent Labs  Lab 08/04/19 2330 08/05/19 0323 08/05/19 0548 08/06/19 0413 08/08/19 2254  NA 113* 129* 131* 124* 130*  K UNABLE TO RESULT DUE TO HEMOLYSIS 5.2* 5.0 5.5* 4.1  CL 82* 106  --  90* 94*  CO2 15*  --   --  19* 21*  GLUCOSE 434* 360*  --  435* 513*  BUN 22* 21*  --  14 12  CREATININE 1.90* 0.50*  --  0.44* 0.60*  CALCIUM 9.4  --   --  9.8 9.8  MG 3.9*  --   --   --   --    Liver Function Tests: Recent Labs  Lab 08/04/19 2330 08/06/19 0448  AST UNABLE TO RESULT DUE TO HEMOLYSIS 45*  ALT UNABLE TO RESULT DUE TO HEMOLYSIS 25  ALKPHOS UNABLE TO RESULT DUE TO HEMOLYSIS 58  BILITOT 5.8* 3.5*  PROT <3.0* 7.4  ALBUMIN 4.1 4.0   Recent Labs  Lab 08/04/19 2143 08/06/19 0448  LIPASE 26 76*   CBC: Recent Labs  Lab 08/05/19 0017 08/05/19 0017 08/05/19 0323 08/05/19 0538 08/05/19 0548 08/06/19 0413 08/08/19 2254  WBC 6.0  --   --  9.2  --  12.4* 8.7  NEUTROABS  --   --   --  4.3  --   --   --  HGB 20.8*   < > 15.3 15.8 15.3 16.1 14.9  HCT 57.3*   < > 45.0 42.1 45.0 44.7 43.9  MCV 90.0  --   --  93.6  --  90.7 91.6  PLT 251  --   --  209  --  182 184   < > = values in this interval not displayed.    CBG: Recent Labs  Lab 08/05/19 0726 08/05/19 0840 08/05/19 0945 08/06/19 1002 08/09/19 0510  GLUCAP 236* 159* 219* 289* 156*    Principal Problem:   Chest pain Active Problems:   Diabetes mellitus type 2 in obese [facility])   PTSD (post-traumatic stress disorder)   Abdominal aortic aneurysm (AAA) (HCC)   DKA, type 2 (HCC)   Time coordinating discharge: 13  Signed:  Brendia Sacks, MD  [facility]  08/09/2019, 9:34 AM

## 2019-08-09 NOTE — ED Notes (Signed)
covid swab collected

## 2019-08-09 NOTE — ED Provider Notes (Signed)
TIME SEEN: 4:54 AM  CHIEF COMPLAINT: chest pain  HPI: Patient is a 59 y.o. M with h/o HTN, DM, HLD, tobacco use who presents to ED with chest pressure that radiates into R jaw and arm.  No changes with exertion.  Episodes occurred at rest.  No cardiologist.  No h/o stress test or catherization.  Unsure if he felt SOB.  Has been lightheaded x 1 month.  Passed out a few days ago.  No CP with that episode.  No N/V.  No sweating.  No fever, cough, COVID exposures.  No history of PE, DVT, exogenous estrogen use, recent fractures, surgery, trauma, hospitalization or prolonged travel. No lower extremity swelling or pain. No calf tenderness.   ROS: See HPI Constitutional: no fever  Eyes: no drainage  ENT: no runny nose   Cardiovascular:  + chest pain  Resp: no SOB  GI: no vomiting GU: no dysuria Integumentary: no rash  Allergy: no hives  Musculoskeletal: no leg swelling  Neurological: no slurred speech ROS otherwise negative  PAST MEDICAL HISTORY/PAST SURGICAL HISTORY:  Past Medical History:  Diagnosis Date  . Arthritis   . Diabetes mellitus without complication (HCC)   . Hypertension   . Pancreatitis   . PTSD (post-traumatic stress disorder)     MEDICATIONS:  Prior to Admission medications   Medication Sig Start Date End Date Taking? Authorizing Provider  amLODipine (NORVASC) 10 MG tablet Take 1 tablet (10 mg total) by mouth daily. 01/09/19   Aldean Baker, NP  benazepril (LOTENSIN) 20 MG tablet Take 1 tablet (20 mg total) by mouth daily. Patient not taking: Reported on 08/05/2019 01/09/19   Aldean Baker, NP  busPIRone (BUSPAR) 10 MG tablet Take 10 mg by mouth daily.    [provider]  divalproex (DEPAKOTE) 500 MG DR tablet Take 1,000 mg by mouth at bedtime.    [provider]  gabapentin (NEURONTIN) 100 MG capsule Take 1 capsule (100 mg total) by mouth 2 (two) times daily AND 3 capsules (300 mg total) at bedtime. Patient not taking: Reported on 08/05/2019 01/30/19    Rankin, Shuvon B, NP  gabapentin (NEURONTIN) 300 MG capsule Take 600 mg by mouth at bedtime.     [provider]  hydrALAZINE (APRESOLINE) 10 MG tablet Take 10 mg by mouth 2 (two) times daily.    [provider]  ibuprofen (ADVIL) 200 MG tablet Take 400 mg by mouth every 6 (six) hours as needed for moderate pain.    [provider]  insulin glargine (LANTUS) 100 UNIT/ML injection Inject 0.3 mLs (30 Units total) into the skin daily. Patient not taking: Reported on 01/30/2019 01/09/19   Aldean Baker, NP  insulin NPH-regular Human (70-30) 100 UNIT/ML injection Inject 25 Units into the skin 2 (two) times daily with a meal.    [provider]  lisinopril (ZESTRIL) 40 MG tablet Take 40 mg by mouth daily.    [provider]  metFORMIN (GLUCOPHAGE) 500 MG tablet Take 1 tablet (500 mg total) by mouth 2 (two) times daily with a meal. Patient not taking: Reported on 08/05/2019 01/30/19   Rankin, Shuvon B, NP  mirtazapine (REMERON) 30 MG tablet Take 1 tablet (30 mg total) by mouth at bedtime. 01/08/19   Aldean Baker, NP  pravastatin (PRAVACHOL) 80 MG tablet Take 80 mg by mouth daily.    [provider]  venlafaxine (EFFEXOR) 75 MG tablet Take 150 mg by mouth daily.    [provider]  ALLERGIES:  Allergies  Allergen Reactions  . Hctz [Hydrochlorothiazide] Other (See Comments)    Unknown rxn, "they just told me not to take it anymore"  . Seroquel [Quetiapine Fumarate] Other (See Comments)    Elevates liver enzymes     SOCIAL HISTORY:  Social History   Tobacco Use  . Smoking status: Current Every Day Smoker    Packs/day: 1.50    Years: 35.00    Pack years: 52.50  . Smokeless tobacco: Never Used  Substance Use Topics  . Alcohol use: No    FAMILY HISTORY: Family History  Problem Relation Age of Onset  . Aneurysm Mother   . Brain cancer Father 57    EXAM: BP (!) 147/90 (BP Location: Left Arm)   Pulse 88   Temp 98 F (36.7  C) (Oral)   Resp 18   SpO2 98%  CONSTITUTIONAL: Alert and oriented and responds appropriately to questions. Well-appearing; well-nourished HEAD: Normocephalic EYES: Conjunctivae clear, pupils appear equal, EOM appear intact ENT: normal nose; moist mucous membranes NECK: Supple, normal ROM CARD: RRR; S1 and S2 appreciated; no murmurs, no clicks, no rubs, no gallops RESP: Normal chest excursion without splinting or tachypnea; breath sounds clear and equal bilaterally; no wheezes, no rhonchi, no rales, no hypoxia or respiratory distress, speaking full sentences ABD/GI: Normal bowel sounds; non-distended; soft, non-tender, no rebound, no guarding, no peritoneal signs, no hepatosplenomegaly BACK:  The back appears normal EXT: Normal ROM in all joints; no deformity noted, no edema; no cyanosis SKIN: Normal color for age and race; warm; no rash on exposed skin NEURO: Moves all extremities equally PSYCH: The patient's mood and manner are appropriate.   MEDICAL DECISION MAKING: Patient here with intermittent chest pain that radiates into his right jaw and down his right arm.  Multiple risk factors for ACS with heart score of 4.  Has never had any provocative testing.  First troponin was 15 and repeat troponin slightly elevated at 23.  EKG shows no new ischemic changes but does show incomplete right bundle branch block and left anterior fascicular block.  Given aspirin with EMS and reports improvement with nitroglycerin.  Having some recurrent chest pain while I am in the room.  Will give nitroglycerin and repeat EKG and troponin.  Will discuss with medicine for admission.  ED PROGRESS: Patient's repeat EKG shows no ischemic change.  Will discuss with medicine.   5:36 AM Discussed patient's case with hospitalist, Dr. Loney Loh.  I have recommended admission and patient (and family if present) agree with this plan. Admitting physician will place admission orders.   I reviewed all nursing notes, vitals,  pertinent previous records and interpreted all EKGs, lab and urine results, imaging (as available).    EKG Interpretation  Date/Time:  Thursday August 09 2019 05:07:55 EST Ventricular Rate:  85 PR Interval:    QRS Duration: 108 QT Interval:  403 QTC Calculation: 480 R Axis:   -46 Text Interpretation: Sinus rhythm Incomplete RBBB and LAFB Abnormal R-wave progression, early transition Borderline prolonged QT interval No significant change since last tracing Confirmed by Rochele Raring [phone]) on 08/09/2019 5:13:24 AM          Lorin Picket Carmie End was evaluated in Emergency Department on 08/09/2019 for the symptoms described in the history of present illness. He was evaluated in the context of the global COVID-19 pandemic, which necessitated consideration that the patient might be at risk for infection with the SARS-CoV-2 virus that causes COVID-19. Institutional protocols and algorithms that pertain  to the evaluation of patients at risk for COVID-19 are in a state of rapid change based on information released by regulatory bodies including the CDC and federal and state organizations. These policies and algorithms were followed during the patient's care in the ED.  Patient was seen wearing N95, face shield, gloves.      Challen Spainhour, Delice Bison, DO 08/09/19 [phone]

## 2019-08-09 NOTE — H&P (Signed)
History and Physical  Jacob Brady JJO:[id number] DOB: [birth year] DOA: 08/08/2019  PCP: [facility]   Chief Complaint: Chest pain  HPI:  59 year old man PMH including diabetes mellitus type 2, hypertriglyceridemia, pancreatitis, PTSD presented to the emergency department with chest pain.  Referred for observation for further evaluation of chest pain.  Patient has been in the emergency department 3 times now in the last few days.  First time for abdominal pain as below, second time for dizziness and hyperglycemia.  He presented again 1/13 for chest pain.  He describes multiple episodes of chest pain yesterday, he describes as a pressure-like sensation without specific aggravating or alleviating factors, sharp with radiation to the jaw and right arm.  He has never had pain like this before, no previous cardiac history, has not seen a cardiologist in the past.  No history of heart disease.  No associated nausea or vomiting.  Mild shortness of breath from the pain.  No history of VTE, no risk factors identified.  Upon evaluation this morning, the patient reports he feels well, chest pains completely resolved, respiratory status is unremarkable and he has no abdominal pain.  He strongly desires to go home.  [facility].  Chart review:  1/9 presented with abdominal pain for 1 month, weight loss 25 pounds, fatigue, urinary frequency. Admitted for DKA, abdominal pain with negative CT scan, pancreatitis considered clinically but not radiographically; acute kidney injury, hypertriglyceridemia. Patient left AMA.  1/11 presented with dizziness for 30 days, left-sided abdominal pain, weight loss. At that time noted to be hyperglycemic with concern for early DKA, elevated lipase consulted with assessment of dehydration, no DKA was recommended for discharge home  ED Course: Nitroglycerin  Review of Systems:  Negative for fever,  visual changes that are new although has had some perhaps blurred vision for the last few weeks.  No sore throat, rash.  No new muscle aches currently.  No dysuria, bleeding, nausea, vomiting, abdominal pain, diarrhea.  PMH . Diabetes mellitus type 2 . Pancreatitis . Hypertriglyceridemia . PTSD, major depressive disorder . Remainder reviewed in Epic  PSH . Cholecystectomy . Wrist fracture surgery . Remainder reviewed in Epic  Family history includes: . Mother with aneurysm . Remainder reviewed in Epic  Social History . Negative for alcohol.   . Smokes 1.5 pack cigarettes per day  Allergies . Hydrochlorothiazide, reaction unknown . Seroquel elevated liver enzymes . Remainder reviewed in Epic  Meds include: . Amlodipine, Depakote, hydralazine, NPH, lisinopril, pravastatin . Remainder reviewed in Epic Prior to Admission medications   Medication Sig Start Date End Date Taking? Authorizing Provider  amLODipine (NORVASC) 10 MG tablet Take 1 tablet (10 mg total) by mouth daily. 01/09/19  Yes Aldean Baker, NP  busPIRone (BUSPAR) 10 MG tablet Take 10 mg by mouth daily.   Yes [provider]  divalproex (DEPAKOTE) 500 MG DR tablet Take 1,000 mg by mouth at bedtime.   Yes [provider]  gabapentin (NEURONTIN) 300 MG capsule Take 600 mg by mouth at bedtime.    Yes [provider]  hydrALAZINE (APRESOLINE) 10 MG tablet Take 10 mg by mouth 2 (two) times daily.   Yes [provider]  ibuprofen (ADVIL) 200 MG tablet Take 400 mg by mouth every 6 (six) hours as needed for moderate pain.   Yes [provider]  insulin NPH-regular Human (70-30) 100 UNIT/ML injection Inject 25 Units into  the skin 2 (two) times daily with a meal.   Yes [provider]  lisinopril (ZESTRIL) 40 MG tablet Take 40 mg by mouth daily.   Yes [provider]  mirtazapine (REMERON) 30 MG tablet Take 1 tablet (30 mg total) by mouth at bedtime. 01/08/19  Yes  Connye Burkitt, NP  pravastatin (PRAVACHOL) 80 MG tablet Take 80 mg by mouth daily.   Yes [provider]  venlafaxine (EFFEXOR) 75 MG tablet Take 150 mg by mouth every morning.    Yes [provider]  aspirin EC 81 MG tablet Take 1 tablet (81 mg total) by mouth daily. 08/09/19 08/08/20  Samuella Cota, MD   Physicial Exam   Vitals:  . 98.0, 20, 75, 117/82, 96% on room air  Constitutional:   . Appears calm and comfortable, well-appearing Eyes:  . pupils and irises appear normal . Normal lids ENMT:  . grossly normal hearing  . Lips appear normal Neck:  . No masses identified . no thyromegaly Respiratory:  . CTA bilaterally, no w/r/r.  . Respiratory effort normal.  Cardiovascular:  . RRR, no m/r/g . No LE extremity edema   Abdomen:  . Soft, nontender, nondistended. Musculoskeletal:  . RUE, LUE  . Grossly normal tone and strength bilaterally Skin:  . No rashes, lesions, ulcers . palpation of skin: no induration or nodules Neurologic:  . Grossly nonfocal Psychiatric:  . Mental status o Mood, affect appropriate . judgment and insight appear intact    I have personally reviewed following labs and imaging studies  Labs:  Marland Kitchen Glucose 513 last night with anion gap of 15, CBC unremarkable . Today CBGs controlled under 300 . Troponins negative  Consults:  . None   Procedures:  . None  Significant Diagnostic Tests:  . 1/9 SARS-CoV-2 negative . 1/13 EKG sinus rhythm, no acute changes, independently reviewed . 1/14 EKG sinus rhythm, no acute changes, independently reviewed . 1/13 chest x-ray no acute disease. That is my interpretation. Radiologist reads atelectasis.   Micro Data:  . None   Antimicrobials:  . None  ASSESSMENT/PLAN  59 year old man PMH as listed presented with multiple episodes of chest pain with some typical features.  Chest pain with some typical features, no known history of CAD --Completely asymptomatic now.  EKG nonacute,  troponins flat and unremarkable. --Etiology unclear, I discussed with the patient cannot rule out coronary artery disease at this time and recommended further evaluation with cardiology and stress testing in the near future.  I offered this hospitalization but the patient feels so well that he desires discharge home and promises that he will follow-up with [facility] for stress testing.  He displays an excellent understanding of the current issues and recommendations.  Given his negative work-up here I feel he safe for discharge home, he knows to return for recurrent symptoms. --Home on aspirin.  Close follow-up with PCP and cardiology for consideration of stress testing.  Diabetes mellitus type 2, uncontrolled with hyperglycemia --Glycemic last night but quite stable today.  No evidence of DKA. --Resume medications on discharge  Hypertriglyceridemia, PMH pancreatitis --Continue pravastatin --Consider adding fenofibrate as an outpatient  Failure to thrive, weight loss, suspected to be diabetes, pancreatitis --Follow-up with PCP as an outpatient  Essential hypertension --Quite stable.  Continue chronic medications.  Bipolar disorder, PTSD --Continue venlafaxine, buspirone, Depakote  Infrarenal abdominal aortic aneurysm --Ultrasound follow-up recommended January 2022.  The patient is well aware of this recommendation.  Cigarette smoker --Patient considering quitting  Time spent: 55 minutes  Brendia Sacks, MD  [facility] Direct contact: see [email or web address]  7PM-7AM contact night coverage as below   1. Check the care team in [facility] and look for a) attending/consulting [facility] provider listed and b) the [facility] team listed 2. Log into [email or web address] and use [facility] universal password to access. If you do not have the password, please contact the hospital operator. 3. Locate the [facility] provider you are looking for under [facility] and page to a number that you can be  directly reached. 4. If you still have difficulty reaching the provider, please page the [facility] (Director on Call) for the Hospitalists listed on amion for assistance.  Severity of Illness: The appropriate patient status for this patient is OBSERVATION. Observation status is judged to be reasonable and necessary in order to provide the required intensity of service to ensure the patient's safety. The patient's presenting symptoms, physical exam findings, and initial radiographic and laboratory data in the context of their medical condition is felt to place them at decreased risk for further clinical deterioration. Furthermore, it is anticipated that the patient will be medically stable for discharge from the hospital within 2 midnights of admission. The following factors support the patient status of observation.   " The patient's presenting symptoms include chest pain with some typical features. " The physical exam findings include benign. " The initial radiographic and laboratory data are reassuring.    08/09/2019, 9:33 AM   Principal Problem:   Chest pain Active Problems:   Diabetes mellitus type 2 in obese [facility])   PTSD (post-traumatic stress disorder)   Abdominal aortic aneurysm (AAA) (HCC)   DKA, type 2 (HCC)

## 2019-08-14 ENCOUNTER — Emergency Department (HOSPITAL_COMMUNITY): Payer: No Typology Code available for payment source

## 2019-08-14 ENCOUNTER — Emergency Department (HOSPITAL_COMMUNITY)
Admission: EM | Admit: 2019-08-14 | Discharge: 2019-08-15 | Disposition: A | Discharge status: Home / Self Care | Payer: No Typology Code available for payment source | Attending: Emergency Medicine | Admitting: Emergency Medicine

## 2019-08-14 ENCOUNTER — Encounter (HOSPITAL_COMMUNITY): Payer: Self-pay

## 2019-08-14 DIAGNOSIS — Z79899 Other long term (current) drug therapy: Secondary | ICD-10-CM | POA: Insufficient documentation

## 2019-08-14 DIAGNOSIS — R079 Chest pain, unspecified: Secondary | ICD-10-CM

## 2019-08-14 DIAGNOSIS — R6884 Jaw pain: Secondary | ICD-10-CM | POA: Insufficient documentation

## 2019-08-14 DIAGNOSIS — E119 Type 2 diabetes mellitus without complications: Secondary | ICD-10-CM | POA: Insufficient documentation

## 2019-08-14 DIAGNOSIS — R0789 Other chest pain: Secondary | ICD-10-CM | POA: Insufficient documentation

## 2019-08-14 DIAGNOSIS — I10 Essential (primary) hypertension: Secondary | ICD-10-CM | POA: Insufficient documentation

## 2019-08-14 DIAGNOSIS — M542 Cervicalgia: Secondary | ICD-10-CM | POA: Insufficient documentation

## 2019-08-14 DIAGNOSIS — Z7982 Long term (current) use of aspirin: Secondary | ICD-10-CM | POA: Diagnosis not present

## 2019-08-14 DIAGNOSIS — Z794 Long term (current) use of insulin: Secondary | ICD-10-CM | POA: Insufficient documentation

## 2019-08-14 LAB — BASIC METABOLIC PANEL
Anion gap: 13 (ref 5–15)
BUN: 16 mg/dL (ref 6–20)
CO2: 22 mmol/L (ref 22–32)
Calcium: 9.5 mg/dL (ref 8.9–10.3)
Chloride: 95 mmol/L — ABNORMAL LOW (ref 98–111)
Creatinine, Ser: 0.98 mg/dL (ref 0.61–1.24)
GFR calc Af Amer: >60 mL/min (ref >60)
GFR calc non Af Amer: >60 mL/min (ref >60)
Glucose, Bld: 465 mg/dL — ABNORMAL HIGH (ref 70–99)
Potassium: 4.5 mmol/L (ref 3.5–5.1)
Sodium: 130 mmol/L — ABNORMAL LOW (ref 135–145)

## 2019-08-14 LAB — CBC
HCT: 45.4 % (ref 39.0–52.0)
Hemoglobin: 15.4 g/dL (ref 13.0–17.0)
MCH: 31 pg (ref 26.0–34.0)
MCHC: 33.9 g/dL (ref 30.0–36.0)
MCV: 91.5 fL (ref 80.0–100.0)
Platelets: 268 10*3/uL (ref 150–400)
RBC: 4.96 MIL/uL (ref 4.22–5.81)
RDW: 13.1 % (ref 11.5–15.5)
WBC: 10 10*3/uL (ref 4.0–10.5)
nRBC: 0 % (ref 0.0–0.2)

## 2019-08-14 LAB — TROPONIN I (HIGH SENSITIVITY): Troponin I (High Sensitivity): 11 ng/L (ref <18)

## 2019-08-14 MED ORDER — SODIUM CHLORIDE 0.9% FLUSH: 3.0000 mL | Freq: Once | INTRAVENOUS | Status: DC

## 2019-08-14 NOTE — ED Triage Notes (Signed)
Pt reports that he has been having CP that has been going on and off since this morning, reports dizziness, radiation to jaw and R arm.

## 2019-08-15 ENCOUNTER — Other Ambulatory Visit: Payer: Self-pay

## 2019-08-15 LAB — TROPONIN I (HIGH SENSITIVITY): Troponin I (High Sensitivity): 14 ng/L (ref <18)

## 2019-08-15 LAB — CBG MONITORING, ED: Glucose-Capillary: 325 mg/dL — ABNORMAL HIGH (ref 70–99)

## 2019-08-15 MED ORDER — INSULIN ASPART 100 UNIT/ML ~~LOC~~ SOLN
10.0000 [IU] | Freq: Once | SUBCUTANEOUS | Status: AC
  Administered 2019-08-15: 03:00:00 10 [IU] via SUBCUTANEOUS

## 2019-08-15 NOTE — ED Notes (Signed)
Discharge instructions discussed with pt. Pt verbalized understanding with no questions at this time. Pt to follow up with cardiology 

## 2019-08-15 NOTE — ED Provider Notes (Signed)
[name] EMERGENCY DEPARTMENT Provider Note   CSN: [id number] Arrival date & time: 08/14/19  2117     History Chief Complaint  Patient presents with  . Chest Pain    Jacob Brady is a 59 y.o. male.  Patient presents to the emergency department for evaluation of persistent episodes of chest pain.  He reports that this has been ongoing for 2 weeks or more.  He was admitted to the hospital January 13 for this.  Since discharge he reports that he has continued episodes of severe chest pain with radiation of pain to the right neck.  These episodes occur sporadically.  They last anywhere from 30 seconds to 7 minutes.  Nothing causes them and he has not found anything that alleviates the pain.  He simply waits it out and then it resolves.  In between episodes he has no complaints.     HPI: A 59 year old patient with a history of treated diabetes, hypertension and hypercholesterolemia presents for evaluation of chest pain. Initial onset of pain was less than one hour ago. The patient's chest pain is not worse with exertion. The patient's chest pain is middle- or left-sided, is not well-localized, is not described as heaviness/pressure/tightness, is not sharp and does radiate to the arms/jaw/neck. The patient does not complain of nausea and denies diaphoresis. The patient has smoked in the past 90 days. The patient has no history of stroke, has no history of peripheral artery disease, has no relevant family history of coronary artery disease (first degree relative at less than age 50) and does not have an elevated BMI (>=30).   Past Medical History:  Diagnosis Date  . Arthritis   . Diabetes mellitus without complication (HCC)   . Hypertension   . Pancreatitis   . PTSD (post-traumatic stress disorder)     Patient Active Problem List   Diagnosis Date Noted  . Chest pain 08/09/2019  . Dehydration 08/06/2019  . Back pain 08/06/2019  . DKA, type 2 (HCC) 08/05/2019   . Weight loss 08/05/2019  . Hyponatremia 08/05/2019  . AKI (acute kidney injury) (HCC) 08/05/2019  . History of pancreatitis 08/05/2019  . Abnormal liver enzymes 08/05/2019  . MDD (major depressive disorder), recurrent episode, moderate (HCC)   . Severe recurrent major depression without psychotic features (HCC) 01/03/2019  . MDD (major depressive disorder), recurrent episode, severe (HCC) 01/03/2019  . Pancreatitis 08/03/2015  . Abnormal EKG 08/03/2015  . Essential hypertension 08/03/2015  . Diabetes mellitus type 2 in obese (HCC) 08/03/2015  . PTSD (post-traumatic stress disorder) 08/03/2015  . Abdominal aortic aneurysm (AAA) (HCC) 08/03/2015  . Acute pancreatitis 08/03/2015    Past Surgical History:  Procedure Laterality Date  . CHOLECYSTECTOMY    . WRIST FRACTURE SURGERY Right        Family History  Problem Relation Age of Onset  . Aneurysm Mother   . Brain cancer Father 14    Social History   Tobacco Use  . Smoking status: Current Every Day Smoker    Packs/day: 1.50    Years: 35.00    Pack years: 52.50  . Smokeless tobacco: Never Used  Substance Use Topics  . Alcohol use: No  . Drug use: No    Home Medications Prior to Admission medications   Medication Sig Start Date End Date Taking? Authorizing Provider  amLODipine (NORVASC) 10 MG tablet Take 1 tablet (10 mg total) by mouth daily. 01/09/19  Yes Aldean Baker, NP  aspirin EC 81 MG  tablet Take 1 tablet (81 mg total) by mouth daily. 08/09/19 08/08/20 Yes Samuella Cota, MD  busPIRone (BUSPAR) 10 MG tablet Take 10 mg by mouth at bedtime.    Yes [provider]  divalproex (DEPAKOTE) 500 MG DR tablet Take 1,000 mg by mouth at bedtime.   Yes [provider]  gabapentin (NEURONTIN) 300 MG capsule Take 600 mg by mouth at bedtime.    Yes [provider]  hydrALAZINE (APRESOLINE) 10 MG tablet Take 10 mg by mouth 2 (two) times daily.   Yes [provider]  ibuprofen (ADVIL) 200  MG tablet Take 400 mg by mouth every 6 (six) hours as needed for moderate pain.   Yes [provider]  insulin NPH-regular Human (70-30) 100 UNIT/ML injection Inject 25 Units into the skin 2 (two) times daily with a meal.   Yes [provider]  lisinopril (ZESTRIL) 40 MG tablet Take 40 mg by mouth daily.   Yes [provider]  mirtazapine (REMERON) 30 MG tablet Take 1 tablet (30 mg total) by mouth at bedtime. 01/08/19  Yes Connye Burkitt, NP  pravastatin (PRAVACHOL) 80 MG tablet Take 80 mg by mouth at bedtime.    Yes [provider]  venlafaxine (EFFEXOR) 75 MG tablet Take 150 mg by mouth every morning.    Yes [provider]    Allergies    Hctz [hydrochlorothiazide] and Seroquel [quetiapine fumarate]  Review of Systems   Review of Systems  Cardiovascular: Positive for chest pain.  All other systems reviewed and are negative.   Physical Exam Updated Vital Signs BP (!) 172/96 (BP Location: Right Arm)   Pulse 65   Temp 97.6 F (36.4 C) (Oral)   Resp 13   SpO2 100%   Physical Exam Vitals and nursing note reviewed.  Constitutional:      General: He is not in acute distress.    Appearance: Normal appearance. He is well-developed.  HENT:     Head: Normocephalic and atraumatic.     Right Ear: Hearing normal.     Left Ear: Hearing normal.     Nose: Nose normal.  Eyes:     Conjunctiva/sclera: Conjunctivae normal.     Pupils: Pupils are equal, round, and reactive to light.  Cardiovascular:     Rate and Rhythm: Regular rhythm.     Heart sounds: S1 normal and S2 normal. No murmur. No friction rub. No gallop.   Pulmonary:     Effort: Pulmonary effort is normal. No respiratory distress.     Breath sounds: Normal breath sounds.  Chest:     Chest wall: No tenderness.  Abdominal:     General: Bowel sounds are normal.     Palpations: Abdomen is soft.     Tenderness: There is no abdominal tenderness. There is no guarding or rebound. Negative  signs include Murphy's sign and McBurney's sign.     Hernia: No hernia is present.  Musculoskeletal:        General: Normal range of motion.     Cervical back: Normal range of motion and neck supple.  Skin:    General: Skin is warm and dry.     Findings: No rash.  Neurological:     Mental Status: He is alert and oriented to person, place, and time.     GCS: GCS eye subscore is 4. GCS verbal subscore is 5. GCS motor subscore is 6.     Cranial Nerves: No cranial nerve deficit.  Sensory: No sensory deficit.     Coordination: Coordination normal.  Psychiatric:        Speech: Speech normal.        Behavior: Behavior normal.        Thought Content: Thought content normal.     ED Results / Procedures / Treatments   Labs (all labs ordered are listed, but only abnormal results are displayed) Labs Reviewed  BASIC METABOLIC PANEL - Abnormal; Notable for the following components:      Result Value   Sodium 130 (*)    Chloride 95 (*)    Glucose, Bld 465 (*)    All other components within normal limits  CBG MONITORING, ED - Abnormal; Notable for the following components:   Glucose-Capillary 325 (*)    All other components within normal limits  CBC  TROPONIN I (HIGH SENSITIVITY)  TROPONIN I (HIGH SENSITIVITY)    EKG EKG Interpretation  Date/Time:  Wednesday August 15 2019 01:48:02 EST Ventricular Rate:  83 PR Interval:    QRS Duration: 103 QT Interval:  383 QTC Calculation: 450 R Axis:   -54 Text Interpretation: Sinus rhythm Incomplete RBBB and LAFB Abnormal R-wave progression, early transition No significant change since last tracing Confirmed by [name] [phone]) on 08/15/2019 2:35:49 AM   Radiology DG Chest 2 View  Result Date: 08/14/2019 CLINICAL DATA:  Chest pain EXAM: CHEST - 2 VIEW COMPARISON:  08/07/2018 FINDINGS: The heart size and mediastinal contours are within normal limits. Both lungs are clear. The visualized skeletal structures are unremarkable.  IMPRESSION: No active cardiopulmonary disease. Electronically Signed   By: Jasmine Pang M.D.   On: 08/14/2019 21:59    Procedures Procedures (including critical care time)  Medications Ordered in ED Medications  sodium chloride flush (NS) 0.9 % injection 3 mL (3 mLs Intravenous Not Given 08/15/19 0153)  insulin aspart (novoLOG) injection 10 Units (10 Units Subcutaneous Given 08/15/19 0303)    ED Course  I have reviewed the triage vital signs and the nursing notes.  Pertinent labs & imaging results that were available during my care of the patient were reviewed by me and considered in my medical decision making (see chart for details).    MDM Rules/Calculators/A&P HEAR Score: 5                    Patient presents to the emergency department for evaluation of chest pain.  Patient was hospitalized less than a week ago with uncontrolled diabetes and chest pain.  During that hospital stay he ruled out with serial troponins, no cardiac imaging was performed.  He was to follow-up at the [facility] for possible stress test but this has not occurred yet.  Patient's symptoms are a mixed picture.  He does have pain in the chest that radiates to the right side of his neck and jaw.  This usually only lasts less than a minute in at most around 5 minutes and does not occur with exertion.  He does, however, have multiple cardiac risk factors.  He has had 2 - troponins.  EKG is unchanged from previous.  Hear score is five.  He was therefore discussed with Dr. Deforest Hoyles, on-call for cardiology.  He was able to look at the patient's EKGs and lab work.  He did not feel that the patient required repeat admission at this time.  He did feel that the patient was safe for discharge with prompt follow-up with cardiology.  I discussed this with the patient  and he is in agreement with this plan.  He understands if he has any changes in his symptoms or any concerns about his chest pain he can return to the ER for repeat evaluation.   He reports that he has had some difficulty with transportation getting to the [place], was given follow-up information for cardiology here at [facility] as well. Final Clinical Impression(s) / ED Diagnoses Final diagnoses:  Chest pain, unspecified type    Rx / DC Orders ED Discharge Orders    None       Wylene Weissman, Canary Brim, MD 08/15/19 [phone]

## 2019-08-15 NOTE — Discharge Instructions (Signed)
Please take an aspirin daily.  Call the [facility] for follow-up to be seen by a cardiologist as soon as possible.  If you cannot get there or they cannot see you promptly, please call our cardiology group listed to be seen in the office.  If you have any worsening or change in your pain, return to the ER for repeat evaluation.

## 2019-08-26 ENCOUNTER — Other Ambulatory Visit: Payer: Self-pay

## 2019-08-26 ENCOUNTER — Encounter (HOSPITAL_COMMUNITY): Payer: Self-pay | Admitting: Emergency Medicine

## 2019-08-26 ENCOUNTER — Inpatient Hospital Stay (HOSPITAL_COMMUNITY)
Admission: EM | Admit: 2019-08-26 | Discharge: 2019-09-06 | DRG: 233 | Disposition: A | Discharge status: Home / Self Care | Payer: No Typology Code available for payment source | Attending: Cardiothoracic Surgery | Admitting: Cardiothoracic Surgery

## 2019-08-26 ENCOUNTER — Emergency Department (HOSPITAL_COMMUNITY): Payer: No Typology Code available for payment source

## 2019-08-26 DIAGNOSIS — I4510 Unspecified right bundle-branch block: Secondary | ICD-10-CM | POA: Diagnosis present

## 2019-08-26 DIAGNOSIS — I10 Essential (primary) hypertension: Secondary | ICD-10-CM | POA: Diagnosis present

## 2019-08-26 DIAGNOSIS — I200 Unstable angina: Secondary | ICD-10-CM | POA: Diagnosis present

## 2019-08-26 DIAGNOSIS — E871 Hypo-osmolality and hyponatremia: Secondary | ICD-10-CM | POA: Diagnosis present

## 2019-08-26 DIAGNOSIS — F4310 Post-traumatic stress disorder, unspecified: Secondary | ICD-10-CM | POA: Diagnosis present

## 2019-08-26 DIAGNOSIS — D62 Acute posthemorrhagic anemia: Secondary | ICD-10-CM | POA: Diagnosis not present

## 2019-08-26 DIAGNOSIS — K859 Acute pancreatitis without necrosis or infection, unspecified: Secondary | ICD-10-CM | POA: Diagnosis present

## 2019-08-26 DIAGNOSIS — R600 Localized edema: Secondary | ICD-10-CM | POA: Diagnosis not present

## 2019-08-26 DIAGNOSIS — Z794 Long term (current) use of insulin: Secondary | ICD-10-CM

## 2019-08-26 DIAGNOSIS — E7800 Pure hypercholesterolemia, unspecified: Secondary | ICD-10-CM | POA: Diagnosis present

## 2019-08-26 DIAGNOSIS — I444 Left anterior fascicular block: Secondary | ICD-10-CM | POA: Diagnosis present

## 2019-08-26 DIAGNOSIS — M549 Dorsalgia, unspecified: Secondary | ICD-10-CM | POA: Diagnosis present

## 2019-08-26 DIAGNOSIS — E781 Pure hyperglyceridemia: Secondary | ICD-10-CM | POA: Diagnosis present

## 2019-08-26 DIAGNOSIS — M79601 Pain in right arm: Secondary | ICD-10-CM | POA: Diagnosis present

## 2019-08-26 DIAGNOSIS — Z808 Family history of malignant neoplasm of other organs or systems: Secondary | ICD-10-CM

## 2019-08-26 DIAGNOSIS — Z6835 Body mass index (BMI) 35.0-35.9, adult: Secondary | ICD-10-CM

## 2019-08-26 DIAGNOSIS — I714 Abdominal aortic aneurysm, without rupture: Secondary | ICD-10-CM | POA: Diagnosis present

## 2019-08-26 DIAGNOSIS — R7989 Other specified abnormal findings of blood chemistry: Secondary | ICD-10-CM

## 2019-08-26 DIAGNOSIS — F339 Major depressive disorder, recurrent, unspecified: Secondary | ICD-10-CM | POA: Diagnosis present

## 2019-08-26 DIAGNOSIS — R6884 Jaw pain: Secondary | ICD-10-CM | POA: Diagnosis present

## 2019-08-26 DIAGNOSIS — E1110 Type 2 diabetes mellitus with ketoacidosis without coma: Secondary | ICD-10-CM

## 2019-08-26 DIAGNOSIS — Z951 Presence of aortocoronary bypass graft: Secondary | ICD-10-CM

## 2019-08-26 DIAGNOSIS — Z736 Limitation of activities due to disability: Secondary | ICD-10-CM

## 2019-08-26 DIAGNOSIS — Z09 Encounter for follow-up examination after completed treatment for conditions other than malignant neoplasm: Secondary | ICD-10-CM

## 2019-08-26 DIAGNOSIS — I119 Hypertensive heart disease without heart failure: Secondary | ICD-10-CM | POA: Diagnosis present

## 2019-08-26 DIAGNOSIS — Z79899 Other long term (current) drug therapy: Secondary | ICD-10-CM

## 2019-08-26 DIAGNOSIS — E785 Hyperlipidemia, unspecified: Secondary | ICD-10-CM | POA: Diagnosis present

## 2019-08-26 DIAGNOSIS — Z20822 Contact with and (suspected) exposure to covid-19: Secondary | ICD-10-CM | POA: Diagnosis present

## 2019-08-26 DIAGNOSIS — E1169 Type 2 diabetes mellitus with other specified complication: Secondary | ICD-10-CM | POA: Diagnosis present

## 2019-08-26 DIAGNOSIS — F17210 Nicotine dependence, cigarettes, uncomplicated: Secondary | ICD-10-CM | POA: Diagnosis present

## 2019-08-26 DIAGNOSIS — R778 Other specified abnormalities of plasma proteins: Secondary | ICD-10-CM

## 2019-08-26 DIAGNOSIS — Z888 Allergy status to other drugs, medicaments and biological substances status: Secondary | ICD-10-CM

## 2019-08-26 DIAGNOSIS — Z7982 Long term (current) use of aspirin: Secondary | ICD-10-CM

## 2019-08-26 DIAGNOSIS — Z9889 Other specified postprocedural states: Secondary | ICD-10-CM

## 2019-08-26 DIAGNOSIS — E669 Obesity, unspecified: Secondary | ICD-10-CM | POA: Diagnosis present

## 2019-08-26 DIAGNOSIS — M1990 Unspecified osteoarthritis, unspecified site: Secondary | ICD-10-CM | POA: Diagnosis present

## 2019-08-26 DIAGNOSIS — E8881 Metabolic syndrome: Secondary | ICD-10-CM | POA: Diagnosis present

## 2019-08-26 DIAGNOSIS — I25110 Atherosclerotic heart disease of native coronary artery with unstable angina pectoris: Principal | ICD-10-CM | POA: Diagnosis present

## 2019-08-26 DIAGNOSIS — I2510 Atherosclerotic heart disease of native coronary artery without angina pectoris: Secondary | ICD-10-CM

## 2019-08-26 DIAGNOSIS — I959 Hypotension, unspecified: Secondary | ICD-10-CM | POA: Diagnosis not present

## 2019-08-26 DIAGNOSIS — J9811 Atelectasis: Secondary | ICD-10-CM | POA: Diagnosis present

## 2019-08-26 DIAGNOSIS — E119 Type 2 diabetes mellitus without complications: Secondary | ICD-10-CM | POA: Diagnosis present

## 2019-08-26 DIAGNOSIS — R000 Tachycardia, unspecified: Secondary | ICD-10-CM | POA: Diagnosis present

## 2019-08-26 DIAGNOSIS — R45851 Suicidal ideations: Secondary | ICD-10-CM

## 2019-08-26 DIAGNOSIS — I2584 Coronary atherosclerosis due to calcified coronary lesion: Secondary | ICD-10-CM | POA: Diagnosis present

## 2019-08-26 DIAGNOSIS — R079 Chest pain, unspecified: Secondary | ICD-10-CM | POA: Diagnosis present

## 2019-08-26 LAB — COMPREHENSIVE METABOLIC PANEL
ALT: 23 U/L (ref 0–44)
AST: 25 U/L (ref 15–41)
Albumin: 3.9 g/dL (ref 3.5–5.0)
Alkaline Phosphatase: 70 U/L (ref 38–126)
Anion gap: 20 — ABNORMAL HIGH (ref 5–15)
BUN: 18 mg/dL (ref 6–20)
CO2: 16 mmol/L — ABNORMAL LOW (ref 22–32)
Calcium: 10.4 mg/dL — ABNORMAL HIGH (ref 8.9–10.3)
Chloride: 92 mmol/L — ABNORMAL LOW (ref 98–111)
Creatinine, Ser: 1.01 mg/dL (ref 0.61–1.24)
GFR calc Af Amer: >60 mL/min (ref >60)
GFR calc non Af Amer: >60 mL/min (ref >60)
Glucose, Bld: 639 mg/dL (ref 70–99)
Potassium: 4.9 mmol/L (ref 3.5–5.1)
Sodium: 128 mmol/L — ABNORMAL LOW (ref 135–145)
Total Bilirubin: 1.7 mg/dL — ABNORMAL HIGH (ref 0.3–1.2)
Total Protein: 7.5 g/dL (ref 6.5–8.1)

## 2019-08-26 LAB — POCT I-STAT EG7
Acid-Base Excess: 3 mmol/L — ABNORMAL HIGH (ref 0.0–2.0)
Bicarbonate: 28.4 mmol/L — ABNORMAL HIGH (ref 20.0–28.0)
Calcium, Ion: 1.26 mmol/L (ref 1.15–1.40)
HCT: 49 % (ref 39.0–52.0)
Hemoglobin: 16.7 g/dL (ref 13.0–17.0)
O2 Saturation: 43 %
Potassium: 5.3 mmol/L — ABNORMAL HIGH (ref 3.5–5.1)
Sodium: 126 mmol/L — ABNORMAL LOW (ref 135–145)
TCO2: 30 mmol/L (ref 22–32)
pCO2, Ven: 42.6 mmHg — ABNORMAL LOW (ref 44.0–60.0)
pH, Ven: 7.431 — ABNORMAL HIGH (ref 7.250–7.430)
pO2, Ven: 24 mmHg — CL (ref 32.0–45.0)

## 2019-08-26 LAB — CBC
HCT: 47.2 % (ref 39.0–52.0)
Hemoglobin: 17.2 g/dL — ABNORMAL HIGH (ref 13.0–17.0)
MCH: 33 pg (ref 26.0–34.0)
MCHC: 36.4 g/dL — ABNORMAL HIGH (ref 30.0–36.0)
MCV: 90.4 fL (ref 80.0–100.0)
Platelets: 254 10*3/uL (ref 150–400)
RBC: 5.22 MIL/uL (ref 4.22–5.81)
RDW: 13.1 % (ref 11.5–15.5)
WBC: 9.8 10*3/uL (ref 4.0–10.5)
nRBC: 0 % (ref 0.0–0.2)

## 2019-08-26 LAB — RAPID URINE DRUG SCREEN, HOSP PERFORMED
Amphetamines: NOT DETECTED
Barbiturates: NOT DETECTED
Benzodiazepines: NOT DETECTED
Cocaine: NOT DETECTED
Opiates: NOT DETECTED
Tetrahydrocannabinol: NOT DETECTED

## 2019-08-26 LAB — CBG MONITORING, ED: Glucose-Capillary: 562 mg/dL (ref 70–99)

## 2019-08-26 LAB — TROPONIN I (HIGH SENSITIVITY)
Troponin I (High Sensitivity): 22 ng/L — ABNORMAL HIGH (ref <18)
Troponin I (High Sensitivity): 23 ng/L — ABNORMAL HIGH (ref <18)

## 2019-08-26 LAB — SALICYLATE LEVEL: Salicylate Lvl: <7 mg/dL — ABNORMAL LOW (ref 7.0–30.0)

## 2019-08-26 LAB — BETA-HYDROXYBUTYRIC ACID: Beta-Hydroxybutyric Acid: 0.91 mmol/L — ABNORMAL HIGH (ref 0.05–0.27)

## 2019-08-26 LAB — ACETAMINOPHEN LEVEL: Acetaminophen (Tylenol), Serum: <10 ug/mL — ABNORMAL LOW (ref 10–30)

## 2019-08-26 MED ORDER — POTASSIUM CHLORIDE 10 MEQ/100ML IV SOLN
10.0000 meq | INTRAVENOUS | Status: AC
  Administered 2019-08-26 – 2019-08-27 (×2): 10 meq via INTRAVENOUS
  Filled 2019-08-26 (×2): qty 100

## 2019-08-26 MED ORDER — DEXTROSE 50 % IV SOLN: 0.0000 mL | INTRAVENOUS | Status: DC | PRN

## 2019-08-26 MED ORDER — INSULIN REGULAR(HUMAN) IN NACL 100-0.9 UT/100ML-% IV SOLN
INTRAVENOUS | Status: DC
  Administered 2019-08-27: 19 [IU]/h via INTRAVENOUS
  Filled 2019-08-26 (×2): qty 100

## 2019-08-26 MED ORDER — SODIUM CHLORIDE 0.9 % IV SOLN: INTRAVENOUS | Status: DC

## 2019-08-26 MED ORDER — SODIUM CHLORIDE 0.9 % IV BOLUS
500.0000 mL | Freq: Once | INTRAVENOUS | Status: AC
  Administered 2019-08-26: 500 mL via INTRAVENOUS

## 2019-08-26 MED ORDER — SODIUM CHLORIDE 0.9% FLUSH: 3.0000 mL | Freq: Once | INTRAVENOUS | Status: DC

## 2019-08-26 MED ORDER — DEXTROSE-NACL 5-0.45 % IV SOLN
INTRAVENOUS | Status: DC
  Filled 2019-08-26: qty 1000

## 2019-08-26 MED ORDER — ASPIRIN 81 MG PO CHEW
324.0000 mg | CHEWABLE_TABLET | Freq: Once | ORAL | Status: AC
  Administered 2019-08-26: 324 mg via ORAL
  Filled 2019-08-26: qty 4
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
## 2019-08-26 NOTE — ED Notes (Signed)
Dr Floyd informed of EG7 results 

## 2019-08-26 NOTE — ED Provider Notes (Signed)
[name]   CSN: [id number] Arrival date & time: 08/26/19  2035     History Chief Complaint  Patient presents with  . Chest Pain  . Suicidal    Jacob Brady is a 59 y.o. male with a hx of HTN, IDDM, HLD, tobacco use  presents to the Emergency Department complaining of intermittent, progressively worsening central chest pain with radiation into the jaw and right arm onset 1 month ago.  Pt reports the pain is intense rated at a 9/10 and lasts approx 7-10 minutes.  He reports he then has episodes approx every 5 min for 4-6 hours before the pain resolves.  Pt reports he has multiple episodes per day.  He reports episodes wake him from sleep at night.  They are always triggered when he smokes, but also occur without provocation.  Nothing alleviates his pain.  He reports he often feels dizzy when the pain is present but not at other times.  Denies syncope. He reports [facility] but has been unable to get an appointment or arrange transportation for this.  Pt denies associated N/V, diaphoresis.  Pt reports he is taking his medications as directed including his insulin.  He reports some increased thirst over the last few days, but no polyuria.  Additionally, pt reports he has felt suicidal for the last 2 days because he cannot live with the chest pain anymore.  Pt repots yesterday he borrowed a loaded hand gun and practiced shooting himself in the head.  He denies other methods of suicide attempt.  Pt denies known sick contacts, fever, chills, headache, abd pain, N/V/D, weakness, dysuria.    The history is provided by the patient and medical records. No language interpreter was used.    HPI: A 59 year old patient with a history of treated diabetes, hypertension and hypercholesterolemia presents for evaluation of chest pain. Initial onset of pain was approximately 1-3 hours ago. The patient's  chest pain is described as heaviness/pressure/tightness and is not worse with exertion. The patient's chest pain is middle- or left-sided, is not well-localized, is not sharp and does radiate to the arms/jaw/neck. The patient does not complain of nausea and denies diaphoresis. The patient has smoked in the past 90 days. The patient has no history of stroke, has no history of peripheral artery disease, has no relevant family history of coronary artery disease (first degree relative at less than age 67) and does not have an elevated BMI (>=30).   Past Medical History:  Diagnosis Date  . Arthritis   . Diabetes mellitus without complication (HCC)   . Hypertension   . Pancreatitis   . PTSD (post-traumatic stress disorder)     Patient Active Problem List   Diagnosis Date Noted  . Chest pain 08/09/2019  . Dehydration 08/06/2019  . Back pain 08/06/2019  . DKA, type 2 (HCC) 08/05/2019  . Weight loss 08/05/2019  . Hyponatremia 08/05/2019  . AKI (acute kidney injury) (HCC) 08/05/2019  . History of pancreatitis 08/05/2019  . Abnormal liver enzymes 08/05/2019  . MDD (major depressive disorder), recurrent episode, moderate (HCC)   . Severe recurrent major depression without psychotic features (HCC) 01/03/2019  . MDD (major depressive disorder), recurrent episode, severe (HCC) 01/03/2019  . Pancreatitis 08/03/2015  . Abnormal EKG 08/03/2015  . Essential hypertension 08/03/2015  . Diabetes mellitus type 2 in obese (HCC) 08/03/2015  . PTSD (post-traumatic stress disorder) 08/03/2015  .  Abdominal aortic aneurysm (AAA) ([place]) 08/03/2015  . Acute pancreatitis 08/03/2015    Past Surgical History:  Procedure Laterality Date  . CHOLECYSTECTOMY    . WRIST FRACTURE SURGERY Right        Family History  Problem Relation Age of Onset  . Aneurysm Mother   . Brain cancer Father 77    Social History   Tobacco Use  . Smoking status: Current Every Day Smoker    Packs/day: 1.50    Years: 35.00     Pack years: 52.50  . Smokeless tobacco: Never Used  Substance Use Topics  . Alcohol use: No  . Drug use: No    Home Medications Prior to Admission medications   Medication Sig Start Date End Date Taking? Authorizing Provider  amLODipine (NORVASC) 10 MG tablet Take 1 tablet (10 mg total) by mouth daily. 01/09/19   Connye Burkitt, NP  aspirin EC 81 MG tablet Take 1 tablet (81 mg total) by mouth daily. 08/09/19 08/08/20  Samuella Cota, MD  busPIRone (BUSPAR) 10 MG tablet Take 10 mg by mouth at bedtime.     [provider]  divalproex (DEPAKOTE) 500 MG DR tablet Take 1,000 mg by mouth at bedtime.    [provider]  gabapentin (NEURONTIN) 300 MG capsule Take 600 mg by mouth at bedtime.     [provider]  hydrALAZINE (APRESOLINE) 10 MG tablet Take 10 mg by mouth 2 (two) times daily.    [provider]  ibuprofen (ADVIL) 200 MG tablet Take 400 mg by mouth every 6 (six) hours as needed for moderate pain.    [provider]  insulin NPH-regular Human (70-30) 100 UNIT/ML injection Inject 25 Units into the skin 2 (two) times daily with a meal.    [provider]  lisinopril (ZESTRIL) 40 MG tablet Take 40 mg by mouth daily.    [provider]  mirtazapine (REMERON) 30 MG tablet Take 1 tablet (30 mg total) by mouth at bedtime. 01/08/19   Connye Burkitt, NP  pravastatin (PRAVACHOL) 80 MG tablet Take 80 mg by mouth at bedtime.     [provider]  venlafaxine (EFFEXOR) 75 MG tablet Take 150 mg by mouth every morning.     [provider]    Allergies    Hctz [hydrochlorothiazide] and Seroquel [quetiapine fumarate]  Review of Systems   Review of Systems  Constitutional: Negative for appetite change, diaphoresis, fatigue, fever and unexpected weight change.  HENT: Negative for mouth sores.   Eyes: Negative for visual disturbance.  Respiratory: Negative for cough, chest tightness, shortness of breath and wheezing.    Cardiovascular: Positive for chest pain.  Gastrointestinal: Negative for abdominal pain, constipation, diarrhea, nausea and vomiting.  Endocrine: Positive for polydipsia. Negative for polyphagia and polyuria.  Genitourinary: Negative for dysuria, frequency, hematuria and urgency.  Musculoskeletal: Negative for back pain and neck stiffness.  Skin: Negative for rash.  Allergic/Immunologic: Negative for immunocompromised state.  Neurological: Negative for syncope, light-headedness and headaches.  Hematological: Does not bruise/bleed easily.  Psychiatric/Behavioral: Positive for suicidal ideas. Negative for sleep disturbance. The patient is not nervous/anxious.     Physical Exam Updated Vital Signs BP (!) 157/105   Pulse 90   Temp 98.4 F (36.9 C) (Oral)   Resp 18   Ht 5\' 10"  (1.778 m)   Wt 113 kg   SpO2 97%   BMI 35.74 kg/m   Physical Exam Vitals and nursing note reviewed.  Constitutional:  General: He is not in acute distress.    Appearance: He is not diaphoretic.  HENT:     Head: Normocephalic.  Eyes:     General: No scleral icterus.    Conjunctiva/sclera: Conjunctivae normal.  Cardiovascular:     Rate and Rhythm: Normal rate and regular rhythm.     Pulses: Normal pulses.          Radial pulses are 2+ on the right side and 2+ on the left side.  Pulmonary:     Effort: No tachypnea, accessory muscle usage, prolonged expiration, respiratory distress or retractions.     Breath sounds: No stridor.     Comments: Equal chest rise. No increased work of breathing. Abdominal:     General: There is no distension.     Palpations: Abdomen is soft.     Tenderness: There is no abdominal tenderness. There is no guarding or rebound.  Musculoskeletal:     Cervical back: Normal range of motion.     Right lower leg: No edema.     Left lower leg: No edema.     Comments: Moves all extremities equally and without difficulty.  Skin:    General: Skin is warm and dry.     Capillary  Refill: Capillary refill takes less than 2 seconds.  Neurological:     Mental Status: He is alert.     GCS: GCS eye subscore is 4. GCS verbal subscore is 5. GCS motor subscore is 6.     Comments: Speech is clear and goal oriented.  Psychiatric:        Attention and Perception: Attention normal.        Mood and Affect: Mood is depressed.        Speech: Speech normal.        Behavior: Behavior normal.        Thought Content: Thought content includes suicidal ideation. Thought content does not include homicidal ideation. Thought content includes suicidal plan. Thought content does not include homicidal plan.        Cognition and Memory: Cognition normal.     ED Results / Procedures / Treatments   Labs (all labs ordered are listed, but only abnormal results are displayed) Labs Reviewed  CBC - Abnormal; Notable for the following components:      Result Value   Hemoglobin 17.2 (*)    MCHC 36.4 (*)    All other components within normal limits  ACETAMINOPHEN LEVEL - Abnormal; Notable for the following components:   Acetaminophen (Tylenol), Serum <10 (*)    All other components within normal limits  SALICYLATE LEVEL - Abnormal; Notable for the following components:   Salicylate Lvl <7.0 (*)    All other components within normal limits  COMPREHENSIVE METABOLIC PANEL - Abnormal; Notable for the following components:   Sodium 128 (*)    Chloride 92 (*)    CO2 16 (*)    Glucose, Bld 639 (*)    Calcium 10.4 (*)    Total Bilirubin 1.7 (*)    Anion gap 20 (*)    All other components within normal limits  URINALYSIS, ROUTINE W REFLEX MICROSCOPIC - Abnormal; Notable for the following components:   Color, Urine STRAW (*)    Glucose, UA >=500 (*)    Ketones, ur 20 (*)    All other components within normal limits  BETA-HYDROXYBUTYRIC ACID - Abnormal; Notable for the following components:   Beta-Hydroxybutyric Acid 0.91 (*)    All other components within normal  limits  POCT I-STAT EG7 -  Abnormal; Notable for the following components:   pH, Ven 7.431 (*)    pCO2, Ven 42.6 (*)    pO2, Ven 24.0 (*)    Bicarbonate 28.4 (*)    Acid-Base Excess 3.0 (*)    Sodium 126 (*)    Potassium 5.3 (*)    All other components within normal limits  CBG MONITORING, ED - Abnormal; Notable for the following components:   Glucose-Capillary 562 (*)    All other components within normal limits  CBG MONITORING, ED - Abnormal; Notable for the following components:   Glucose-Capillary 527 (*)    All other components within normal limits  CBG MONITORING, ED - Abnormal; Notable for the following components:   Glucose-Capillary 447 (*)    All other components within normal limits  CBG MONITORING, ED - Abnormal; Notable for the following components:   Glucose-Capillary 269 (*)    All other components within normal limits  TROPONIN I (HIGH SENSITIVITY) - Abnormal; Notable for the following components:   Troponin I (High Sensitivity) 22 (*)    All other components within normal limits  TROPONIN I (HIGH SENSITIVITY) - Abnormal; Notable for the following components:   Troponin I (High Sensitivity) 23 (*)    All other components within normal limits  SARS CORONAVIRUS 2 (TAT 6-24 HRS)  RAPID URINE DRUG SCREEN, HOSP PERFORMED  BLOOD GAS, VENOUS  I-STAT VENOUS BLOOD GAS, ED    EKG EKG Interpretation  Date/Time:  Sunday August 26 2019 20:42:18 EST Ventricular Rate:  102 PR Interval:  144 QRS Duration: 108 QT Interval:  362 QTC Calculation: 471 R Axis:   -65 Text Interpretation: Sinus tachycardia Incomplete right bundle branch block Left anterior fascicular block Minimal voltage criteria for LVH, may be normal variant ( R in aVL ) Cannot rule out Anterior infarct , age undetermined Abnormal ECG Rate faster Otherwise no significant change Confirmed by Melene Plan [phone]) on 08/26/2019 9:58:34 PM   Radiology DG Chest 2 View  Result Date: 08/26/2019 CLINICAL DATA:  Chest pain intermittently for  1 month with dizziness and jaw pain as well EXAM: CHEST - 2 VIEW COMPARISON:  Radiograph 08/14/2019 FINDINGS: Few streaky atelectatic changes in the bases. No consolidation, features of edema, pneumothorax, or effusion. Pulmonary vascularity is normally distributed. The cardiomediastinal contours are unremarkable. No acute osseous or soft tissue abnormality. IMPRESSION: Atelectasis, otherwise no acute cardiopulmonary abnormality. Electronically Signed   By: Kreg Shropshire M.D.   On: 08/26/2019 21:05    Procedures .Critical Care Performed by: Dierdre Forth, PA-C Authorized by: Dierdre Forth, PA-C   Critical care provider statement:    Critical care time (minutes):  45   Critical care time was exclusive of:  Separately billable procedures and treating other patients and teaching time   Critical care was necessary to treat or prevent imminent or life-threatening deterioration of the following conditions:  Endocrine crisis   Critical care was time spent personally by me on the following activities:  Discussions with consultants, evaluation of patient's response to treatment, examination of patient, ordering and performing treatments and interventions, ordering and review of laboratory studies, ordering and review of radiographic studies, pulse oximetry, re-evaluation of patient's condition, obtaining history from patient or surrogate and review of old charts   I assumed direction of critical care for this patient from another provider in my specialty: no     (including critical care time)  Medications Ordered in ED Medications  sodium chloride flush (NS)  0.9 % injection 3 mL (3 mLs Intravenous Not Given 08/26/19 2211)  insulin regular, human (MYXREDLIN) 100 units/ 100 mL infusion ( Intravenous Rate/Dose Verify 08/27/19 0102)  0.9 %  sodium chloride infusion (has no administration in time range)  dextrose 5 %-0.45 % sodium chloride infusion (has no administration in time range)  dextrose  50 % solution 0-50 mL (has no administration in time range)  potassium chloride 10 mEq in 100 mL IVPB (10 mEq Intravenous New Bag/Given 08/27/19 0101)  aspirin chewable tablet 324 mg (324 mg Oral Given 08/26/19 2322)  sodium chloride 0.9 % bolus 500 mL (0 mLs Intravenous Stopped 08/27/19 0052)    ED Course  I have reviewed the triage vital signs and the nursing notes.  Pertinent labs & imaging results that were available during my care of the patient were reviewed by me and considered in my medical decision making (see chart for details).  Clinical Course as of Aug 26 113  Wynelle Link Aug 26, 2019  2240 Hypertensive - Hx of same  BP(!): 157/105 [HM]  2240 Hyperglycemia - evidence of DKA  Glucose(!!): 639 [HM]    Clinical Course User Index [HM] Serah Nicoletti, Boyd Kerbs   MDM Rules/Calculators/A&P HEAR Score: 5                    Pt presents with multiple complaints.  He has been having chest pain for 1 mo.  Records reviewed.  Pt was initially admitted for high risk chest pain on 08/09/2019 and discharged the same day after stable troponins with a plan for outpatient stress test at the [facility].  He was seen again for the same CP on 1/20 and cardiology did not recommend admission.  Today EKG is nonischemic however he does have slightly elevated troponins.  They are above the levels from his last visit.  Chest x-ray without infiltrate or pneumothorax.  A personal evaluated these images.  Pt also found to be in DKA today.  He reports taking his meds as directed.  Anion gap 20 with hyponatremia.  Glucose 639.  Glucose control initiated.  He will need admission for this.     Additionally, patient with suicidal ideation.  He will need psychiatric evaluation when he is medically stable.  1:16 AM Discussed patient's case with hospitalist, [place].  I have recommended admission and patient (and family if present) agree with this plan. Admitting physician will place admission orders.   The patient was discussed  with Dr. Adela Lank who agrees with the treatment plan.   Final Clinical Impression(s) / ED Diagnoses Final diagnoses:  Diabetic ketoacidosis without coma associated with type 2 diabetes mellitus (HCC)  Central chest pain  Elevated troponin  Suicidal ideations    Rx / DC Orders ED Discharge Orders    None       Tavonte Seybold, Boyd Kerbs 08/27/19 0116    Melene Plan, DO 08/27/19 1030

## 2019-08-27 ENCOUNTER — Inpatient Hospital Stay (HOSPITAL_COMMUNITY)
Admission: EM | Disposition: A | Discharge status: Home / Self Care | Payer: Self-pay | Source: Home / Self Care | Attending: Cardiothoracic Surgery

## 2019-08-27 ENCOUNTER — Encounter (HOSPITAL_COMMUNITY): Payer: Self-pay | Admitting: Internal Medicine

## 2019-08-27 ENCOUNTER — Other Ambulatory Visit: Payer: Self-pay

## 2019-08-27 ENCOUNTER — Observation Stay (HOSPITAL_BASED_OUTPATIENT_CLINIC_OR_DEPARTMENT_OTHER): Payer: No Typology Code available for payment source

## 2019-08-27 DIAGNOSIS — Z7982 Long term (current) use of aspirin: Secondary | ICD-10-CM | POA: Diagnosis not present

## 2019-08-27 DIAGNOSIS — I200 Unstable angina: Secondary | ICD-10-CM | POA: Diagnosis present

## 2019-08-27 DIAGNOSIS — M1990 Unspecified osteoarthritis, unspecified site: Secondary | ICD-10-CM | POA: Diagnosis present

## 2019-08-27 DIAGNOSIS — I208 Other forms of angina pectoris: Secondary | ICD-10-CM

## 2019-08-27 DIAGNOSIS — E781 Pure hyperglyceridemia: Secondary | ICD-10-CM | POA: Diagnosis present

## 2019-08-27 DIAGNOSIS — Z79899 Other long term (current) drug therapy: Secondary | ICD-10-CM | POA: Diagnosis not present

## 2019-08-27 DIAGNOSIS — Z808 Family history of malignant neoplasm of other organs or systems: Secondary | ICD-10-CM | POA: Diagnosis not present

## 2019-08-27 DIAGNOSIS — R9439 Abnormal result of other cardiovascular function study: Secondary | ICD-10-CM | POA: Diagnosis not present

## 2019-08-27 DIAGNOSIS — F17210 Nicotine dependence, cigarettes, uncomplicated: Secondary | ICD-10-CM | POA: Diagnosis present

## 2019-08-27 DIAGNOSIS — E1110 Type 2 diabetes mellitus with ketoacidosis without coma: Secondary | ICD-10-CM | POA: Diagnosis present

## 2019-08-27 DIAGNOSIS — E0810 Diabetes mellitus due to underlying condition with ketoacidosis without coma: Secondary | ICD-10-CM | POA: Diagnosis not present

## 2019-08-27 DIAGNOSIS — R45851 Suicidal ideations: Secondary | ICD-10-CM | POA: Diagnosis present

## 2019-08-27 DIAGNOSIS — F339 Major depressive disorder, recurrent, unspecified: Secondary | ICD-10-CM | POA: Diagnosis present

## 2019-08-27 DIAGNOSIS — R079 Chest pain, unspecified: Secondary | ICD-10-CM | POA: Diagnosis not present

## 2019-08-27 DIAGNOSIS — I25110 Atherosclerotic heart disease of native coronary artery with unstable angina pectoris: Principal | ICD-10-CM

## 2019-08-27 DIAGNOSIS — E785 Hyperlipidemia, unspecified: Secondary | ICD-10-CM | POA: Diagnosis present

## 2019-08-27 DIAGNOSIS — R778 Other specified abnormalities of plasma proteins: Secondary | ICD-10-CM | POA: Diagnosis not present

## 2019-08-27 DIAGNOSIS — E669 Obesity, unspecified: Secondary | ICD-10-CM | POA: Diagnosis present

## 2019-08-27 DIAGNOSIS — J9811 Atelectasis: Secondary | ICD-10-CM | POA: Diagnosis present

## 2019-08-27 DIAGNOSIS — D62 Acute posthemorrhagic anemia: Secondary | ICD-10-CM | POA: Diagnosis not present

## 2019-08-27 DIAGNOSIS — E871 Hypo-osmolality and hyponatremia: Secondary | ICD-10-CM | POA: Diagnosis present

## 2019-08-27 DIAGNOSIS — E1169 Type 2 diabetes mellitus with other specified complication: Secondary | ICD-10-CM | POA: Diagnosis not present

## 2019-08-27 DIAGNOSIS — E119 Type 2 diabetes mellitus without complications: Secondary | ICD-10-CM | POA: Diagnosis not present

## 2019-08-27 DIAGNOSIS — I2584 Coronary atherosclerosis due to calcified coronary lesion: Secondary | ICD-10-CM | POA: Diagnosis present

## 2019-08-27 DIAGNOSIS — F4310 Post-traumatic stress disorder, unspecified: Secondary | ICD-10-CM | POA: Diagnosis present

## 2019-08-27 DIAGNOSIS — I119 Hypertensive heart disease without heart failure: Secondary | ICD-10-CM | POA: Diagnosis present

## 2019-08-27 DIAGNOSIS — Z6835 Body mass index (BMI) 35.0-35.9, adult: Secondary | ICD-10-CM | POA: Diagnosis not present

## 2019-08-27 DIAGNOSIS — I10 Essential (primary) hypertension: Secondary | ICD-10-CM | POA: Diagnosis not present

## 2019-08-27 DIAGNOSIS — Z736 Limitation of activities due to disability: Secondary | ICD-10-CM | POA: Diagnosis not present

## 2019-08-27 DIAGNOSIS — E8881 Metabolic syndrome: Secondary | ICD-10-CM

## 2019-08-27 DIAGNOSIS — R000 Tachycardia, unspecified: Secondary | ICD-10-CM | POA: Diagnosis present

## 2019-08-27 DIAGNOSIS — Z20822 Contact with and (suspected) exposure to covid-19: Secondary | ICD-10-CM | POA: Diagnosis present

## 2019-08-27 DIAGNOSIS — I2510 Atherosclerotic heart disease of native coronary artery without angina pectoris: Secondary | ICD-10-CM | POA: Diagnosis not present

## 2019-08-27 DIAGNOSIS — Z01810 Encounter for preprocedural cardiovascular examination: Secondary | ICD-10-CM | POA: Diagnosis not present

## 2019-08-27 DIAGNOSIS — Z794 Long term (current) use of insulin: Secondary | ICD-10-CM | POA: Diagnosis not present

## 2019-08-27 DIAGNOSIS — K859 Acute pancreatitis without necrosis or infection, unspecified: Secondary | ICD-10-CM | POA: Diagnosis present

## 2019-08-27 HISTORY — PX: LEFT HEART CATH AND CORONARY ANGIOGRAPHY: CATH118249

## 2019-08-27 LAB — BASIC METABOLIC PANEL
Anion gap: 15 (ref 5–15)
Anion gap: 14 (ref 5–15)
Anion gap: 13 (ref 5–15)
Anion gap: 14 (ref 5–15)
BUN: 15 mg/dL (ref 6–20)
BUN: 16 mg/dL (ref 6–20)
BUN: 16 mg/dL (ref 6–20)
BUN: 13 mg/dL (ref 6–20)
CO2: 16 mmol/L — ABNORMAL LOW (ref 22–32)
CO2: 21 mmol/L — ABNORMAL LOW (ref 22–32)
CO2: 21 mmol/L — ABNORMAL LOW (ref 22–32)
CO2: 20 mmol/L — ABNORMAL LOW (ref 22–32)
Calcium: 9.6 mg/dL (ref 8.9–10.3)
Calcium: 9.7 mg/dL (ref 8.9–10.3)
Calcium: 9.5 mg/dL (ref 8.9–10.3)
Calcium: 8.8 mg/dL — ABNORMAL LOW (ref 8.9–10.3)
Chloride: 101 mmol/L (ref 98–111)
Chloride: 100 mmol/L (ref 98–111)
Chloride: 104 mmol/L (ref 98–111)
Chloride: 97 mmol/L — ABNORMAL LOW (ref 98–111)
Creatinine, Ser: 0.64 mg/dL (ref 0.61–1.24)
Creatinine, Ser: 0.64 mg/dL (ref 0.61–1.24)
Creatinine, Ser: 0.67 mg/dL (ref 0.61–1.24)
Creatinine, Ser: 0.44 mg/dL — ABNORMAL LOW (ref 0.61–1.24)
GFR calc Af Amer: >60 mL/min (ref >60)
GFR calc Af Amer: >60 mL/min (ref >60)
GFR calc Af Amer: >60 mL/min (ref >60)
GFR calc Af Amer: >60 mL/min (ref >60)
GFR calc non Af Amer: >60 mL/min (ref >60)
GFR calc non Af Amer: >60 mL/min (ref >60)
GFR calc non Af Amer: >60 mL/min (ref >60)
GFR calc non Af Amer: >60 mL/min (ref >60)
Glucose, Bld: 190 mg/dL — ABNORMAL HIGH (ref 70–99)
Glucose, Bld: 164 mg/dL — ABNORMAL HIGH (ref 70–99)
Glucose, Bld: 133 mg/dL — ABNORMAL HIGH (ref 70–99)
Glucose, Bld: 235 mg/dL — ABNORMAL HIGH (ref 70–99)
Potassium: 4.8 mmol/L (ref 3.5–5.1)
Potassium: 4.8 mmol/L (ref 3.5–5.1)
Potassium: 4.3 mmol/L (ref 3.5–5.1)
Potassium: 3.6 mmol/L (ref 3.5–5.1)
Sodium: 132 mmol/L — ABNORMAL LOW (ref 135–145)
Sodium: 135 mmol/L (ref 135–145)
Sodium: 138 mmol/L (ref 135–145)
Sodium: 131 mmol/L — ABNORMAL LOW (ref 135–145)

## 2019-08-27 LAB — LDL CHOLESTEROL, DIRECT

## 2019-08-27 LAB — CBG MONITORING, ED
Glucose-Capillary: 135 mg/dL — ABNORMAL HIGH (ref 70–99)
Glucose-Capillary: 137 mg/dL — ABNORMAL HIGH (ref 70–99)
Glucose-Capillary: 164 mg/dL — ABNORMAL HIGH (ref 70–99)
Glucose-Capillary: 171 mg/dL — ABNORMAL HIGH (ref 70–99)
Glucose-Capillary: 175 mg/dL — ABNORMAL HIGH (ref 70–99)
Glucose-Capillary: 178 mg/dL — ABNORMAL HIGH (ref 70–99)
Glucose-Capillary: 211 mg/dL — ABNORMAL HIGH (ref 70–99)
Glucose-Capillary: 229 mg/dL — ABNORMAL HIGH (ref 70–99)
Glucose-Capillary: 260 mg/dL — ABNORMAL HIGH (ref 70–99)
Glucose-Capillary: 269 mg/dL — ABNORMAL HIGH (ref 70–99)
Glucose-Capillary: 447 mg/dL — ABNORMAL HIGH (ref 70–99)
Glucose-Capillary: 527 mg/dL (ref 70–99)

## 2019-08-27 LAB — OSMOLALITY: Osmolality: 296 mOsm/kg — ABNORMAL HIGH (ref 275–295)

## 2019-08-27 LAB — URINALYSIS, ROUTINE W REFLEX MICROSCOPIC
Bacteria, UA: NONE SEEN
Bilirubin Urine: NEGATIVE
Glucose, UA: >=500 mg/dL — AB
Hgb urine dipstick: NEGATIVE
Ketones, ur: 20 mg/dL — AB
Leukocytes,Ua: NEGATIVE
Nitrite: NEGATIVE
Protein, ur: NEGATIVE mg/dL
Specific Gravity, Urine: 1.026 (ref 1.005–1.030)
pH: 6 (ref 5.0–8.0)

## 2019-08-27 LAB — TROPONIN I (HIGH SENSITIVITY)
Troponin I (High Sensitivity): 28 ng/L — ABNORMAL HIGH (ref <18)
Troponin I (High Sensitivity): 30 ng/L — ABNORMAL HIGH (ref <18)

## 2019-08-27 LAB — BILIRUBIN, FRACTIONATED(TOT/DIR/INDIR)
Bilirubin, Direct: 0.6 mg/dL — ABNORMAL HIGH (ref 0.0–0.2)
Indirect Bilirubin: 0.9 mg/dL (ref 0.3–0.9)
Total Bilirubin: 1.5 mg/dL — ABNORMAL HIGH (ref 0.3–1.2)

## 2019-08-27 LAB — LIPID PANEL
Cholesterol: 536 mg/dL — ABNORMAL HIGH (ref 0–200)
LDL Cholesterol: UNDETERMINED mg/dL (ref 0–99)
Triglycerides: 5619 mg/dL — ABNORMAL HIGH (ref <150)
VLDL: UNDETERMINED mg/dL (ref 0–40)

## 2019-08-27 LAB — NM MYOCAR MULTI W/SPECT W/WALL MOTION / EF
Peak HR: 90 {beats}/min
Rest HR: 75 {beats}/min

## 2019-08-27 LAB — D-DIMER, QUANTITATIVE: D-Dimer, Quant: 0.6 ug/mL-FEU — ABNORMAL HIGH (ref 0.00–0.50)

## 2019-08-27 LAB — POCT I-STAT, CHEM 8
BUN: 12 mg/dL (ref 6–20)
Calcium, Ion: 1.22 mmol/L (ref 1.15–1.40)
Chloride: 101 mmol/L (ref 98–111)
Creatinine, Ser: 0.4 mg/dL — ABNORMAL LOW (ref 0.61–1.24)
Glucose, Bld: 160 mg/dL — ABNORMAL HIGH (ref 70–99)
HCT: 42 % (ref 39.0–52.0)
Hemoglobin: 14.3 g/dL (ref 13.0–17.0)
Potassium: 3.4 mmol/L — ABNORMAL LOW (ref 3.5–5.1)
Sodium: 132 mmol/L — ABNORMAL LOW (ref 135–145)
TCO2: 24 mmol/L (ref 22–32)

## 2019-08-27 LAB — GLUCOSE, CAPILLARY
Glucose-Capillary: 94 mg/dL (ref 70–99)
Glucose-Capillary: 200 mg/dL — ABNORMAL HIGH (ref 70–99)

## 2019-08-27 LAB — SARS CORONAVIRUS 2 (TAT 6-24 HRS): SARS Coronavirus 2: NEGATIVE

## 2019-08-27 LAB — MRSA PCR SCREENING: MRSA by PCR: NEGATIVE

## 2019-08-27 LAB — MAGNESIUM: Magnesium: 2.1 mg/dL (ref 1.7–2.4)

## 2019-08-27 SURGERY — LEFT HEART CATH AND CORONARY ANGIOGRAPHY
Anesthesia: LOCAL

## 2019-08-27 MED ORDER — MIDAZOLAM HCL 2 MG/2ML IJ SOLN
INTRAMUSCULAR | Status: DC | PRN
  Administered 2019-08-27: 2 mg via INTRAVENOUS

## 2019-08-27 MED ORDER — REGADENOSON 0.4 MG/5ML IV SOLN
0.4000 mg | Freq: Once | INTRAVENOUS | Status: AC
  Administered 2019-08-27: 11:00:00 0.4 mg via INTRAVENOUS
  Filled 2019-08-27: qty 5

## 2019-08-27 MED ORDER — MIDAZOLAM HCL 2 MG/2ML IJ SOLN
INTRAMUSCULAR | Status: AC
  Filled 2019-08-27: qty 2

## 2019-08-27 MED ORDER — SODIUM CHLORIDE 0.9 % IV SOLN: INTRAVENOUS | Status: DC

## 2019-08-27 MED ORDER — ONDANSETRON HCL 4 MG/2ML IJ SOLN: 4.0000 mg | Freq: Four times a day (QID) | INTRAMUSCULAR | Status: DC | PRN

## 2019-08-27 MED ORDER — SODIUM CHLORIDE 0.9 % IV BOLUS: 1000.0000 mL | Freq: Once | INTRAVENOUS | Status: DC

## 2019-08-27 MED ORDER — TECHNETIUM TC 99M TETROFOSMIN IV KIT
31.1000 | PACK | Freq: Once | INTRAVENOUS | Status: AC | PRN
  Administered 2019-08-27: 31.1 via INTRAVENOUS

## 2019-08-27 MED ORDER — LIDOCAINE HCL (PF) 1 % IJ SOLN
INTRAMUSCULAR | Status: AC
  Filled 2019-08-27: qty 30

## 2019-08-27 MED ORDER — VERAPAMIL HCL 2.5 MG/ML IV SOLN
INTRAVENOUS | Status: AC
  Filled 2019-08-27: qty 2

## 2019-08-27 MED ORDER — SODIUM CHLORIDE 0.9% FLUSH
3.0000 mL | Freq: Two times a day (BID) | INTRAVENOUS | Status: DC
  Administered 2019-08-28: 21:00:00 3 mL via INTRAVENOUS

## 2019-08-27 MED ORDER — ACETAMINOPHEN 325 MG PO TABS
650.0000 mg | ORAL_TABLET | ORAL | Status: DC | PRN
  Administered 2019-08-27 – 2019-08-29 (×5): 650 mg via ORAL
  Filled 2019-08-27 (×5): qty 2

## 2019-08-27 MED ORDER — ENOXAPARIN SODIUM 40 MG/0.4ML ~~LOC~~ SOLN
40.0000 mg | Freq: Every day | SUBCUTANEOUS | Status: DC
  Administered 2019-08-27: 12:00:00 40 mg via SUBCUTANEOUS
  Filled 2019-08-27: qty 0.4

## 2019-08-27 MED ORDER — ASPIRIN 81 MG PO CHEW
81.0000 mg | CHEWABLE_TABLET | Freq: Every day | ORAL | Status: DC
  Administered 2019-08-28 – 2019-08-29 (×2): 81 mg via ORAL
  Filled 2019-08-27 (×2): qty 1

## 2019-08-27 MED ORDER — NITROGLYCERIN IN D5W 200-5 MCG/ML-% IV SOLN
0.0000 ug/min | INTRAVENOUS | Status: DC
  Administered 2019-08-29: 20 ug/min via INTRAVENOUS
  Filled 2019-08-27 (×2): qty 250

## 2019-08-27 MED ORDER — FENTANYL CITRATE (PF) 100 MCG/2ML IJ SOLN
INTRAMUSCULAR | Status: DC | PRN
  Administered 2019-08-27: 50 ug via INTRAVENOUS

## 2019-08-27 MED ORDER — TECHNETIUM TC 99M TETROFOSMIN IV KIT
10.0000 | PACK | Freq: Once | INTRAVENOUS | Status: AC | PRN
  Administered 2019-08-27: 10 via INTRAVENOUS

## 2019-08-27 MED ORDER — HEPARIN (PORCINE) 25000 UT/250ML-% IV SOLN
1800.0000 [IU]/h | INTRAVENOUS | Status: DC
  Administered 2019-08-28: 1000 [IU]/h via INTRAVENOUS
  Administered 2019-08-29 – 2019-08-30 (×2): 1800 [IU]/h via INTRAVENOUS
  Filled 2019-08-27 (×4): qty 250

## 2019-08-27 MED ORDER — HEPARIN (PORCINE) IN NACL 1000-0.9 UT/500ML-% IV SOLN
INTRAVENOUS | Status: AC
  Filled 2019-08-27: qty 1000

## 2019-08-27 MED ORDER — FENTANYL CITRATE (PF) 100 MCG/2ML IJ SOLN
INTRAMUSCULAR | Status: AC
  Filled 2019-08-27: qty 2

## 2019-08-27 MED ORDER — SODIUM CHLORIDE 0.9 % IV SOLN
250.0000 mL | INTRAVENOUS | Status: DC | PRN
  Administered 2019-08-29: 250 mL via INTRAVENOUS

## 2019-08-27 MED ORDER — HEPARIN SODIUM (PORCINE) 1000 UNIT/ML IJ SOLN
INTRAMUSCULAR | Status: DC | PRN
  Administered 2019-08-27: 5500 [IU] via INTRAVENOUS

## 2019-08-27 MED ORDER — HEPARIN (PORCINE) IN NACL 1000-0.9 UT/500ML-% IV SOLN
INTRAVENOUS | Status: DC | PRN
  Administered 2019-08-27 (×2): 500 mL

## 2019-08-27 MED ORDER — HYDRALAZINE HCL 20 MG/ML IJ SOLN: 10.0000 mg | INTRAMUSCULAR | Status: AC | PRN

## 2019-08-27 MED ORDER — SODIUM CHLORIDE 0.9% FLUSH: 3.0000 mL | INTRAVENOUS | Status: DC | PRN

## 2019-08-27 MED ORDER — NITROGLYCERIN IN D5W 200-5 MCG/ML-% IV SOLN
INTRAVENOUS | Status: AC | PRN
  Administered 2019-08-27: 10 ug/min via INTRAVENOUS

## 2019-08-27 MED ORDER — NITROGLYCERIN IN D5W 200-5 MCG/ML-% IV SOLN
INTRAVENOUS | Status: AC
  Filled 2019-08-27: qty 250

## 2019-08-27 MED ORDER — LABETALOL HCL 5 MG/ML IV SOLN: 10.0000 mg | INTRAVENOUS | Status: AC | PRN

## 2019-08-27 MED ORDER — IOHEXOL 350 MG/ML SOLN
INTRAVENOUS | Status: DC | PRN
  Administered 2019-08-27: 18:00:00 90 mL

## 2019-08-27 MED ORDER — METOPROLOL TARTRATE 25 MG PO TABS
25.0000 mg | ORAL_TABLET | Freq: Two times a day (BID) | ORAL | Status: DC
  Administered 2019-08-27 – 2019-08-29 (×5): 25 mg via ORAL
  Filled 2019-08-27 (×5): qty 1

## 2019-08-27 MED ORDER — HEPARIN SODIUM (PORCINE) 1000 UNIT/ML IJ SOLN
INTRAMUSCULAR | Status: AC
  Filled 2019-08-27: qty 1

## 2019-08-27 MED ORDER — NICOTINE 21 MG/24HR TD PT24
21.0000 mg | MEDICATED_PATCH | Freq: Every day | TRANSDERMAL | Status: DC
  Administered 2019-08-28 – 2019-08-29 (×2): 21 mg via TRANSDERMAL
  Filled 2019-08-27 (×2): qty 1

## 2019-08-27 MED ORDER — LIDOCAINE HCL (PF) 1 % IJ SOLN
INTRAMUSCULAR | Status: DC | PRN
  Administered 2019-08-27: 2 mL via INTRADERMAL

## 2019-08-27 MED ORDER — DIAZEPAM 5 MG PO TABS
5.0000 mg | ORAL_TABLET | Freq: Four times a day (QID) | ORAL | Status: DC | PRN
  Administered 2019-08-27 – 2019-08-29 (×4): 5 mg via ORAL
  Filled 2019-08-27 (×4): qty 1

## 2019-08-27 MED ORDER — SODIUM CHLORIDE 0.9 % IV SOLN: 250.0000 mL | INTRAVENOUS | Status: DC | PRN

## 2019-08-27 MED ORDER — REGADENOSON 0.4 MG/5ML IV SOLN
INTRAVENOUS | Status: AC
  Filled 2019-08-27: qty 5

## 2019-08-27 MED ORDER — NITROGLYCERIN 0.4 MG SL SUBL
0.4000 mg | SUBLINGUAL_TABLET | SUBLINGUAL | Status: DC | PRN
  Administered 2019-08-27 (×3): 0.4 mg via SUBLINGUAL
  Filled 2019-08-27: qty 1

## 2019-08-27 MED ORDER — VERAPAMIL HCL 2.5 MG/ML IV SOLN
INTRAVENOUS | Status: DC | PRN
  Administered 2019-08-27: 10 mL via INTRA_ARTERIAL

## 2019-08-27 MED ORDER — ASPIRIN 81 MG PO CHEW
81.0000 mg | CHEWABLE_TABLET | ORAL | Status: AC
  Administered 2019-08-27: 81 mg via ORAL
  Filled 2019-08-27: qty 1

## 2019-08-27 MED ORDER — SODIUM CHLORIDE 0.9% FLUSH
3.0000 mL | Freq: Two times a day (BID) | INTRAVENOUS | Status: DC
  Administered 2019-08-27 – 2019-08-28 (×2): 3 mL via INTRAVENOUS

## 2019-08-27 SURGICAL SUPPLY — 12 items
CATH INFINITI 5 FR JL3.5 (CATHETERS) ×1 IMPLANT
CATH INFINITI 5FR JL4 (CATHETERS) ×1 IMPLANT
CATH INFINITI JR4 5F (CATHETERS) ×1 IMPLANT
CATH OPTITORQUE TIG 4.0 5F (CATHETERS) ×1 IMPLANT
DEVICE RAD COMP TR BAND LRG (VASCULAR PRODUCTS) ×1 IMPLANT
GLIDESHEATH SLEND SS 6F .021 (SHEATH) ×1 IMPLANT
GUIDEWIRE INQWIRE 1.5J.035X260 (WIRE) IMPLANT
INQWIRE 1.5J .035X260CM (WIRE) ×2
KIT HEART LEFT (KITS) ×2 IMPLANT
PACK CARDIAC CATHETERIZATION (CUSTOM PROCEDURE TRAY) ×2 IMPLANT
TRANSDUCER W/STOPCOCK (MISCELLANEOUS) ×2 IMPLANT
TUBING CIL FLEX 10 FLL-RA (TUBING) ×2 IMPLANT

## 2019-08-27 NOTE — ED Notes (Signed)
Patient transferred to [facility] for stress test

## 2019-08-27 NOTE — Progress Notes (Signed)
Patient ID: Jacob Brady, male   DOB: [birth year], 59 y.o.   MRN: [id number] Patient was admitted early this morning for chest pain and suicidal ideation.  He was found to be in DKA and started on insulin drip.  Cardiology and psychiatry consultation has been requested.  I have reviewed patient medical records including this morning's H&P, current vitals, labs, medications.  Currently on insulin drip.  Patient seen and examined at bedside.  Plan of care discussed with him.  Currently still having intermittent chest pain.  Triglycerides 5619 and cholesterol of 536.  We will continue insulin drip for now and repeat triglycerides in a.m.  Diabetes coordinator consult.  Repeat a.m. labs.  Patient will need at least 1 or 2 more midnight stay in the hospital for better triglyceride control and also to continue cardiac work-up and follow psychiatry recommendations.  Will move him to inpatient.

## 2019-08-27 NOTE — H&P (View-Only) (Signed)
Cardiology Consultation:   Patient ID: Jacob Brady MRN: [id number]; DOB: 12-23-1960  Admit date: 08/26/2019 Date of Consult: 08/27/2019  Primary Care Provider: [facility] Primary Cardiologist: No primary care provider on file.  Primary Electrophysiologist:  None    Patient Profile:   Jacob Brady is a 59 y.o. male with a hx of poorly controlled insulin-dependent diabetes, hypertension, hyperlipidemia, major depressive disorder, PTSD, pancreatitis who is being seen today for the evaluation of chest pain at the request of Dr. Hanley Ben.  History of Present Illness:   Jacob Brady has a past medical history as above and is followed at the [facility] in [place].  He presented to the hospital today with complaints of chest pain and suicidal ideation.  The patient was recently seen for chest pain on 08/09/2019 and noted to have stable troponins and nonacute EKG.  Apparently he was offered cardiology evaluation at that time but the patient preferred to follow-up with the [facility] in [place] for stress testing.  He tells me that he was not able to make it there due to transportation issues. Pt denies any prior cardiac issues or testing.   Today the patient tells me that he began having chest pain in late December.  His chest pain occurs every day and often wakes him from sleep at night.  He can have chest pain when he is smoking.  His chest pain is in his central chest and slightly to the right with associated right jaw pain and mild right arm pain.  The severity can range from 4-5/10- to 9/10.  He does not have associated shortness of breath, nausea, lightheadedness or diaphoresis.  He notes that he has no motor transportation so he has to walk for transportation.  He walks to the store ~1-1.5 miles about twice a week.  He notes that he carries a lot of groceries using a backpack and duffel bag.  He also has to walk up 1 flight of stairs to his apartment.  He notes no significant chest  discomfort with walking or climbing stairs.  He does have shortness of breath when he walks fast or up stairs but otherwise he feels that he tolerates the walks to the store well. He denies orthopnea, PND or edema.   The patient is a longtime smoker, 1.5 packs/day.  He denies any alcohol or illicit drug use.  He has no significant family history of CAD.  In the ED the patient had low and flat high-sensitivity troponins, 22>23>28>30.  EKG was without acute ischemic changes.  Blood glucose was elevated at 639.  COVID-19 testing was negative. Lipid panel showed a total cholesterol level of 536 with triglycerides of 5619.  D-dimer was 0.60.  Chest x-ray showed atelectasis, no acute cardiopulmonary abnormality.  The patient was admitted to the medical service and is being treated for DKA in the setting of known poorly controlled insulin-dependent diabetes.  A1c was 15.5 on 08/05/2019.  The patient received a fluid bolus in the ED and is now continuing on maintenance IV fluids and insulin drip.  Heart Pathway Score:  HEAR Score: 5  Past Medical History:  Diagnosis Date  . Arthritis   . Diabetes mellitus without complication (HCC)   . Hypertension   . Pancreatitis   . PTSD (post-traumatic stress disorder)     Past Surgical History:  Procedure Laterality Date  . CHOLECYSTECTOMY    . WRIST FRACTURE SURGERY Right      Home Medications:  Prior to Admission medications  Medication Sig Start Date End Date Taking? Authorizing Provider  amLODipine (NORVASC) 10 MG tablet Take 1 tablet (10 mg total) by mouth daily. 01/09/19  Yes Aldean Baker, NP  aspirin EC 81 MG tablet Take 1 tablet (81 mg total) by mouth daily. 08/09/19 08/08/20 Yes Standley Brooking, MD  busPIRone (BUSPAR) 10 MG tablet Take 10 mg by mouth at bedtime.    Yes [provider]  divalproex (DEPAKOTE) 500 MG DR tablet Take 1,000 mg by mouth at bedtime.   Yes [provider]  gabapentin (NEURONTIN) 300 MG capsule Take  600 mg by mouth at bedtime.    Yes [provider]  hydrALAZINE (APRESOLINE) 10 MG tablet Take 10 mg by mouth 2 (two) times daily.   Yes [provider]  ibuprofen (ADVIL) 200 MG tablet Take 400 mg by mouth every 6 (six) hours as needed for moderate pain.   Yes [provider]  insulin NPH-regular Human (70-30) 100 UNIT/ML injection Inject 25 Units into the skin 2 (two) times daily with a meal.   Yes [provider]  lisinopril (ZESTRIL) 40 MG tablet Take 40 mg by mouth daily.   Yes [provider]  mirtazapine (REMERON) 30 MG tablet Take 1 tablet (30 mg total) by mouth at bedtime. 01/08/19  Yes Aldean Baker, NP  pravastatin (PRAVACHOL) 80 MG tablet Take 80 mg by mouth at bedtime.    Yes [provider]  venlafaxine (EFFEXOR) 75 MG tablet Take 150 mg by mouth every morning.    Yes [provider]    Inpatient Medications: Scheduled Meds: . enoxaparin (LOVENOX) injection  40 mg Subcutaneous Daily  . nicotine  21 mg Transdermal Daily  . regadenoson      . sodium chloride flush  3 mL Intravenous Once   Continuous Infusions: . sodium chloride    . dextrose 5 % and 0.45% NaCl 75 mL/hr at 08/27/19 0239  . insulin 1.2 Units/hr (08/27/19 0838)   PRN Meds: dextrose, nitroGLYCERIN  Allergies:    Allergies  Allergen Reactions  . Hctz [Hydrochlorothiazide] Other (See Comments)    Unknown rxn, "they just told me not to take it anymore"  . Seroquel [Quetiapine Fumarate] Other (See Comments)    Elevates liver enzymes     Social History:   Social History   Socioeconomic History  . Marital status: Divorced    Spouse name: Not on file  . Number of children: Not on file  . Years of education: Not on file  . Highest education level: Not on file  Occupational History  . Occupation: disability  Tobacco Use  . Smoking status: Current Every Day Smoker    Packs/day: 1.50    Years: 35.00    Pack years: 52.50  . Smokeless  tobacco: Never Used  Substance and Sexual Activity  . Alcohol use: No  . Drug use: No  . Sexual activity: Not Currently  Other Topics Concern  . Not on file  Social History Narrative  . Not on file   Social Determinants of Health   Financial Resource Strain:   . Difficulty of Paying Living Expenses: Not on file  Food Insecurity:   . Worried About Programme researcher, broadcasting/film/video in the Last Year: Not on file  . Ran Out of Food in the Last Year: Not on file  Transportation Needs:   . Lack of Transportation (Medical): Not on file  . Lack of Transportation (Non-Medical): Not on file  Physical Activity:   .  Days of Exercise per Week: Not on file  . Minutes of Exercise per Session: Not on file  Stress:   . Feeling of Stress : Not on file  Social Connections:   . Frequency of Communication with Friends and Family: Not on file  . Frequency of Social Gatherings with Friends and Family: Not on file  . Attends Religious Services: Not on file  . Active Member of Clubs or Organizations: Not on file  . Attends Banker Meetings: Not on file  . Marital Status: Not on file  Intimate Partner Violence:   . Fear of Current or Ex-Partner: Not on file  . Emotionally Abused: Not on file  . Physically Abused: Not on file  . Sexually Abused: Not on file    Family History:    Family History  Problem Relation Age of Onset  . Aneurysm Mother        Brain  . Brain cancer Father 55  . Healthy Sister   . Healthy Brother   . Healthy Sister   . Healthy Sister      ROS:  Please see the history of present illness.   All other ROS reviewed and negative.     Physical Exam/Data:   Vitals:   08/27/19 1104 08/27/19 1106 08/27/19 1107 08/27/19 1109  BP: (!) 157/96 (!) 168/95 (!) 171/99 (!) 175/103  Pulse:      Resp:      Temp:      TempSrc:      SpO2:      Weight:      Height:        Intake/Output Summary (Last 24 hours) at 08/27/2019 1136 Last data filed at 08/27/2019 0102 Gross per 24  hour  Intake 17.73 ml  Output --  Net 17.73 ml   Last 3 Weights 08/26/2019 07/23/2019 01/03/2019  Weight (lbs) 249 lb 1.9 oz 250 lb 275 lb  Weight (kg) 113 kg 113.399 kg 124.739 kg  Some encounter information is confidential and restricted. Go to Review Flowsheets activity to see all data.     Body mass index is 35.74 kg/m.  General:  Well nourished, well developed, in no acute distress HEENT: normal Lymph: no adenopathy Neck: no JVD Endocrine:  No thryomegaly Vascular: No carotid bruits; FA pulses 2+ bilaterally without bruits  Cardiac:  normal S1, S2; RRR; no murmur  Lungs:  clear to auscultation bilaterally, no wheezing, rhonchi or rales  Abd: soft, nontender, no hepatomegaly  Ext: no edema Musculoskeletal:  No deformities, BUE and BLE strength normal and equal Skin: warm and dry  Neuro:  CNs 2-12 intact, no focal abnormalities noted Psych:  Normal affect   EKG:  The EKG was personally reviewed and demonstrates:  Sinus rhythm, 69 bpm, LAD, consider left anterior fascicular block Abnormal R-wave progression, early transition. Very mild TWI in V1-V3 Telemetry:  NSR  Relevant CV Studies:  Myocardial perfusion study in process  Laboratory Data:  High Sensitivity Troponin:   Recent Labs  Lab 08/15/19 0003 08/26/19 2044 08/26/19 2228 08/27/19 0203 08/27/19 0349  TROPONINIHS 14 22* 23* 28* 30*     Chemistry Recent Labs  Lab 08/27/19 0203 08/27/19 0349 08/27/19 0813  NA 132* 135 138  K 4.8 4.8 4.3  CL 101 100 104  CO2 16* 21* 21*  GLUCOSE 190* 164* 133*  BUN 15 16 16   CREATININE 0.64 0.64 0.67  CALCIUM 9.6 9.7 9.5  GFRNONAA >60 >60 >60  GFRAA >60 >60 >60  ANIONGAP  15 14 13     Recent Labs  Lab 08/26/19 2044 08/27/19 0349  PROT 7.5  --   ALBUMIN 3.9  --   AST 25  --   ALT 23  --   ALKPHOS 70  --   BILITOT 1.7* 1.5*   Hematology Recent Labs  Lab 08/26/19 2044 08/26/19 2233  WBC 9.8  --   RBC 5.22  --   HGB 17.2* 16.7  HCT 47.2 49.0  MCV 90.4   --   MCH 33.0  --   MCHC 36.4*  --   RDW 13.1  --   PLT 254  --    BNPNo results for input(s): BNP, PROBNP in the last 168 hours.  DDimer  Recent Labs  Lab 08/27/19 0349  DDIMER 0.60*     Radiology/Studies:  DG Chest 2 View  Result Date: 08/26/2019 CLINICAL DATA:  Chest pain intermittently for 1 month with dizziness and jaw pain as well EXAM: CHEST - 2 VIEW COMPARISON:  Radiograph 08/14/2019 FINDINGS: Few streaky atelectatic changes in the bases. No consolidation, features of edema, pneumothorax, or effusion. Pulmonary vascularity is normally distributed. The cardiomediastinal contours are unremarkable. No acute osseous or soft tissue abnormality. IMPRESSION: Atelectasis, otherwise no acute cardiopulmonary abnormality. Electronically Signed   By: Kreg Shropshire M.D.   On: 08/26/2019 21:05       HEAR Score (for undifferentiated chest pain):  HEAR Score: 5    Assessment and Plan:   Chest pain -No prior cardiac history. -Patient with daily nonexertional chest pain since late December.  Chest discomfort is central chest to slight right with associated jaw pain and mild right arm pain.  Patient has mild dyspnea on exertion with fast walking or walking up stairs, possibly related to longtime smoking history. -CVD risk factors include hypertension, hyperlipidemia, poorly controlled diabetes, smoking, obesity. -High-sensitivity troponins mildly elevated and flat pattern 22>23>28>30. -EKG nonischemic, mild nonspecific T wave inversions in V1-V3.  Abnormal R wave progression, early transition. -Patient has significant risk factors and very likely has some degree of CAD. -Patient is currently undergoing a nuclear stress test. -Dr. Herbie Baltimore will evaluate patient once results of stress test obtained.  Hypertension -Home medications include amlodipine 10 mg, hydralazine 10 mg twice daily, lisinopril 40 mg  Hyperlipidemia -Patient is on pravastatin 80 mg daily -Triglycerides are significantly  elevated, >5000.  Patient has a past medical history of pancreatitis.  Diabetes on insulin/DKA  -Poorly controlled.  A1c 15.5 on 08/05/2019.  Had been 12.7 in 01/2019. -Current treatment for DKA by primary team, IV fluids and insulin drip.  Tobacco abuse -1.5 pack/day smoker -Patient advised on cessation to reduce his future cardiac risk.     Signed, Berton Bon, NP  08/27/2019 11:36 AM  ATTENDING ATTESTATION  I have seen, examined and evaluated the patient this PM along with Ms. Jeraldine Loots, NP.  After reviewing all the available data and chart, we discussed the patients laboratory, study & physical findings as well as symptoms in detail. I agree with her findings, examination as well as impression& recommendations.  I am seeing him after the results of his stress test show INTERMEDIATE RISK study with EF of 45 to 50% with partially reversible inferolateral defect consistent with prior infarct with peri-infarct ischemia in what looks like you the left circumflex or right posterolateral distribution.  The patient does not have any recollection of prior infarct, this would be suggestive of a new finding of ischemic CAD.  Given that he has had at least  1 month of ongoing intermittent chest pain, currently having chest pain here today requiring nitroglycerin, is reasonable to assess with cardiac catheterization as this is potentially an anginal equivalent.  Interestingly, with chest pain did not occur with exertion until today.  That would be somewhat unusual for a fixed ischemic CAD lesion, however the stress test does suggest prior infarct with peri-infarct ischemia and mild reduced EF.  The chest pain is nitroglycerin responsive suggesting possible scar.  He has significant risk factors of insulin-dependent diabetes, hypertriglyceridemia, obesity and hypertension all consistent with metabolic syndrome.  He will definitely need management of hypertension and hyperlipidemia as his ongoing risk  factors as well as diabetes.  Would probably consider further titration of hydralazine.   Beta-blocker if heart rate tolerates.    Plan: We will schedule for left heart catheterization possible PCI today. Further plans based on results.   Performing MD:  Bryan Lemma, M.D., M.S.  Procedure: Left Heart Catheterization Coronary Angiography and Possible Percutaneous, Intervention  The procedure with Risks/Benefits/Alternatives and Indications was reviewed with the patient.  All questions were answered.    Risks / Complications include, but not limited to: Death, MI, CVA/TIA, VF/VT (with defibrillation), Bradycardia (need for temporary pacer placement), contrast induced nephropathy, bleeding / bruising / hematoma / pseudoaneurysm, vascular or coronary injury (with possible emergent CT or Vascular Surgery), adverse medication reactions, infection.  Additional risks involving the use of radiation with the possibility of radiation burns and cancer were explained in detail.  The patient voices understanding and agree to proceed.      Bryan Lemma, M.D., M.S. Interventional Cardiologist   Pager # [phone] Phone # [phone] [address] [place], [place] [address]    For questions or updates, please contact [facility] Please consult [email or web address] for contact info under

## 2019-08-27 NOTE — Progress Notes (Signed)
Inpatient Diabetes Program Recommendations  AACE/ADA: New Consensus Statement on Inpatient Glycemic Control (2015)  Target Ranges:  Prepandial:   less than 140 mg/dL      Peak postprandial:   less than 180 mg/dL (1-2 hours)      Critically ill patients:  140 - 180 mg/dL   Lab Results  Component Value Date   GLUCAP 137 (H) 08/27/2019   HGBA1C 15.5 (H) 08/05/2019    Review of Glycemic Control Results for Jacob Brady, Jacob Brady (MRN [id number]) as of 08/27/2019 10:20  Ref. Range 08/27/2019 04:57 08/27/2019 06:18 08/27/2019 06:53 08/27/2019 08:36  Glucose-Capillary Latest Ref Range: 70 - 99 mg/dL 144 (H) 315 (H) 400 (H) 137 (H)   Diabetes history: Type 2 DM Outpatient Diabetes medications: Novolog 70/30 25 units BID Current orders for Inpatient glycemic control: IV insulin  Inpatient Diabetes Program Recommendations:    Noted consult for DM. Patient has sitter for suicidal ideation. Will follow back up with patient once more appropriate and cleared from psychiatry.  Note triglycerides>5000. Will likely need IV insulin until triglygerides improved.   Thanks, Lujean Rave, MSN, RNC-OB Diabetes Coordinator [phone] (8a-5p)

## 2019-08-27 NOTE — Progress Notes (Signed)
ANTICOAGULATION CONSULT NOTE - Initial Consult  Pharmacy Consult for IV Heparin Indication: chest pain/ACS  Allergies  Allergen Reactions  . Hctz [Hydrochlorothiazide] Other (See Comments)    Unknown rxn, "they just told me not to take it anymore"  . Seroquel [Quetiapine Fumarate] Other (See Comments)    Elevates liver enzymes     Patient Measurements: Height: 5\' 10"  (177.8 cm) Weight: 249 lb 1.9 oz (113 kg) IBW/kg (Calculated) : 73 Heparin Dosing Weight: 97.8 kg  Vital Signs: Temp: 98.3 F (36.8 C) (02/01 1754) Temp Source: Oral (02/01 1754) BP: 113/84 (02/01 1900) Pulse Rate: 82 (02/01 1900)  Labs: Recent Labs    08/26/19 2044 08/26/19 2044 08/26/19 2228 08/26/19 2233 08/27/19 0203 08/27/19 0203 08/27/19 0349 08/27/19 0349 08/27/19 0813 08/27/19 1321 08/27/19 1729  HGB 17.2*   < >  --  16.7  --   --   --   --   --   --  14.3  HCT 47.2  --   --  49.0  --   --   --   --   --   --  42.0  PLT 254  --   --   --   --   --   --   --   --   --   --   CREATININE 1.01   < >  --   --  0.64   < > 0.64   < > 0.67 0.44* 0.40*  TROPONINIHS 22*   < > 23*  --  28*  --  30*  --   --   --   --    < > = values in this interval not displayed.    Estimated Creatinine Clearance: 129.8 mL/min (A) (by C-G formula based on SCr of 0.4 mg/dL (L)).   Medical History: Past Medical History:  Diagnosis Date  . Arthritis   . Diabetes mellitus without complication (HCC)   . Hypertension   . Pancreatitis   . 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 Patient had radial sheath that was removed at 17:24PM and TR band placed. RN working to remove air -so far without issues. No bleeding or hematoma noted.   Goal of Therapy:  Heparin level 0.3-0.7 units/ml Monitor platelets by anticoagulation protocol: Yes    Plan:   At 0130 AM, start IV Heparin at 1000 units/hr.  Will check Heparin level 6 hours after start of the infusion.  Monitor daily Heparin level and CBC while on therapy.  Monitor for signs and symptoms of bleeding.    61, PharmD, BCPS, BCCCP Clinical Pharmacist Please refer to [facility] for [facility] numbers 08/27/2019,7:22 PM

## 2019-08-27 NOTE — Progress Notes (Signed)
Per d/w Dr. Herbie Baltimore, he recommends cath - he spoke with patient, note forthcoming. He discussed risks/benefits. Orders written per our discussion. Patient already receiving IVF as part of DKA rx. Also notified IM who is managing his DM treatment. Jacob Gonyea PA-C

## 2019-08-27 NOTE — Consult Note (Addendum)
Cardiology Consultation:   Patient ID: Jacob Brady MRN: [id number]; DOB: 12-23-1960  Admit date: 08/26/2019 Date of Consult: 08/27/2019  Primary Care Provider: [facility] Primary Cardiologist: No primary care provider on file.  Primary Electrophysiologist:  None    Patient Profile:   Jacob Brady is a 59 y.o. male with a hx of poorly controlled insulin-dependent diabetes, hypertension, hyperlipidemia, major depressive disorder, PTSD, pancreatitis who is being seen today for the evaluation of chest pain at the request of Dr. Hanley Ben.  History of Present Illness:   Mr. Jacob Brady has a past medical history as above and is followed at the [facility] in [place].  He presented to the hospital today with complaints of chest pain and suicidal ideation.  The patient was recently seen for chest pain on 08/09/2019 and noted to have stable troponins and nonacute EKG.  Apparently he was offered cardiology evaluation at that time but the patient preferred to follow-up with the [facility] in [place] for stress testing.  He tells me that he was not able to make it there due to transportation issues. Pt denies any prior cardiac issues or testing.   Today the patient tells me that he began having chest pain in late December.  His chest pain occurs every day and often wakes him from sleep at night.  He can have chest pain when he is smoking.  His chest pain is in his central chest and slightly to the right with associated right jaw pain and mild right arm pain.  The severity can range from 4-5/10- to 9/10.  He does not have associated shortness of breath, nausea, lightheadedness or diaphoresis.  He notes that he has no motor transportation so he has to walk for transportation.  He walks to the store ~1-1.5 miles about twice a week.  He notes that he carries a lot of groceries using a backpack and duffel bag.  He also has to walk up 1 flight of stairs to his apartment.  He notes no significant chest  discomfort with walking or climbing stairs.  He does have shortness of breath when he walks fast or up stairs but otherwise he feels that he tolerates the walks to the store well. He denies orthopnea, PND or edema.   The patient is a longtime smoker, 1.5 packs/day.  He denies any alcohol or illicit drug use.  He has no significant family history of CAD.  In the ED the patient had low and flat high-sensitivity troponins, 22>23>28>30.  EKG was without acute ischemic changes.  Blood glucose was elevated at 639.  COVID-19 testing was negative. Lipid panel showed a total cholesterol level of 536 with triglycerides of 5619.  D-dimer was 0.60.  Chest x-ray showed atelectasis, no acute cardiopulmonary abnormality.  The patient was admitted to the medical service and is being treated for DKA in the setting of known poorly controlled insulin-dependent diabetes.  A1c was 15.5 on 08/05/2019.  The patient received a fluid bolus in the ED and is now continuing on maintenance IV fluids and insulin drip.  Heart Pathway Score:  HEAR Score: 5  Past Medical History:  Diagnosis Date  . Arthritis   . Diabetes mellitus without complication (HCC)   . Hypertension   . Pancreatitis   . PTSD (post-traumatic stress disorder)     Past Surgical History:  Procedure Laterality Date  . CHOLECYSTECTOMY    . WRIST FRACTURE SURGERY Right      Home Medications:  Prior to Admission medications  Medication Sig Start Date End Date Taking? Authorizing Provider  amLODipine (NORVASC) 10 MG tablet Take 1 tablet (10 mg total) by mouth daily. 01/09/19  Yes Aldean Baker, NP  aspirin EC 81 MG tablet Take 1 tablet (81 mg total) by mouth daily. 08/09/19 08/08/20 Yes Standley Brooking, MD  busPIRone (BUSPAR) 10 MG tablet Take 10 mg by mouth at bedtime.    Yes [provider]  divalproex (DEPAKOTE) 500 MG DR tablet Take 1,000 mg by mouth at bedtime.   Yes [provider]  gabapentin (NEURONTIN) 300 MG capsule Take  600 mg by mouth at bedtime.    Yes [provider]  hydrALAZINE (APRESOLINE) 10 MG tablet Take 10 mg by mouth 2 (two) times daily.   Yes [provider]  ibuprofen (ADVIL) 200 MG tablet Take 400 mg by mouth every 6 (six) hours as needed for moderate pain.   Yes [provider]  insulin NPH-regular Human (70-30) 100 UNIT/ML injection Inject 25 Units into the skin 2 (two) times daily with a meal.   Yes [provider]  lisinopril (ZESTRIL) 40 MG tablet Take 40 mg by mouth daily.   Yes [provider]  mirtazapine (REMERON) 30 MG tablet Take 1 tablet (30 mg total) by mouth at bedtime. 01/08/19  Yes Aldean Baker, NP  pravastatin (PRAVACHOL) 80 MG tablet Take 80 mg by mouth at bedtime.    Yes [provider]  venlafaxine (EFFEXOR) 75 MG tablet Take 150 mg by mouth every morning.    Yes [provider]    Inpatient Medications: Scheduled Meds: . enoxaparin (LOVENOX) injection  40 mg Subcutaneous Daily  . nicotine  21 mg Transdermal Daily  . regadenoson      . sodium chloride flush  3 mL Intravenous Once   Continuous Infusions: . sodium chloride    . dextrose 5 % and 0.45% NaCl 75 mL/hr at 08/27/19 0239  . insulin 1.2 Units/hr (08/27/19 0838)   PRN Meds: dextrose, nitroGLYCERIN  Allergies:    Allergies  Allergen Reactions  . Hctz [Hydrochlorothiazide] Other (See Comments)    Unknown rxn, "they just told me not to take it anymore"  . Seroquel [Quetiapine Fumarate] Other (See Comments)    Elevates liver enzymes     Social History:   Social History   Socioeconomic History  . Marital status: Divorced    Spouse name: Not on file  . Number of children: Not on file  . Years of education: Not on file  . Highest education level: Not on file  Occupational History  . Occupation: disability  Tobacco Use  . Smoking status: Current Every Day Smoker    Packs/day: 1.50    Years: 35.00    Pack years: 52.50  . Smokeless  tobacco: Never Used  Substance and Sexual Activity  . Alcohol use: No  . Drug use: No  . Sexual activity: Not Currently  Other Topics Concern  . Not on file  Social History Narrative  . Not on file   Social Determinants of Health   Financial Resource Strain:   . Difficulty of Paying Living Expenses: Not on file  Food Insecurity:   . Worried About Programme researcher, broadcasting/film/video in the Last Year: Not on file  . Ran Out of Food in the Last Year: Not on file  Transportation Needs:   . Lack of Transportation (Medical): Not on file  . Lack of Transportation (Non-Medical): Not on file  Physical Activity:   .  Days of Exercise per Week: Not on file  . Minutes of Exercise per Session: Not on file  Stress:   . Feeling of Stress : Not on file  Social Connections:   . Frequency of Communication with Friends and Family: Not on file  . Frequency of Social Gatherings with Friends and Family: Not on file  . Attends Religious Services: Not on file  . Active Member of Clubs or Organizations: Not on file  . Attends Banker Meetings: Not on file  . Marital Status: Not on file  Intimate Partner Violence:   . Fear of Current or Ex-Partner: Not on file  . Emotionally Abused: Not on file  . Physically Abused: Not on file  . Sexually Abused: Not on file    Family History:    Family History  Problem Relation Age of Onset  . Aneurysm Mother        Brain  . Brain cancer Father 55  . Healthy Sister   . Healthy Brother   . Healthy Sister   . Healthy Sister      ROS:  Please see the history of present illness.   All other ROS reviewed and negative.     Physical Exam/Data:   Vitals:   08/27/19 1104 08/27/19 1106 08/27/19 1107 08/27/19 1109  BP: (!) 157/96 (!) 168/95 (!) 171/99 (!) 175/103  Pulse:      Resp:      Temp:      TempSrc:      SpO2:      Weight:      Height:        Intake/Output Summary (Last 24 hours) at 08/27/2019 1136 Last data filed at 08/27/2019 0102 Gross per 24  hour  Intake 17.73 ml  Output --  Net 17.73 ml   Last 3 Weights 08/26/2019 07/23/2019 01/03/2019  Weight (lbs) 249 lb 1.9 oz 250 lb 275 lb  Weight (kg) 113 kg 113.399 kg 124.739 kg  Some encounter information is confidential and restricted. Go to Review Flowsheets activity to see all data.     Body mass index is 35.74 kg/m.  General:  Well nourished, well developed, in no acute distress HEENT: normal Lymph: no adenopathy Neck: no JVD Endocrine:  No thryomegaly Vascular: No carotid bruits; FA pulses 2+ bilaterally without bruits  Cardiac:  normal S1, S2; RRR; no murmur  Lungs:  clear to auscultation bilaterally, no wheezing, rhonchi or rales  Abd: soft, nontender, no hepatomegaly  Ext: no edema Musculoskeletal:  No deformities, BUE and BLE strength normal and equal Skin: warm and dry  Neuro:  CNs 2-12 intact, no focal abnormalities noted Psych:  Normal affect   EKG:  The EKG was personally reviewed and demonstrates:  Sinus rhythm, 69 bpm, LAD, consider left anterior fascicular block Abnormal R-wave progression, early transition. Very mild TWI in V1-V3 Telemetry:  NSR  Relevant CV Studies:  Myocardial perfusion study in process  Laboratory Data:  High Sensitivity Troponin:   Recent Labs  Lab 08/15/19 0003 08/26/19 2044 08/26/19 2228 08/27/19 0203 08/27/19 0349  TROPONINIHS 14 22* 23* 28* 30*     Chemistry Recent Labs  Lab 08/27/19 0203 08/27/19 0349 08/27/19 0813  NA 132* 135 138  K 4.8 4.8 4.3  CL 101 100 104  CO2 16* 21* 21*  GLUCOSE 190* 164* 133*  BUN 15 16 16   CREATININE 0.64 0.64 0.67  CALCIUM 9.6 9.7 9.5  GFRNONAA >60 >60 >60  GFRAA >60 >60 >60  ANIONGAP  15 14 13     Recent Labs  Lab 08/26/19 2044 08/27/19 0349  PROT 7.5  --   ALBUMIN 3.9  --   AST 25  --   ALT 23  --   ALKPHOS 70  --   BILITOT 1.7* 1.5*   Hematology Recent Labs  Lab 08/26/19 2044 08/26/19 2233  WBC 9.8  --   RBC 5.22  --   HGB 17.2* 16.7  HCT 47.2 49.0  MCV 90.4   --   MCH 33.0  --   MCHC 36.4*  --   RDW 13.1  --   PLT 254  --    BNPNo results for input(s): BNP, PROBNP in the last 168 hours.  DDimer  Recent Labs  Lab 08/27/19 0349  DDIMER 0.60*     Radiology/Studies:  DG Chest 2 View  Result Date: 08/26/2019 CLINICAL DATA:  Chest pain intermittently for 1 month with dizziness and jaw pain as well EXAM: CHEST - 2 VIEW COMPARISON:  Radiograph 08/14/2019 FINDINGS: Few streaky atelectatic changes in the bases. No consolidation, features of edema, pneumothorax, or effusion. Pulmonary vascularity is normally distributed. The cardiomediastinal contours are unremarkable. No acute osseous or soft tissue abnormality. IMPRESSION: Atelectasis, otherwise no acute cardiopulmonary abnormality. Electronically Signed   By: Kreg Shropshire M.D.   On: 08/26/2019 21:05       HEAR Score (for undifferentiated chest pain):  HEAR Score: 5    Assessment and Plan:   Chest pain -No prior cardiac history. -Patient with daily nonexertional chest pain since late December.  Chest discomfort is central chest to slight right with associated jaw pain and mild right arm pain.  Patient has mild dyspnea on exertion with fast walking or walking up stairs, possibly related to longtime smoking history. -CVD risk factors include hypertension, hyperlipidemia, poorly controlled diabetes, smoking, obesity. -High-sensitivity troponins mildly elevated and flat pattern 22>23>28>30. -EKG nonischemic, mild nonspecific T wave inversions in V1-V3.  Abnormal R wave progression, early transition. -Patient has significant risk factors and very likely has some degree of CAD. -Patient is currently undergoing a nuclear stress test. -Dr. Herbie Baltimore will evaluate patient once results of stress test obtained.  Hypertension -Home medications include amlodipine 10 mg, hydralazine 10 mg twice daily, lisinopril 40 mg  Hyperlipidemia -Patient is on pravastatin 80 mg daily -Triglycerides are significantly  elevated, >5000.  Patient has a past medical history of pancreatitis.  Diabetes on insulin/DKA  -Poorly controlled.  A1c 15.5 on 08/05/2019.  Had been 12.7 in 01/2019. -Current treatment for DKA by primary team, IV fluids and insulin drip.  Tobacco abuse -1.5 pack/day smoker -Patient advised on cessation to reduce his future cardiac risk.     Signed, Berton Bon, NP  08/27/2019 11:36 AM  ATTENDING ATTESTATION  I have seen, examined and evaluated the patient this PM along with Ms. Jeraldine Loots, NP.  After reviewing all the available data and chart, we discussed the patients laboratory, study & physical findings as well as symptoms in detail. I agree with her findings, examination as well as impression& recommendations.  I am seeing him after the results of his stress test show INTERMEDIATE RISK study with EF of 45 to 50% with partially reversible inferolateral defect consistent with prior infarct with peri-infarct ischemia in what looks like you the left circumflex or right posterolateral distribution.  The patient does not have any recollection of prior infarct, this would be suggestive of a new finding of ischemic CAD.  Given that he has had at least  1 month of ongoing intermittent chest pain, currently having chest pain here today requiring nitroglycerin, is reasonable to assess with cardiac catheterization as this is potentially an anginal equivalent.  Interestingly, with chest pain did not occur with exertion until today.  That would be somewhat unusual for a fixed ischemic CAD lesion, however the stress test does suggest prior infarct with peri-infarct ischemia and mild reduced EF.  The chest pain is nitroglycerin responsive suggesting possible scar.  He has significant risk factors of insulin-dependent diabetes, hypertriglyceridemia, obesity and hypertension all consistent with metabolic syndrome.  He will definitely need management of hypertension and hyperlipidemia as his ongoing risk  factors as well as diabetes.  Would probably consider further titration of hydralazine.   Beta-blocker if heart rate tolerates.    Plan: We will schedule for left heart catheterization possible PCI today. Further plans based on results.   Performing MD:  Bryan Lemma, M.D., M.S.  Procedure: Left Heart Catheterization Coronary Angiography and Possible Percutaneous, Intervention  The procedure with Risks/Benefits/Alternatives and Indications was reviewed with the patient.  All questions were answered.    Risks / Complications include, but not limited to: Death, MI, CVA/TIA, VF/VT (with defibrillation), Bradycardia (need for temporary pacer placement), contrast induced nephropathy, bleeding / bruising / hematoma / pseudoaneurysm, vascular or coronary injury (with possible emergent CT or Vascular Surgery), adverse medication reactions, infection.  Additional risks involving the use of radiation with the possibility of radiation burns and cancer were explained in detail.  The patient voices understanding and agree to proceed.      Bryan Lemma, M.D., M.S. Interventional Cardiologist   Pager # [phone] Phone # [phone] [address] [place], [place] [address]    For questions or updates, please contact [facility] Please consult [email or web address] for contact info under

## 2019-08-27 NOTE — Plan of Care (Signed)
Initiate Care Plan Problem: Education: Goal: Knowledge of General Education information will improve Description: Including pain rating scale, medication(s)/side effects and non-pharmacologic comfort measures Outcome: Progressing   Problem: Health Behavior/Discharge Planning: Goal: Ability to manage health-related needs will improve Outcome: Progressing   Problem: Clinical Measurements: Goal: Ability to maintain clinical measurements within normal limits will improve Outcome: Progressing Goal: Will remain free from infection Outcome: Progressing Goal: Diagnostic test results will improve Outcome: Progressing Goal: Respiratory complications will improve Outcome: Progressing Goal: Cardiovascular complication will be avoided Outcome: Progressing   Problem: Activity: Goal: Risk for activity intolerance will decrease Outcome: Progressing   Problem: Nutrition: Goal: Adequate nutrition will be maintained Outcome: Progressing   Problem: Coping: Goal: Level of anxiety will decrease Outcome: Progressing   Problem: Elimination: Goal: Will not experience complications related to bowel motility Outcome: Progressing Goal: Will not experience complications related to urinary retention Outcome: Progressing   Problem: Pain Managment: Goal: General experience of comfort will improve Outcome: Progressing   Problem: Safety: Goal: Ability to remain free from injury will improve Outcome: Progressing   Problem: Skin Integrity: Goal: Risk for impaired skin integrity will decrease Outcome: Progressing   Problem: Education: Goal: Required Educational Video(s) Outcome: Progressing   Problem: Clinical Measurements: Goal: Postoperative complications will be avoided or minimized Outcome: Progressing   Problem: Skin Integrity: Goal: Demonstration of wound healing without infection will improve Outcome: Progressing   

## 2019-08-27 NOTE — ED Notes (Signed)
Report given to Cath Lab RN's. Insulin drip going at 1.8 units/hr per [employer]. Patient A&O x4 and denies any pain at this time.

## 2019-08-27 NOTE — Progress Notes (Signed)
    Patient presented for Lexiscan nuclear stress test. Tolerated procedure well. Pending final stress imaging result.  Berton Bon, AGNP-C 08/27/2019  11:23 AM Pager: [phone]

## 2019-08-27 NOTE — H&P (Signed)
History and Physical    Jacob Brady CHE:[id number] DOB: 16-Aug-1960 DOA: 08/26/2019  PCP: [facility] Patient coming from: Home  Chief Complaint: Chest pain, suicidal ideation  HPI: Jacob Brady is a 59 y.o. male with medical history significant of poorly controlled insulin-dependent diabetes, hypertension, hypertriglyceridemia, pancreatitis, major depressive disorder, PTSD presenting to the ED with complaints of chest pain and suicidal ideation.  Patient was recently admitted for chest pain on 08/09/2019 and discharged home the same day after stable troponins and nonacute EKG.  He was offered cardiology evaluation in the hospital at that time but patient felt well and desired to be discharged home with plans to follow-up with [facility] for stress testing.  He was seen again for chest pain in the ED on 1/20 and cardiology did not recommend admission.  Patient states he does not have transportation and has not been able to follow-up at the [facility] for a stress test.  Reports 1 month history of very frequent episodes of nonexertional substernal chest pain which radiates to his neck/jaw and right arm.  He is not able to describe what the pain feels like but states it can be severe sometimes.  Each episode lasts a few minutes.  On average, he gets 15-20 episodes of chest pain per day.  This usually happens after he smokes a cigarette.  His breathing becomes shallow every time he gets chest pain.  No associated diaphoresis or nausea.  States when he walks to the grocery store a mile away and brings groceries up 1 flight of stairs he does not experience any chest pain.  Patient states he has been feeling very depressed due to his chest pain and has not used his insulin for several days.  He borrowed a gun from a friend with plan to shoot himself because the chest pain has been bothering him very much and affecting the quality of his life.  ED Course: Afebrile.  High-sensitivity  troponin 22 >23.  EKG without acute ischemic changes.  No leukocytosis.  Sodium 128, previously low as well.  Bicarb 16, anion gap 20.  Blood glucose 639.  UA with ketones and beta hydroxybutyric acid elevated at 0.91.  VBG with pH 7.43.  T bili 1.7, remainder of LFTs normal.  Acetaminophen and salicylate levels undetectable.  UDS negative.  SARS-CoV-2 PCR test pending.  Chest x-ray showing atelectasis, no acute cardiopulmonary abnormality.  Patient received aspirin 324 mg.  Started on insulin infusion for DKA.  Review of Systems:  All systems reviewed and apart from history of presenting illness, are negative.  Past Medical History:  Diagnosis Date  . Arthritis   . Diabetes mellitus without complication ([place])   . Hypertension   . Pancreatitis   . PTSD (post-traumatic stress disorder)     Past Surgical History:  Procedure Laterality Date  . CHOLECYSTECTOMY    . WRIST FRACTURE SURGERY Right      reports that he has been smoking. He has a 52.50 pack-year smoking history. He has never used smokeless tobacco. He reports that he does not drink alcohol or use drugs.  Allergies  Allergen Reactions  . Hctz [Hydrochlorothiazide] Other (See Comments)    Unknown rxn, "they just told me not to take it anymore"  . Seroquel [Quetiapine Fumarate] Other (See Comments)    Elevates liver enzymes     Family History  Problem Relation Age of Onset  . Aneurysm Mother   . Brain cancer Father 24    Prior to  Admission medications   Medication Sig Start Date End Date Taking? Authorizing Provider  amLODipine (NORVASC) 10 MG tablet Take 1 tablet (10 mg total) by mouth daily. 01/09/19   Aldean Baker, NP  aspirin EC 81 MG tablet Take 1 tablet (81 mg total) by mouth daily. 08/09/19 08/08/20  Standley Brooking, MD  busPIRone (BUSPAR) 10 MG tablet Take 10 mg by mouth at bedtime.     [provider]  divalproex (DEPAKOTE) 500 MG DR tablet Take 1,000 mg by mouth at bedtime.    [provider]  gabapentin (NEURONTIN) 300 MG capsule Take 600 mg by mouth at bedtime.     [provider]  hydrALAZINE (APRESOLINE) 10 MG tablet Take 10 mg by mouth 2 (two) times daily.    [provider]  ibuprofen (ADVIL) 200 MG tablet Take 400 mg by mouth every 6 (six) hours as needed for moderate pain.    [provider]  insulin NPH-regular Human (70-30) 100 UNIT/ML injection Inject 25 Units into the skin 2 (two) times daily with a meal.    [provider]  lisinopril (ZESTRIL) 40 MG tablet Take 40 mg by mouth daily.    [provider]  mirtazapine (REMERON) 30 MG tablet Take 1 tablet (30 mg total) by mouth at bedtime. 01/08/19   Aldean Baker, NP  pravastatin (PRAVACHOL) 80 MG tablet Take 80 mg by mouth at bedtime.     [provider]  venlafaxine (EFFEXOR) 75 MG tablet Take 150 mg by mouth every morning.     [provider]    Physical Exam: Vitals:   08/26/19 2315 08/26/19 2330 08/27/19 0000 08/27/19 0030  BP: 135/89 137/74 (!) 143/93 (!) 138/93  Pulse:      Resp: 18 (!) 22 (!) 22 19  Temp:      TempSrc:      SpO2:    98%  Weight:      Height:        Physical Exam  Constitutional: He is oriented to person, place, and time. He appears well-developed and well-nourished. No distress.  HENT:  Head: Normocephalic.  Eyes: Right eye exhibits no discharge. Left eye exhibits no discharge.  Cardiovascular: Normal rate, regular rhythm and intact distal pulses.  Pulmonary/Chest: Effort normal and breath sounds normal. No respiratory distress. He has no wheezes. He has no rales.  Abdominal: Soft. Bowel sounds are normal. He exhibits no distension. There is no abdominal tenderness. There is no guarding.  Musculoskeletal:        General: No edema.     Cervical back: Neck supple.  Neurological: He is alert and oriented to person, place, and time.  Skin: Skin is warm and dry. He is not diaphoretic.     Labs on Admission: I have  personally reviewed following labs and imaging studies  CBC: Recent Labs  Lab 08/26/19 2044 08/26/19 2233  WBC 9.8  --   HGB 17.2* 16.7  HCT 47.2 49.0  MCV 90.4  --   PLT 254  --    Basic Metabolic Panel: Recent Labs  Lab 08/26/19 2044 08/26/19 2233  NA 128* 126*  K 4.9 5.3*  CL 92*  --   CO2 16*  --   GLUCOSE 639*  --   BUN 18  --   CREATININE 1.01  --   CALCIUM 10.4*  --    GFR: Estimated Creatinine Clearance: 102.8 mL/min (by C-G formula based on SCr of 1.01 mg/dL). Liver  Function Tests: Recent Labs  Lab 08/26/19 2044  AST 25  ALT 23  ALKPHOS 70  BILITOT 1.7*  PROT 7.5  ALBUMIN 3.9   No results for input(s): LIPASE, AMYLASE in the last 168 hours. No results for input(s): AMMONIA in the last 168 hours. Coagulation Profile: No results for input(s): INR, PROTIME in the last 168 hours. Cardiac Enzymes: No results for input(s): CKTOTAL, CKMB, CKMBINDEX, TROPONINI in the last 168 hours. BNP (last 3 results) No results for input(s): PROBNP in the last 8760 hours. HbA1C: No results for input(s): HGBA1C in the last 72 hours. CBG: Recent Labs  Lab 08/26/19 2330 08/26/19 2356 08/27/19 0035 08/27/19 0114 08/27/19 0232  GLUCAP 562* 527* 447* 269* 211*   Lipid Profile: No results for input(s): CHOL, HDL, LDLCALC, TRIG, CHOLHDL, LDLDIRECT in the last 72 hours. Thyroid Function Tests: No results for input(s): TSH, T4TOTAL, FREET4, T3FREE, THYROIDAB in the last 72 hours. Anemia Panel: No results for input(s): VITAMINB12, FOLATE, FERRITIN, TIBC, IRON, RETICCTPCT in the last 72 hours. Urine analysis:    Component Value Date/Time   COLORURINE STRAW (A) 08/26/2019 2333   APPEARANCEUR CLEAR 08/26/2019 2333   LABSPEC 1.026 08/26/2019 2333   PHURINE 6.0 08/26/2019 2333   GLUCOSEU >=500 (A) 08/26/2019 2333   HGBUR NEGATIVE 08/26/2019 2333   BILIRUBINUR NEGATIVE 08/26/2019 2333   KETONESUR 20 (A) 08/26/2019 2333   PROTEINUR NEGATIVE 08/26/2019 2333   NITRITE  NEGATIVE 08/26/2019 2333   LEUKOCYTESUR NEGATIVE 08/26/2019 2333    Radiological Exams on Admission: DG Chest 2 View  Result Date: 08/26/2019 CLINICAL DATA:  Chest pain intermittently for 1 month with dizziness and jaw pain as well EXAM: CHEST - 2 VIEW COMPARISON:  Radiograph 08/14/2019 FINDINGS: Few streaky atelectatic changes in the bases. No consolidation, features of edema, pneumothorax, or effusion. Pulmonary vascularity is normally distributed. The cardiomediastinal contours are unremarkable. No acute osseous or soft tissue abnormality. IMPRESSION: Atelectasis, otherwise no acute cardiopulmonary abnormality. Electronically Signed   By: Kreg Shropshire M.D.   On: 08/26/2019 21:05    EKG: Independently reviewed.  Sinus tachycardia, heart rate 102.  Left anterior fascicular block, incomplete right bundle branch block.  No acute ischemic changes.  Assessment/Plan Principal Problem:   DKA (diabetic ketoacidoses) (HCC) Active Problems:   Essential hypertension   Diabetes mellitus type 2 in obese [facility])   Chest pain   Suicide ideation   DKA in the setting of known poorly controlled insulin-dependent diabetes Suspect related to insulin nonadherence.  A1c 15.5 on 08/05/2019.  Bicarb 16, anion gap 20.  Blood glucose 639.  UA with ketones and beta hydroxybutyric acid elevated at 0.91.  VBG with pH 7.43. -Continue insulin per DKA protocol -Received fluid bolus in the ED, continue IV fluid hydration with normal saline at 200 cc/h.  When CBG less than 250, switch to D5-1 half normal saline at 75 cc/h. -BMP every 4 hours -CBG checks every 1 hour -When DKA resolves, initiate diet and subcutaneous insulin.  Continue insulin infusion for an additional 1 to 2 hours. -Diabetes coordinator consult  Chest pain High-sensitivity troponin mildly elevated but remaining flat (22>23).  Mild troponin elevation likely due to demand ischemia in the setting of DKA.  EKG without acute ischemic changes.  Although  chest pain appears atypical based on history, patient does have significant risk factors for CAD and [facility] not [facility].  -Discussed with cardiology, recommended ordering a stress test.  Order has been placed. -  Cardiac monitoring -Received full dose aspirin -Trend troponin -Check lipid panel -Sublingual nitroglycerin as needed -EKG as needed for recurrence of chest pain  PE less likely given no tachycardia or hypoxia.  Check D-dimer level,  Suicidal ideation, history of major depressive disorder and PTSD -Suicide precautions -Sitter at bedside -Psychiatry consult.  Medication management per psychiatry recommendations.  Hypertriglyceridemia, past medical history of pancreatitis -Check lipid panel -Resume home pravastatin after pharmacy med rec is completed.  Consider adding fenofibrate as an outpatient.  Hypertension -Currently normotensive.  Resume home medications after pharmacy med rec is completed.  Chronic hyponatremia Sodium 128, low on previous labs as well. -IV fluid hydration as above -Check serum osmolarity -Monitor sodium level closely  Mild T bili elevation T bili 1.7, remainder of LFTs normal.  T bili elevated on recent labs as well but now significantly improved. -Check fractionated bilirubin level  Tobacco use -NicoDerm patch -Counseling  Pharmacy med rec pending.  DVT prophylaxis: Lovenox Code Status: Full code Family Communication: No family available at this time. Disposition Plan: Anticipate discharge after cardiac work-up is completed and clearance from psychiatry. Consults called: Cardiology (Dr. Noemi Chapel), psychiatry Admission status: It is my clinical opinion that referral for OBSERVATION is reasonable and necessary in this patient based on the above information provided. The aforementioned taken together are felt to place the patient at high risk for further clinical deterioration. However it is anticipated  that the patient may be medically stable for discharge from the hospital within 24 to 48 hours.  The medical decision making on this patient was of high complexity and the patient is at high risk for clinical deterioration, therefore this is a level 3 visit.  John Giovanni MD [facility]  If 7PM-7AM, please contact night-coverage [email or web address] Password [facility]  08/27/2019, 2:38 AM

## 2019-08-27 NOTE — ED Notes (Signed)
Breakfast ordered 

## 2019-08-27 NOTE — Interval H&P Note (Signed)
Cath Lab Visit (complete for each Cath Lab visit)  Clinical Evaluation Leading to the Procedure:   ACS: No.  Non-ACS:    Anginal Classification: CCS III  Anti-ischemic medical therapy: Minimal Therapy (1 class of medications)  Non-Invasive Test Results: Intermediate-risk stress test findings: cardiac mortality 1-3%/year  Prior CABG: No previous CABG      History and Physical Interval Note:  08/27/2019 4:30 PM  Finas Dana Dorner  has presented today for surgery, with the diagnosis of unstable angina.  The various methods of treatment have been discussed with the patient and family. After consideration of risks, benefits and other options for treatment, the patient has consented to  Procedure(s): LEFT HEART CATH AND CORONARY ANGIOGRAPHY (N/A) as a surgical intervention.  The patient's history has been reviewed, patient examined, no change in status, stable for surgery.  I have reviewed the patient's chart and labs.  Questions were answered to the patient's satisfaction.     Nicki Guadalajara

## 2019-08-28 ENCOUNTER — Inpatient Hospital Stay (HOSPITAL_COMMUNITY): Payer: No Typology Code available for payment source

## 2019-08-28 ENCOUNTER — Other Ambulatory Visit: Payer: Self-pay | Admitting: *Deleted

## 2019-08-28 DIAGNOSIS — E0810 Diabetes mellitus due to underlying condition with ketoacidosis without coma: Secondary | ICD-10-CM

## 2019-08-28 DIAGNOSIS — Z01810 Encounter for preprocedural cardiovascular examination: Secondary | ICD-10-CM

## 2019-08-28 DIAGNOSIS — I200 Unstable angina: Secondary | ICD-10-CM

## 2019-08-28 DIAGNOSIS — E1169 Type 2 diabetes mellitus with other specified complication: Secondary | ICD-10-CM

## 2019-08-28 DIAGNOSIS — I2510 Atherosclerotic heart disease of native coronary artery without angina pectoris: Secondary | ICD-10-CM

## 2019-08-28 DIAGNOSIS — R079 Chest pain, unspecified: Secondary | ICD-10-CM

## 2019-08-28 DIAGNOSIS — E669 Obesity, unspecified: Secondary | ICD-10-CM

## 2019-08-28 DIAGNOSIS — I10 Essential (primary) hypertension: Secondary | ICD-10-CM

## 2019-08-28 DIAGNOSIS — R45851 Suicidal ideations: Secondary | ICD-10-CM

## 2019-08-28 DIAGNOSIS — R778 Other specified abnormalities of plasma proteins: Secondary | ICD-10-CM

## 2019-08-28 DIAGNOSIS — I25110 Atherosclerotic heart disease of native coronary artery with unstable angina pectoris: Secondary | ICD-10-CM

## 2019-08-28 DIAGNOSIS — E781 Pure hyperglyceridemia: Secondary | ICD-10-CM

## 2019-08-28 DIAGNOSIS — E785 Hyperlipidemia, unspecified: Secondary | ICD-10-CM

## 2019-08-28 DIAGNOSIS — E1110 Type 2 diabetes mellitus with ketoacidosis without coma: Secondary | ICD-10-CM

## 2019-08-28 HISTORY — PX: TRANSTHORACIC ECHOCARDIOGRAM: SHX275

## 2019-08-28 LAB — CBC WITH DIFFERENTIAL/PLATELET
Abs Immature Granulocytes: 0.06 10*3/uL (ref 0.00–0.07)
Basophils Absolute: 0.1 10*3/uL (ref 0.0–0.1)
Basophils Relative: 1 %
Eosinophils Absolute: 0.3 10*3/uL (ref 0.0–0.5)
Eosinophils Relative: 3 %
HCT: 42.4 % (ref 39.0–52.0)
Hemoglobin: 14.4 g/dL (ref 13.0–17.0)
Immature Granulocytes: 1 %
Lymphocytes Relative: 45 %
Lymphs Abs: 4.5 10*3/uL — ABNORMAL HIGH (ref 0.7–4.0)
MCH: 31 pg (ref 26.0–34.0)
MCHC: 34 g/dL (ref 30.0–36.0)
MCV: 91.4 fL (ref 80.0–100.0)
Monocytes Absolute: 0.8 10*3/uL (ref 0.1–1.0)
Monocytes Relative: 8 %
Neutro Abs: 4.2 10*3/uL (ref 1.7–7.7)
Neutrophils Relative %: 42 %
Platelets: 193 10*3/uL (ref 150–400)
RBC: 4.64 MIL/uL (ref 4.22–5.81)
RDW: 13.6 % (ref 11.5–15.5)
WBC: 9.9 10*3/uL (ref 4.0–10.5)
nRBC: 0 % (ref 0.0–0.2)

## 2019-08-28 LAB — ECHOCARDIOGRAM COMPLETE
Height: 70 in
Weight: 3985.92 oz

## 2019-08-28 LAB — GLUCOSE, CAPILLARY
Glucose-Capillary: 109 mg/dL — ABNORMAL HIGH (ref 70–99)
Glucose-Capillary: 113 mg/dL — ABNORMAL HIGH (ref 70–99)
Glucose-Capillary: 124 mg/dL — ABNORMAL HIGH (ref 70–99)
Glucose-Capillary: 134 mg/dL — ABNORMAL HIGH (ref 70–99)
Glucose-Capillary: 156 mg/dL — ABNORMAL HIGH (ref 70–99)
Glucose-Capillary: 157 mg/dL — ABNORMAL HIGH (ref 70–99)
Glucose-Capillary: 158 mg/dL — ABNORMAL HIGH (ref 70–99)
Glucose-Capillary: 166 mg/dL — ABNORMAL HIGH (ref 70–99)
Glucose-Capillary: 174 mg/dL — ABNORMAL HIGH (ref 70–99)
Glucose-Capillary: 180 mg/dL — ABNORMAL HIGH (ref 70–99)
Glucose-Capillary: 189 mg/dL — ABNORMAL HIGH (ref 70–99)
Glucose-Capillary: 191 mg/dL — ABNORMAL HIGH (ref 70–99)
Glucose-Capillary: 225 mg/dL — ABNORMAL HIGH (ref 70–99)
Glucose-Capillary: 242 mg/dL — ABNORMAL HIGH (ref 70–99)
Glucose-Capillary: 294 mg/dL — ABNORMAL HIGH (ref 70–99)
Glucose-Capillary: 306 mg/dL — ABNORMAL HIGH (ref 70–99)
Glucose-Capillary: 353 mg/dL — ABNORMAL HIGH (ref 70–99)
Glucose-Capillary: 363 mg/dL — ABNORMAL HIGH (ref 70–99)
Glucose-Capillary: 402 mg/dL — ABNORMAL HIGH (ref 70–99)

## 2019-08-28 LAB — COMPREHENSIVE METABOLIC PANEL
ALT: 23 U/L (ref 0–44)
AST: 21 U/L (ref 15–41)
Albumin: 3 g/dL — ABNORMAL LOW (ref 3.5–5.0)
Alkaline Phosphatase: 49 U/L (ref 38–126)
Anion gap: 11 (ref 5–15)
BUN: 12 mg/dL (ref 6–20)
CO2: 21 mmol/L — ABNORMAL LOW (ref 22–32)
Calcium: 8.7 mg/dL — ABNORMAL LOW (ref 8.9–10.3)
Chloride: 102 mmol/L (ref 98–111)
Creatinine, Ser: 0.75 mg/dL (ref 0.61–1.24)
GFR calc Af Amer: >60 mL/min (ref >60)
GFR calc non Af Amer: >60 mL/min (ref >60)
Glucose, Bld: 127 mg/dL — ABNORMAL HIGH (ref 70–99)
Potassium: 3.6 mmol/L (ref 3.5–5.1)
Sodium: 134 mmol/L — ABNORMAL LOW (ref 135–145)
Total Bilirubin: 0.5 mg/dL (ref 0.3–1.2)
Total Protein: 6 g/dL — ABNORMAL LOW (ref 6.5–8.1)

## 2019-08-28 LAB — MAGNESIUM: Magnesium: 2 mg/dL (ref 1.7–2.4)

## 2019-08-28 LAB — PULMONARY FUNCTION TEST
FEF 25-75 Pre: 1.85 L/sec
FEF2575-%Pred-Pre: 58 %
FEV1-%Pred-Pre: 77 %
FEV1-Pre: 2.9 L
FEV1FVC-%Pred-Pre: 92 %
FEV6-%Pred-Pre: 82 %
FEV6-Pre: 3.85 L
FEV6FVC-%Pred-Pre: 98 %
FVC-%Pred-Pre: 83 %
FVC-Pre: 4.08 L
Pre FEV1/FVC ratio: 71 %
Pre FEV6/FVC Ratio: 94 %

## 2019-08-28 LAB — TRIGLYCERIDES: Triglycerides: 1756 mg/dL — ABNORMAL HIGH (ref <150)

## 2019-08-28 LAB — HEPARIN LEVEL (UNFRACTIONATED)
Heparin Unfractionated: <0.1 IU/mL — ABNORMAL LOW (ref 0.30–0.70)
Heparin Unfractionated: 0.12 IU/mL — ABNORMAL LOW (ref 0.30–0.70)

## 2019-08-28 MED ORDER — INSULIN ASPART 100 UNIT/ML ~~LOC~~ SOLN: 0.0000 [IU] | Freq: Every day | SUBCUTANEOUS | Status: DC

## 2019-08-28 MED ORDER — OXYCODONE-ACETAMINOPHEN 5-325 MG PO TABS
2.0000 | ORAL_TABLET | Freq: Once | ORAL | Status: AC
  Administered 2019-08-28: 2 via ORAL
  Filled 2019-08-28: qty 2

## 2019-08-28 MED ORDER — INSULIN REGULAR(HUMAN) IN NACL 100-0.9 UT/100ML-% IV SOLN
INTRAVENOUS | Status: DC
  Administered 2019-08-28: 10:00:00 1.2 [IU]/h via INTRAVENOUS
  Filled 2019-08-28: qty 100

## 2019-08-28 MED ORDER — BUSPIRONE HCL 10 MG PO TABS
10.0000 mg | ORAL_TABLET | Freq: Every day | ORAL | Status: DC
  Administered 2019-08-28 – 2019-08-29 (×2): 10 mg via ORAL
  Filled 2019-08-28 (×2): qty 1

## 2019-08-28 MED ORDER — VENLAFAXINE HCL 75 MG PO TABS
150.0000 mg | ORAL_TABLET | Freq: Every day | ORAL | Status: DC
  Administered 2019-08-28 – 2019-08-29 (×2): 150 mg via ORAL
  Filled 2019-08-28 (×3): qty 2

## 2019-08-28 MED ORDER — INSULIN REGULAR(HUMAN) IN NACL 100-0.9 UT/100ML-% IV SOLN
INTRAVENOUS | Status: DC
  Filled 2019-08-28: qty 100

## 2019-08-28 MED ORDER — ATORVASTATIN CALCIUM 80 MG PO TABS
80.0000 mg | ORAL_TABLET | Freq: Every day | ORAL | Status: DC
  Administered 2019-08-28 – 2019-08-29 (×2): 80 mg via ORAL
  Filled 2019-08-28 (×2): qty 1

## 2019-08-28 MED ORDER — MORPHINE SULFATE (PF) 2 MG/ML IV SOLN
2.0000 mg | INTRAVENOUS | Status: DC | PRN
  Administered 2019-08-28 – 2019-08-30 (×6): 2 mg via INTRAVENOUS
  Filled 2019-08-28 (×6): qty 1

## 2019-08-28 MED ORDER — DIVALPROEX SODIUM 500 MG PO DR TAB
1000.0000 mg | DELAYED_RELEASE_TABLET | Freq: Every day | ORAL | Status: DC
  Administered 2019-08-28 – 2019-09-05 (×9): 1000 mg via ORAL
  Filled 2019-08-28 (×12): qty 2

## 2019-08-28 MED ORDER — MIRTAZAPINE 7.5 MG PO TABS
30.0000 mg | ORAL_TABLET | Freq: Every day | ORAL | Status: DC
  Administered 2019-08-28 – 2019-08-29 (×2): 30 mg via ORAL
  Filled 2019-08-28 (×2): qty 4

## 2019-08-28 MED ORDER — DEXTROSE 50 % IV SOLN: 0.0000 mL | INTRAVENOUS | Status: DC | PRN

## 2019-08-28 MED ORDER — DEXTROSE-NACL 5-0.45 % IV SOLN: INTRAVENOUS | Status: DC

## 2019-08-28 MED ORDER — SODIUM CHLORIDE 0.9 % IV SOLN: INTRAVENOUS | Status: DC

## 2019-08-28 MED ORDER — GABAPENTIN 300 MG PO CAPS
600.0000 mg | ORAL_CAPSULE | Freq: Every day | ORAL | Status: DC
  Administered 2019-08-28 – 2019-08-29 (×2): 600 mg via ORAL
  Filled 2019-08-28 (×2): qty 2

## 2019-08-28 MED ORDER — ALPRAZOLAM 0.25 MG PO TABS
0.2500 mg | ORAL_TABLET | Freq: Three times a day (TID) | ORAL | Status: DC | PRN
  Administered 2019-08-28 – 2019-08-30 (×5): 0.25 mg via ORAL
  Filled 2019-08-28 (×5): qty 1

## 2019-08-28 MED ORDER — FENOFIBRATE 160 MG PO TABS
160.0000 mg | ORAL_TABLET | Freq: Every day | ORAL | Status: DC
  Administered 2019-08-28 – 2019-08-29 (×2): 160 mg via ORAL
  Filled 2019-08-28 (×2): qty 1

## 2019-08-28 MED ORDER — INSULIN (MYXREDLIN) INFUSION FOR HYPERTRIGLYCERIDEMIA: 0.2000 [IU]/kg/h | INTRAVENOUS | Status: DC

## 2019-08-28 MED ORDER — INSULIN ASPART 100 UNIT/ML ~~LOC~~ SOLN
0.0000 [IU] | Freq: Three times a day (TID) | SUBCUTANEOUS | Status: DC
  Administered 2019-08-28: 07:00:00 2 [IU] via SUBCUTANEOUS

## 2019-08-28 NOTE — Progress Notes (Signed)
  Echocardiogram 2D Echocardiogram has been attempted. Patient not in room.  [name] G Malik Paar 08/28/2019, 11:57 AM

## 2019-08-28 NOTE — Plan of Care (Signed)

## 2019-08-28 NOTE — Progress Notes (Signed)
Results for MCLEAN, MOYA (MRN [id number]) as of 08/28/2019 08:00  Ref. Range 08/05/2019 07:39  Hemoglobin A1C Latest Ref Range: 4.8 - 5.6 % 15.5 (H)     Admit with: CP/ Suicidal Ideation/ DKA  History: DM, Pancreatitis, PTSD, Depression  Home DM Meds: 70/30 Insulin 25 units BID  Current Orders: IV Insulin Drip    Met w/ pt today to discuss current A1c of 15.5%.  Pt told me he has no social support at home.  Lives by himself and does not have a car.  Cannot get to [facility] appointments b/c he lacks transportation and told me he cannot afford taxi rides to appointments due to limited income.  Gets all meds and diabetes supplies thru the [place].  [facility] syringes, and CBG meter supplies.   Pt admitted to me today that he has not been taking his insulin on a regular basis and has not been checking CBGs.  Admits to very poor diet (high carb, high fat) and stated to me he feels like his depression is making his self-care worse.  Pt stated he knows what foods are healthy, just has not been making an effort to eat well.  Seemed very nervous about the possibility of needing CABG and was very worried that if the MDs changes his meds at d/c that the healthcare team needs to get I touch with the [facility] to make sure the rxs are sent thru the [place] so they will be covered.  Have placed consult to [facility] team to follow pt while in hospital and to help with discharge planning/Rxs.  Explained what an A1c is and what it measures.  Reminded patient that his goal A1c is 7% or less per ADA standards to prevent both acute and long-term complications.  Explained to patient the extreme importance of good glucose control at home.  Encouraged patient to check his CBGs at least bid at home before meals and to record all CBGs in a logbook for his PCP to review.    --Will follow patient during hospitalization--  Wyn Quaker RN, MSN, CDE Diabetes Coordinator Inpatient Glycemic  Control Team Team Pager: [phone] (8a-5p)

## 2019-08-28 NOTE — Progress Notes (Signed)
ANTICOAGULATION CONSULT NOTE - Follow Up Consult  Pharmacy Consult for IV Heparin Indication: chest pain/ACS  Allergies  Allergen Reactions  . Hctz [Hydrochlorothiazide] Other (See Comments)    Unknown rxn, "they just told me not to take it anymore"  . Seroquel [Quetiapine Fumarate] Other (See Comments)    Elevates liver enzymes     Patient Measurements: Height: 5\' 10"  (177.8 cm) Weight: 249 lb 1.9 oz (113 kg) IBW/kg (Calculated) : 73 Heparin Dosing Weight: 97.8 kg  Vital Signs:    Labs: Recent Labs    08/26/19 2044 08/26/19 2044 08/26/19 2228 08/26/19 2233 08/26/19 2233 08/27/19 0203 08/27/19 0203 08/27/19 0349 08/27/19 0813 08/27/19 1321 08/27/19 1729 08/28/19 0305 08/28/19 0818 08/28/19 1532  HGB 17.2*   < >  --  16.7   < >  --   --   --   --   --  14.3 14.4  --   --   HCT 47.2   < >  --  49.0  --   --   --   --   --   --  42.0 42.4  --   --   PLT 254  --   --   --   --   --   --   --   --   --   --  193  --   --   HEPARINUNFRC  --   --   --   --   --   --   --   --   --   --   --   --  <0.10* 0.12*  CREATININE 1.01   < >  --   --   --  0.64   < > 0.64   < > 0.44* 0.40* 0.75  --   --   TROPONINIHS 22*   < > 23*  --   --  28*  --  30*  --   --   --   --   --   --    < > = values in this interval not displayed.    Estimated Creatinine Clearance: 129.8 mL/min (by C-G formula based on SCr of 0.75 mg/dL).   Medical History: Past Medical History:  Diagnosis Date  . Arthritis   . Diabetes mellitus without complication (HCC)   . Hypertension   . Pancreatitis   . PTSD (post-traumatic stress disorder)     Assessment: 59 year old male now s/p cardiac cath found to have severe multi-vessel CAD with plan for CABG on 09/19/19; pharmacy was consulted to dose and monitor heparin therapy.  Heparin level ~6 hrs after heparin infusion was increased to 1400 units/hr was 0.12 units/ml, which is below the goal range for this pt. CBC WNL. Per RN, no issues with IV; pt has  small amount of bleeding at cath site (no hematoma).   Goal of Therapy:  Heparin level 0.3-0.7 units/ml Monitor platelets by anticoagulation protocol: Yes   Plan:  Increase heparin infusion to 1700 units/hr Recheck level in 6 hours  Monitor daily heparin level and CBC while on therapy.  Monitor for signs and symptoms of bleeding.   09/21/19, PharmD, BCPS, [facility] Clinical Pharmacist 08/28/2019 4:36 PM

## 2019-08-28 NOTE — Consult Note (Signed)
TCTS Preliminary Consult Note  Pt seen and evaluated; imaging and chart reviewed. Full report to follow  Plan CABG on 08/30/19. In meantime, completing routine work-up. He appears to be a very good candidate for multi-vessel CABG.   Aviana Shevlin Z. Vickey Sages, MD [phone]

## 2019-08-28 NOTE — Progress Notes (Signed)
Patient ID: Jacob Brady, male   DOB: March 26, 1961, 59 y.o.   MRN: [id number]  PROGRESS NOTE    Jacob Brady  YKD:[id number] DOB: 07-21-1961 DOA: 08/26/2019 PCP: [facility]   Brief Narrative:  59 year old male with history of poorly controlled insulin-dependent diabetes mellitus, hypertension, hypertriglyceridemia, pancreatitis, major depressive disorder, PTSD presented with chest pain and suicidal ideation.  Cardiology was consulted.  He was found to be in DKA with blood glucose of 639, bicarb of 16 and anion gap of 20 and was started on insulin drip.  Urine drug screen was negative.  COVID-19 testing was negative.  Assessment & Plan:   Chest pain New diagnosis of severe multivessel coronary artery disease -Presented with chest pain.  High-sensitivity troponins did not elevate significantly.  Stress test was intermediate risk.  Subsequently underwent cardiac cath on 08/27/2019 showed severe multivessel CAD and EF of 45 to 50% -Cardiology following and has requested cardiothoracic surgery evaluation.  Currently still having intermittent chest pains.  Continue heparin and nitroglycerin drips.  Continue aspirin, metoprolol.  Hypertriglyceridemia Hypercholesterolemia -Presented with cholesterol of 536 and triglycerides of 5619.  Triglycerides is 1756 today.  We will continue insulin drip at least for 1 more day.  Repeat triglyceride in a.m. -We will start Lipitor 80 mg and fenofibrate as per cardiology recommendations.  Might need Vascepa upon discharge.  DKA in the setting of known uncontrolled insulin-dependent diabetes mellitus type 2 -Probably secondary to insulin nonadherence.  A1c 15.5 on 08/05/2019 -Presented with DKA and started on insulin drip.  Subsequently anion gap is closed but insulin drip has been continued because of hypertriglyceridemia.  Diabetes coordinator following.  Continue insulin drip for at least 24 more hours.  Continue current IV fluids.  Suicidal  ideation History of major depressive disorder/PTSD -Suicide precautions.  Sitter at bedside.  Psychiatry consult is still pending -We will resume home medications till evaluation by psychiatry  Hypertension -Blood pressure stable.  Continue metoprolol and nitroglycerin drip  Tobacco use -Patient has been counseled regarding tobacco cessation   DVT prophylaxis: Heparin drip  code Status: Full Family Communication: Spoke to patient at bedside Disposition Plan: We will remain inpatient and will require cardiothoracic surgery evaluation  Consultants: Cardiology/psychiatry consultation pending/cardiothoracic surgery consultation pending  Procedures: Stress test/cardiac catheterization  Antimicrobials: None   Subjective: Patient seen and examined at bedside.  Complains of intermittent chest pain.  No overnight fever, nausea or vomiting or worsening shortness of breath.  Objective: Vitals:   08/27/19 1900 08/27/19 2000 08/28/19 0000 08/28/19 0300  BP: 113/84 120/76 118/75 140/81  Pulse: 82 82 70 64  Resp: 16 17 18 15   Temp:  98 F (36.7 C)  97.7 F (36.5 C)  TempSrc:  Oral  Oral  SpO2:  95% 95% 95%  Weight:      Height:        Intake/Output Summary (Last 24 hours) at 08/28/2019 0950 Last data filed at 08/28/2019 10/26/2019 Gross per 24 hour  Intake 3085.46 ml  Output 2875 ml  Net 210.46 ml   Filed Weights   08/26/19 2044  Weight: 113 kg    Examination:  General exam: Appears calm and comfortable.  Poor historian.  Sitter at bedside. Respiratory system: Bilateral decreased breath sounds at bases Cardiovascular system: S1 & S2 heard, Rate controlled Gastrointestinal system: Abdomen is nondistended, soft and nontender. Normal bowel sounds heard. Extremities: No cyanosis, clubbing, edema  Central nervous system: Alert and oriented. No focal neurological deficits. Moving extremities Skin: No rashes,  lesions or ulcers Psychiatry: Flat affect.    Data Reviewed: I have  personally reviewed following labs and imaging studies  CBC: Recent Labs  Lab 08/26/19 2044 08/26/19 2233 08/27/19 1729 08/28/19 0305  WBC 9.8  --   --  9.9  NEUTROABS  --   --   --  4.2  HGB 17.2* 16.7 14.3 14.4  HCT 47.2 49.0 42.0 42.4  MCV 90.4  --   --  91.4  PLT 254  --   --  193   Basic Metabolic Panel: Recent Labs  Lab 08/27/19 0203 08/27/19 0203 08/27/19 0349 08/27/19 0813 08/27/19 1321 08/27/19 1729 08/28/19 0305  NA 132*   < > 135 138 131* 132* 134*  K 4.8   < > 4.8 4.3 3.6 3.4* 3.6  CL 101   < > 100 104 97* 101 102  CO2 16*  --  21* 21* 20*  --  21*  GLUCOSE 190*   < > 164* 133* 235* 160* 127*  BUN 15   < > 16 16 13 12 12   CREATININE 0.64   < > 0.64 0.67 0.44* 0.40* 0.75  CALCIUM 9.6  --  9.7 9.5 8.8*  --  8.7*  MG  --   --   --  2.1  --   --  2.0   < > = values in this interval not displayed.   GFR: Estimated Creatinine Clearance: 129.8 mL/min (by C-G formula based on SCr of 0.75 mg/dL). Liver Function Tests: Recent Labs  Lab 08/26/19 2044 08/27/19 0349 08/28/19 0305  AST 25  --  21  ALT 23  --  23  ALKPHOS 70  --  49  BILITOT 1.7* 1.5* 0.5  PROT 7.5  --  6.0*  ALBUMIN 3.9  --  3.0*   No results for input(s): LIPASE, AMYLASE in the last 168 hours. No results for input(s): AMMONIA in the last 168 hours. Coagulation Profile: No results for input(s): INR, PROTIME in the last 168 hours. Cardiac Enzymes: No results for input(s): CKTOTAL, CKMB, CKMBINDEX, TROPONINI in the last 168 hours. BNP (last 3 results) No results for input(s): PROBNP in the last 8760 hours. HbA1C: No results for input(s): HGBA1C in the last 72 hours. CBG: Recent Labs  Lab 08/27/19 2200 08/27/19 2300 08/28/19 0004 08/28/19 0154 08/28/19 0404  GLUCAP 180* 156* 166* 158* 124*   Lipid Profile: Recent Labs    08/27/19 0203 08/28/19 0305  CHOL 536*  --   HDL NOT REPORTED DUE TO HIGH TRIGLYCERIDES  --   LDLCALC UNABLE TO CALCULATE IF TRIGLYCERIDE OVER 400 mg/dL  --    TRIG 10/26/19* 1,093*  CHOLHDL NOT REPORTED DUE TO HIGH TRIGLYCERIDES  --   LDLDIRECT NOT CALCULATED  --    Thyroid Function Tests: No results for input(s): TSH, T4TOTAL, FREET4, T3FREE, THYROIDAB in the last 72 hours. Anemia Panel: No results for input(s): VITAMINB12, FOLATE, FERRITIN, TIBC, IRON, RETICCTPCT in the last 72 hours. Sepsis Labs: No results for input(s): PROCALCITON, LATICACIDVEN in the last 168 hours.  Recent Results (from the past 240 hour(s))  SARS CORONAVIRUS 2 (TAT 6-24 HRS) Nasopharyngeal Nasopharyngeal Swab     Status: None   Collection Time: 08/26/19 11:19 PM   Specimen: Nasopharyngeal Swab  Result Value Ref Range Status   SARS Coronavirus 2 NEGATIVE NEGATIVE Final    Comment: (NOTE) SARS-CoV-2 target nucleic acids are NOT DETECTED. The SARS-CoV-2 RNA is generally detectable in upper and lower respiratory specimens during the acute  phase of infection. Negative results do not preclude SARS-CoV-2 infection, do not rule out co-infections with other pathogens, and should not be used as the sole basis for treatment or other patient management decisions. Negative results must be combined with clinical observations, patient history, and epidemiological information. The expected result is Negative. Fact Sheet for Patients: HairSlick.no Fact Sheet for Healthcare Providers: quierodirigir.com This test is not yet approved or cleared by the [place] FDA and  has been authorized for detection and/or diagnosis of SARS-CoV-2 by FDA under an Emergency Use Authorization (EUA). This EUA will remain  in effect (meaning this test can be used) for the duration of the COVID-19 declaration under Section 56 4(b)(1) of the Act, 21 U.S.C. section 360bbb-3(b)(1), unless the authorization is terminated or revoked sooner. Performed at [facility], [address], [place], [place] [address]   MRSA PCR Screening      Status: None   Collection Time: 08/27/19  6:35 PM   Specimen: Nasal Mucosa; Nasopharyngeal  Result Value Ref Range Status   MRSA by PCR NEGATIVE NEGATIVE Final    Comment:        The GeneXpert MRSA Assay (FDA approved for NASAL specimens only), is one component of a comprehensive MRSA colonization surveillance program. It is not intended to diagnose MRSA infection nor to guide or monitor treatment for MRSA infections. Performed at [facility], [address] [place], [place] [address]          Radiology Studies: DG Chest 2 View  Result Date: 08/26/2019 CLINICAL DATA:  Chest pain intermittently for 1 month with dizziness and jaw pain as well EXAM: CHEST - 2 VIEW COMPARISON:  Radiograph 08/14/2019 FINDINGS: Few streaky atelectatic changes in the bases. No consolidation, features of edema, pneumothorax, or effusion. Pulmonary vascularity is normally distributed. The cardiomediastinal contours are unremarkable. No acute osseous or soft tissue abnormality. IMPRESSION: Atelectasis, otherwise no acute cardiopulmonary abnormality. Electronically Signed   By: Kreg Shropshire M.D.   On: 08/26/2019 21:05   CARDIAC CATHETERIZATION  Result Date: 08/27/2019  Prox RCA lesion is 95% stenosed.  Prox Cx lesion is 80% stenosed.  Prox Cx to Mid Cx lesion is 85% stenosed.  Mid Cx lesion is 95% stenosed.  Dist Cx-1 lesion is 90% stenosed.  Dist Cx-2 lesion is 50% stenosed.  Ost LAD to Prox LAD lesion is 75% stenosed.  1st Diag lesion is 90% stenosed.  Mid LAD lesion is 40% stenosed.  There is mild left ventricular systolic dysfunction.  The left ventricular ejection fraction is 45-50% by visual estimate.  Severe multivessel CAD with 75% eccentric near ostial LAD stenosis, diffuse 90% stenosis in the first diagonal branch of the LAD with 40% mid LAD stenosis; segmental high-grade multiple stenoses in a large left circumflex vessel with 80 and 85% proximal stenosis, 95% stenosis at the  bifurcation of a large marginal branch followed by 90% stenosis and distal 50% stenosis; large dominant RCA with 95% ulcerated plaque in the proximal to mid vessel. Mild LV dysfunction with EF estimated 45 to 50% and suggestion of mild distal inferior hypocontractility.  LVEDP 18 mm. RECOMMENDATION: Angiographic findings were reviewed with Dr. Herbie Baltimore who had seen the patient in cardiology consult.  The patient will be started on IV nitroglycerin drip.  He will to new amlodipine and beta-blocker therapy will be initiated.  Will change statin therapy to atorvastatin 80 mg.  We will add fenofibrate with his marked hypertriglyceridemia and also consider Vascepa post discharge.  He will need stabilization of his diabetic status.  Surgical consultation will be obtained in am.   NM Myocar Multi W/Spect W/Wall Motion / EF  Result Date: 08/27/2019  There was no ST segment deviation noted during stress.  Findings consistent with ischemia and prior myocardial infarction.  This is an intermediate risk study.  The left ventricular ejection fraction is mildly decreased (45-54%).  Large area of inferior lateal wall infarct at mid and basal level with small area of moderate apical ischemia EF 47% with inferior lateral wall hypokinesis        Scheduled Meds: . aspirin  81 mg Oral Daily  . busPIRone  10 mg Oral QHS  . divalproex  1,000 mg Oral QHS  . gabapentin  600 mg Oral QHS  . metoprolol tartrate  25 mg Oral BID  . mirtazapine  30 mg Oral QHS  . nicotine  21 mg Transdermal Daily  . sodium chloride flush  3 mL Intravenous Once  . sodium chloride flush  3 mL Intravenous Q12H  . sodium chloride flush  3 mL Intravenous Q12H  . venlafaxine  150 mg Oral [id number]   Continuous Infusions: . sodium chloride Stopped (08/27/19 1800)  . sodium chloride Stopped (08/27/19 2111)  . sodium chloride    . dextrose 5 % and 0.45% NaCl 75 mL/hr at 08/27/19 2100  . heparin 1,400 Units/hr (08/28/19 0948)  . insulin    .  nitroGLYCERIN 30 mcg/min (08/28/19 0850)          Aline August, MD [facility] 08/28/2019, 9:50 AM

## 2019-08-28 NOTE — Progress Notes (Addendum)
Progress Note  Patient Name: Jacob DrapeScott Charles Brady Date of Encounter: 08/28/2019  Primary Cardiologist: No primary care provider on file.   Subjective   Not feeling well this morning. Very anxious. Headache, neck pain and back pain.   Inpatient Medications    Scheduled Meds:  aspirin  81 mg Oral Daily   atorvastatin  80 mg Oral q1800   busPIRone  10 mg Oral QHS   divalproex  1,000 mg Oral QHS   fenofibrate  160 mg Oral Daily   gabapentin  600 mg Oral QHS   metoprolol tartrate  25 mg Oral BID   mirtazapine  30 mg Oral QHS   nicotine  21 mg Transdermal Daily   sodium chloride flush  3 mL Intravenous Once   sodium chloride flush  3 mL Intravenous Q12H   sodium chloride flush  3 mL Intravenous Q12H   venlafaxine  150 mg Oral Daily   Continuous Infusions:  sodium chloride Stopped (08/27/19 1800)   sodium chloride Stopped (08/27/19 2111)   sodium chloride     dextrose 5 % and 0.45% NaCl 75 mL/hr at 08/27/19 2100   heparin 1,400 Units/hr (08/28/19 0948)   insulin 1.2 Units/hr (08/28/19 0956)   nitroGLYCERIN 30 mcg/min (08/28/19 0850)   PRN Meds: sodium chloride, acetaminophen, dextrose, diazepam, morphine injection, nitroGLYCERIN, ondansetron (ZOFRAN) IV, sodium chloride flush   Vital Signs    Vitals:   08/27/19 1900 08/27/19 2000 08/28/19 0000 08/28/19 0300  BP: 113/84 120/76 118/75 140/81  Pulse: 82 82 70 64  Resp: 16 17 18 15   Temp:  98 F (36.7 C)  97.7 F (36.5 C)  TempSrc:  Oral  Oral  SpO2:  95% 95% 95%  Weight:      Height:        Intake/Output Summary (Last 24 hours) at 08/28/2019 1013 Last data filed at 08/28/2019 0909 Gross per 24 hour  Intake 3085.46 ml  Output 2875 ml  Net 210.46 ml   Last 3 Weights 08/26/2019 07/23/2019 01/03/2019  Weight (lbs) 249 lb 1.9 oz 250 lb 275 lb  Weight (kg) 113 kg 113.399 kg 124.739 kg  Some encounter information is confidential and restricted. Go to Review Flowsheets activity to see all data.       Telemetry    SR - Personally Reviewed  ECG    SR with  - Personally Reviewed  Physical Exam  WM, laying in bed. Quite anxious.  GEN: No acute distress.   Neck: No JVD Cardiac: RRR, no murmurs, rubs, or gallops.  Respiratory: Clear to auscultation bilaterally. GI: Soft, nontender, non-distended  MS: No edema; No deformity. Right radial cath site stable.  Neuro:  Nonfocal  Psych: Normal affect   Labs    High Sensitivity Troponin:   Recent Labs  Lab 08/15/19 0003 08/26/19 2044 08/26/19 2228 08/27/19 0203 08/27/19 0349  TROPONINIHS 14 22* 23* 28* 30*      Chemistry Recent Labs  Lab 08/26/19 2044 08/26/19 2233 08/27/19 0349 08/27/19 0349 08/27/19 0813 08/27/19 0813 08/27/19 1321 08/27/19 1729 08/28/19 0305  NA 128*   < > 135   < > 138   < > 131* 132* 134*  K 4.9   < > 4.8   < > 4.3   < > 3.6 3.4* 3.6  CL 92*   < > 100   < > 104   < > 97* 101 102  CO2 16*   < > 21*   < > 21*  --  20*  --  21*  GLUCOSE 639*   < > 164*   < > 133*   < > 235* 160* 127*  BUN 18   < > 16   < > 16   < > 13 12 12   CREATININE 1.01   < > 0.64   < > 0.67   < > 0.44* 0.40* 0.75  CALCIUM 10.4*   < > 9.7   < > 9.5  --  8.8*  --  8.7*  PROT 7.5  --   --   --   --   --   --   --  6.0*  ALBUMIN 3.9  --   --   --   --   --   --   --  3.0*  AST 25  --   --   --   --   --   --   --  21  ALT 23  --   --   --   --   --   --   --  23  ALKPHOS 70  --   --   --   --   --   --   --  49  BILITOT 1.7*  --  1.5*  --   --   --   --   --  0.5  GFRNONAA >60   < > >60   < > >60  --  >60  --  >60  GFRAA >60   < > >60   < > >60  --  >60  --  >60  ANIONGAP 20*   < > 14   < > 13  --  14  --  11   < > = values in this interval not displayed.     Hematology Recent Labs  Lab 08/26/19 2044 08/26/19 2044 08/26/19 2233 08/27/19 1729 08/28/19 0305  WBC 9.8  --   --   --  9.9  RBC 5.22  --   --   --  4.64  HGB 17.2*   < > 16.7 14.3 14.4  HCT 47.2   < > 49.0 42.0 42.4  MCV 90.4  --   --   --  91.4  MCH  33.0  --   --   --  31.0  MCHC 36.4*  --   --   --  34.0  RDW 13.1  --   --   --  13.6  PLT 254  --   --   --  193   < > = values in this interval not displayed.    BNPNo results for input(s): BNP, PROBNP in the last 168 hours.   DDimer  Recent Labs  Lab 08/27/19 0349  DDIMER 0.60*     Radiology    DG Chest 2 View  Result Date: 08/26/2019 CLINICAL DATA:  Chest pain intermittently for 1 month with dizziness and jaw pain as well EXAM: CHEST - 2 VIEW COMPARISON:  Radiograph 08/14/2019 FINDINGS: Few streaky atelectatic changes in the bases. No consolidation, features of edema, pneumothorax, or effusion. Pulmonary vascularity is normally distributed. The cardiomediastinal contours are unremarkable. No acute osseous or soft tissue abnormality. IMPRESSION: Atelectasis, otherwise no acute cardiopulmonary abnormality. Electronically Signed   By: 08/16/2019 M.D.   On: 08/26/2019 21:05   CARDIAC CATHETERIZATION  Result Date: 08/27/2019  Prox RCA lesion is 95% stenosed.  Prox Cx lesion is 80% stenosed.  Prox Cx to Mid Cx  lesion is 85% stenosed.  Mid Cx lesion is 95% stenosed.  Dist Cx-1 lesion is 90% stenosed.  Dist Cx-2 lesion is 50% stenosed.  Ost LAD to Prox LAD lesion is 75% stenosed.  1st Diag lesion is 90% stenosed.  Mid LAD lesion is 40% stenosed.  There is mild left ventricular systolic dysfunction.  The left ventricular ejection fraction is 45-50% by visual estimate.  Severe multivessel CAD with 75% eccentric near ostial LAD stenosis, diffuse 90% stenosis in the first diagonal branch of the LAD with 40% mid LAD stenosis; segmental high-grade multiple stenoses in a large left circumflex vessel with 80 and 85% proximal stenosis, 95% stenosis at the bifurcation of a large marginal branch followed by 90% stenosis and distal 50% stenosis; large dominant RCA with 95% ulcerated plaque in the proximal to mid vessel. Mild LV dysfunction with EF estimated 45 to 50% and suggestion of mild  distal inferior hypocontractility.  LVEDP 18 mm. RECOMMENDATION: Angiographic findings were reviewed with Dr. Ellyn Hack who had seen the patient in cardiology consult.  The patient will be started on IV nitroglycerin drip.  He will to new amlodipine and beta-blocker therapy will be initiated.  Will change statin therapy to atorvastatin 80 mg.  We will add fenofibrate with his marked hypertriglyceridemia and also consider Vascepa post discharge.  He will need stabilization of his diabetic status.  Surgical consultation will be obtained in am.   NM Myocar Multi W/Spect W/Wall Motion / EF  Result Date: 08/27/2019  There was no ST segment deviation noted during stress.  Findings consistent with ischemia and prior myocardial infarction.  This is an intermediate risk study.  The left ventricular ejection fraction is mildly decreased (45-54%).  Large area of inferior lateal wall infarct at mid and basal level with small area of moderate apical ischemia EF 47% with inferior lateral wall hypokinesis    Cardiac Studies   Cath: 08/27/19   Prox RCA lesion is 95% stenosed.  Prox Cx lesion is 80% stenosed.  Prox Cx to Mid Cx lesion is 85% stenosed.  Mid Cx lesion is 95% stenosed.  Dist Cx-1 lesion is 90% stenosed.  Dist Cx-2 lesion is 50% stenosed.  Ost LAD to Prox LAD lesion is 75% stenosed.  1st Diag lesion is 90% stenosed.  Mid LAD lesion is 40% stenosed.  There is mild left ventricular systolic dysfunction.  The left ventricular ejection fraction is 45-50% by visual estimate.   Severe multivessel CAD with 75% eccentric near ostial LAD stenosis, diffuse 90% stenosis in the first diagonal branch of the LAD with 40% mid LAD stenosis; segmental high-grade multiple stenoses in a large left circumflex vessel with 80 and 85% proximal stenosis, 95% stenosis at the bifurcation of a large marginal branch followed by 90% stenosis and distal 50% stenosis; large dominant RCA with 95% ulcerated plaque in  the proximal to mid vessel.  Mild LV dysfunction with EF estimated 45 to 50% and suggestion of mild distal inferior hypocontractility.  LVEDP 18 mm.  RECOMMENDATION: Angiographic findings were reviewed with Dr. Ellyn Hack who had seen the patient in cardiology consult.  The patient will be started on IV nitroglycerin drip.  He will to new amlodipine and beta-blocker therapy will be initiated.  Will change statin therapy to atorvastatin 80 mg.  We will add fenofibrate with his marked hypertriglyceridemia and also consider Vascepa post discharge.  He will need stabilization of his diabetic status.  Surgical consultation will be obtained in am.  Diagnostic Dominance: Right  2D echo reviewed: EF 6065%.  Moderately increased LV posterior wall and severely increased basal septal wall.  GR 1 DD, impaired relaxation.  Normal RV size and function.  Normal right and left atria.  Essentially normal valves.  Patient Profile     59 y.o. male with a hx of poorly controlled insulin-dependent diabetes, hypertension, hyperlipidemia, major depressive disorder, PTSD, pancreatitis who was seen for the evaluation of chest pain at the request of Dr. Hanley Ben.  Assessment & Plan    1. Unstable Angina: No prior cardiac history but presented with exertional symptoms since December. High-sensitivity troponins mildly elevated and flat pattern 22>23>28>30. EKG nonischemic, mild nonspecific T wave inversions in V1-V3.  Abnormal R wave progression, early transition. -- Underwent stress testing which was abnormal and went for cardiac cath noted above with severe 3v disease. TCTS consulted. -- remains on IV heparin and nitro. ASA, statin, and BB -- echo pending   2. Hypertension: stable with current regimen  Would like to have him on an ARB prior to discharge.  We have started low-dose beta-blocker.  3. Hyperlipidemia: was on pravastatin 80 mg daily (reports compliance?) Triglycerides are significantly elevated,  >5000.  -- now on atorva 80mg  daily  4. Diabetes on insulin/DKA: Poorly controlled.  A1c 15.5 on 08/05/2019.  Had been 12.7 in 01/2019. -Current treatment for DKA by primary team, IV fluids and insulin drip.  5. Tobacco abuse: 1.5 pack/day smoker -Patient advised on cessation to reduce his future cardiac risk.  6. Major depressive disorder/PTSD: frustrated he has not received his home psych meds yet. PharmD will review and adjust appropriately.  7. Suicidal Ideation: he denies this today. Waiting to be cleared by psych.   For questions or updates, please contact [facility] Please consult [email or web address] for contact info under    Signed, 02/2019, NP  08/28/2019, 10:13 AM    ATTENDING ATTESTATION  I have seen, examined and evaluated the patient this AM along with 10/26/2019, NP-C.  After reviewing all the available data and chart, we discussed the patients laboratory, study & physical findings as well as symptoms in detail. I agree with her findings, examination as well as impression recommendations as per our discussion.    Jacob Brady was admitted with albeit atypical symptoms, clearly unstable angina based on the results of the nuclear stress test and cath findings.  I suspect that the perfusion defect on the Myoview was actually resting ischemia since there is no evidence of wall motion normality on echo to go along with a prior infarction.  This would suggest pretty good viability in the distribution.  I was present and reviewed the catheter films with Dr. Lorin Picket I totally agree with the plan that he needs bypass surgery but he will need to have his glycemic control stable as well as lipids.  We started him on high-dose statin and he probably will need a septoplasty minus fenofibrate.  Sherri feels better today.  Essentially, since starting the nitroglycerin and heparin he has not had any more chest pain.  He is pretty adamant about the fact that he is not suicidal he simply was  just at the point of hopelessness prior to his presentation.  He basically gave up on taking medications because he was not able to get from the [facility] and was not being seen to discuss his symptoms.  He felt like if he did not get seen soon he was lightly on a diet.  He does not have much social support at home  and his main support is actually the [place] [facility].  Unfortunately because of the suicide precautions he is not able to use his cell phone to talk to his Child psychotherapist.  We need to get this clarified so that he can actually talk to social worker prior to consideration of CABG.  His echo was personally reviewed on the bedside -> there is evidence of LVH with normal EF and no regional wall motion abnormalities.  Both atria are relatively normal.  Valves are also relatively normal.  We are currently awaiting CVTS consultation.  He is pretty much resolved itself to the fact he does need bypass surgery and understands after my discussion today.   Bryan Lemma, M.D., M.S. Interventional Cardiologist   Pager # [phone] Phone # [phone] [address] [place], [place] [address]

## 2019-08-28 NOTE — Progress Notes (Signed)
Per Psych note: "No evidence of imminent risk to self or others at present.   Patient does not meet criteria for psychiatric inpatient admission."  Discontinued suicide precautions.

## 2019-08-28 NOTE — Progress Notes (Addendum)
Inpatient Diabetes Program Recommendations  AACE/ADA: New Consensus Statement on Inpatient Glycemic Control (2015)  Target Ranges:  Prepandial:   less than 140 mg/dL      Peak postprandial:   less than 180 mg/dL (1-2 hours)      Critically ill patients:  140 - 180 mg/dL   Results for Jacob Brady, Jacob Brady (MRN [id number]) as of 08/28/2019 08:00  Ref. Range 08/26/2019 20:44  Glucose Latest Ref Range: 70 - 99 mg/dL [facility])   Results for Jacob Brady, Jacob Brady (MRN [id number]) as of 08/28/2019 08:14  Ref. Range 08/27/2019 22:00 08/27/2019 23:00 08/28/2019 00:04 08/28/2019 01:54 08/28/2019 04:04  Glucose-Capillary Latest Ref Range: 70 - 99 mg/dL 341 (H)  IV Insulin Drip 156 (H)  IV Insulin Drip 166 (H)  IV Insulin Drip 158 (H)  IV Insulin Drip 124 (H)  IV Insulin Drip OFF at 5:30am   Results for Jacob Brady, Jacob Brady (MRN [id number]) as of 08/28/2019 08:00  Ref. Range 08/05/2019 07:39  Hemoglobin A1C Latest Ref Range: 4.8 - 5.6 % 15.5 (H)    Admit with: CP/ Suicidal Ideation/ DKA  History: DM, Pancreatitis, PTSD, Depression  Home DM Meds: 70/30 Insulin 25 units BID  Current Orders: Novolog Sensitive Correction Scale/ SSI (0-9 units) TID AC + HS    Per notes, pt lacks transportation and has not been able to make follow up appointments at the [place].  Received call from RN caring for pt this AM--Per RN, IV Insulin Drip stopped at 5am.  RN concerned b/c no basal insulin administered prior to IV Insulin being stopped.    MD- Please consider starting a portion of pt's home 70/30 Insulin this AM now that IV Insulin Drip is off.  Recommend 70/30 Insulin 15 units BID with meals to start if plan is to leave pt off the IV Insulin Drip  However, note that TG level 1756 this AM--Perhaps it would be better to restart the IV Insulin drip to help further bring TG levels down??     --Will follow patient during hospitalization--  Ambrose Finland RN, MSN, CDE Diabetes Coordinator Inpatient  Glycemic Control Team Team Pager: [phone] (8a-5p)

## 2019-08-28 NOTE — Progress Notes (Signed)
  Echocardiogram 2D Echocardiogram has been performed.  Janalyn Harder 08/28/2019, 1:31 PM

## 2019-08-28 NOTE — Progress Notes (Signed)
TCTS consulted for CABG evaluation. °

## 2019-08-28 NOTE — Progress Notes (Signed)
Pre CABG exam completed.  Preliminary results can be found under CV proc under chart review.  08/28/2019 4:16 PM  Seva Chancy, K., RDMS, RVT

## 2019-08-28 NOTE — Progress Notes (Signed)
ANTICOAGULATION CONSULT NOTE   Pharmacy Consult for IV Heparin Indication: chest pain/ACS  Allergies  Allergen Reactions  . Hctz [Hydrochlorothiazide] Other (See Comments)    Unknown rxn, "they just told me not to take it anymore"  . Seroquel [Quetiapine Fumarate] Other (See Comments)    Elevates liver enzymes     Patient Measurements: Height: 5\' 10"  (177.8 cm) Weight: 249 lb 1.9 oz (113 kg) IBW/kg (Calculated) : 73 Heparin Dosing Weight: 97.8 kg  Vital Signs: Temp: 97.7 F (36.5 C) (02/02 0300) Temp Source: Oral (02/02 0300) BP: 140/81 (02/02 0300) Pulse Rate: 64 (02/02 0300)  Labs: Recent Labs    08/26/19 2044 08/26/19 2044 08/26/19 2228 08/26/19 2233 08/26/19 2233 08/27/19 0203 08/27/19 0203 08/27/19 0349 08/27/19 0813 08/27/19 1321 08/27/19 1729 08/28/19 0305 08/28/19 0818  HGB 17.2*   < >  --  16.7   < >  --   --   --   --   --  14.3 14.4  --   HCT 47.2   < >  --  49.0  --   --   --   --   --   --  42.0 42.4  --   PLT 254  --   --   --   --   --   --   --   --   --   --  193  --   HEPARINUNFRC  --   --   --   --   --   --   --   --   --   --   --   --  <0.10*  CREATININE 1.01   < >  --   --   --  0.64   < > 0.64   < > 0.44* 0.40* 0.75  --   TROPONINIHS 22*   < > 23*  --   --  28*  --  30*  --   --   --   --   --    < > = values in this interval not displayed.    Estimated Creatinine Clearance: 129.8 mL/min (by C-G formula based on SCr of 0.75 mg/dL).   Medical History: Past Medical History:  Diagnosis Date  . Arthritis   . Diabetes mellitus without complication (HCC)   . Hypertension   . Pancreatitis   . PTSD (post-traumatic stress disorder)     Assessment: 59 year old male now s/p cardiac cath found to have severe multi-vessel CAD with plan for CABG to start IV Heparin per pharmacy.   Heparin level undetectable this morning, hgb stable in 14s overnight. No bleeding or hematoma noted.   Goal of Therapy:  Heparin level 0.3-0.7  units/ml Monitor platelets by anticoagulation protocol: Yes   Plan:  Increase heparin to 1400 units/hr Recheck level in 6 hours  Monitor daily Heparin level and CBC while on therapy.  Monitor for signs and symptoms of bleeding.   61 PharmD., BCPS Clinical Pharmacist 08/28/2019 9:26 AM

## 2019-08-28 NOTE — Consult Note (Signed)
Telepsych Consultation   Reason for Consult: Depression, reported suicidal ideations Referring Physician:  Dr. Pauletta Browns  Location of Patient: [facility] 2 C  Location of Provider: [facility] TTS Department  Patient Identification: Jacob Brady MRN:  [id number] Principal Diagnosis: DKA (diabetic ketoacidoses) (HCC) Diagnosis:  Principal Problem:   DKA (diabetic ketoacidoses) (HCC) Active Problems:   Essential hypertension   Diabetes mellitus type 2 in obese [facility])   Chest pain   Suicide ideation   Unstable angina (HCC)   Total Time spent with patient: 30 minutes  Subjective:   Jacob Brady is a 59 y.o. male patient admitted with chest pain.  HPI: 59 year old male, divorced, lives alone.  Presented to hospital due to intermittent severe chest pain often associated with dizziness. He states he had been experiencing these pains for several weeks and describes them as intense and debilitating.  He reports that due to Covid epidemic it has been harder for him to seek outpatient treatment in part because transportation (patient does not drive and is dependent for transportation on a [employer] disabled [employer] ) has been more difficult to obtain. He acknowledges that in the context of the severity of pain and the frustration he felt regarding being unable to get resources/management for this issue he felt increasingly depressed and suicidal. (Chart notes indicate that he had recently thought of shooting himself). Patient was admitted to medical unit due to poorly controlled diabetes/DKA and elevated troponin/ chest pains as described.  Evaluated by cardiology, underwent cardiac cath on 2/1 which showed severe multivessel CAD ,diagnosed with unstable angina, now scheduled for CABG on 2/4. Patient states , regarding his depression, "I am feeling a whole lot better" and states he is no longer feeling  depressed.  Now I know why I am having chest pains and that something can be  done about it".  He acknowledges some apprehension regarding upcoming surgery but states that generally he is feeling a sense of relief that there is a diagnosis and proposed treatment.  Currently denies suicidal ideations.    Past Psychiatric History: Reports history of depression and Bipolar Disorder/PTSD diagnosis in the past .  Reports of prior history of psychiatric hospitalizations in 2008 and 2013 for depression at which time he was experiencing marital difficulties. He reports she has been on BuSpar 10 mg nightly, Effexor 150 mg daily, Remeron 30 mg nightly, Depakote 1000 mgrs QHS for a period of several years.  States that none of these medications are new and that he tolerates them well/feels they have been effective.  Risk to Self:  Currently denies SI Risk to Others:  Denies HI or violent ideations Prior Inpatient Therapy:  As above Prior Outpatient Therapy:  As above  Past Medical History:  Past Medical History:  Diagnosis Date  . Arthritis   . Diabetes mellitus without complication (HCC)   . Hypertension   . Pancreatitis   . PTSD (post-traumatic stress disorder)     Past Surgical History:  Procedure Laterality Date  . CHOLECYSTECTOMY    . LEFT HEART CATH AND CORONARY ANGIOGRAPHY N/A 08/27/2019   Procedure: LEFT HEART CATH AND CORONARY ANGIOGRAPHY;  Surgeon: Lennette Bihari, MD;  Location: [facility] INVASIVE CV LAB;  Service: Cardiovascular;  Laterality: N/A;  . WRIST FRACTURE SURGERY Right    Family History:  Family History  Problem Relation Age of Onset  . Aneurysm Mother        Brain  . Brain cancer Father 77  . Healthy Sister   .  Healthy Brother   . Healthy Sister   . Healthy Sister    Family Psychiatric  History:  Social History:  Social History   Substance and Sexual Activity  Alcohol Use No     Social History   Substance and Sexual Activity  Drug Use No    Social History   Socioeconomic History  . Marital status: Divorced    Spouse name: Not on file   . Number of children: Not on file  . Years of education: Not on file  . Highest education level: Not on file  Occupational History  . Occupation: disability  Tobacco Use  . Smoking status: Current Every Day Smoker    Packs/day: 1.50    Years: 35.00    Pack years: 52.50  . Smokeless tobacco: Never Used  Substance and Sexual Activity  . Alcohol use: No  . Drug use: No  . Sexual activity: Not Currently  Other Topics Concern  . Not on file  Social History Narrative  . Not on file   Social Determinants of Health   Financial Resource Strain:   . Difficulty of Paying Living Expenses: Not on file  Food Insecurity:   . Worried About Charity fundraiser in the Last Year: Not on file  . Ran Out of Food in the Last Year: Not on file  Transportation Needs:   . Lack of Transportation (Medical): Not on file  . Lack of Transportation (Non-Medical): Not on file  Physical Activity:   . Days of Exercise per Week: Not on file  . Minutes of Exercise per Session: Not on file  Stress:   . Feeling of Stress : Not on file  Social Connections:   . Frequency of Communication with Friends and Family: Not on file  . Frequency of Social Gatherings with Friends and Family: Not on file  . Attends Religious Services: Not on file  . Active Member of Clubs or Organizations: Not on file  . Attends Archivist Meetings: Not on file  . Marital Status: Not on file   Additional Social History:    Allergies:   Allergies  Allergen Reactions  . Hctz [Hydrochlorothiazide] Other (See Comments)    Unknown rxn, "they just told me not to take it anymore"  . Seroquel [Quetiapine Fumarate] Other (See Comments)    Elevates liver enzymes     Labs:  Results for orders placed or performed during the hospital encounter of 08/26/19 (from the past 48 hour(s))  CBC     Status: Abnormal   Collection Time: 08/26/19  8:44 PM  Result Value Ref Range   WBC 9.8 4.0 - 10.5 K/uL   RBC 5.22 4.22 - 5.81 MIL/uL    Hemoglobin 17.2 (H) 13.0 - 17.0 g/dL   HCT 47.2 39.0 - 52.0 %   MCV 90.4 80.0 - 100.0 fL   MCH 33.0 26.0 - 34.0 pg   MCHC 36.4 (H) 30.0 - 36.0 g/dL   RDW 13.1 11.5 - 15.5 %   Platelets 254 150 - 400 K/uL   nRBC 0.0 0.0 - 0.2 %    Comment: Performed at [facility], [place] [address], [place], [place] [address]  Troponin I (High Sensitivity)     Status: Abnormal   Collection Time: 08/26/19  8:44 PM  Result Value Ref Range   Troponin I (High Sensitivity) 22 (H) <18 ng/L    Comment: Performed at [facility] [address], [place], [place] [address]  Acetaminophen level     Status: Abnormal   Collection Time: 08/26/19  8:44 PM  Result Value Ref Range   Acetaminophen (Tylenol), Serum <10 (L) 10 - 30 ug/mL    Comment: (NOTE) Therapeutic concentrations vary significantly. A range of 10-30 ug/mL  may be an effective concentration for many patients. However, some  are best treated at concentrations outside of this range. Acetaminophen concentrations >150 ug/mL at 4 hours after ingestion  and >50 ug/mL at 12 hours after ingestion are often associated with  toxic reactions. Performed at [facility], [address] [place], [place] [address]   Salicylate level     Status: Abnormal   Collection Time: 08/26/19  8:44 PM  Result Value Ref Range   Salicylate Lvl <7.0 (L) 7.0 - 30.0 mg/dL    Comment: Performed at [facility], [address], [place], [place] [address]  Comprehensive metabolic panel     Status: Abnormal   Collection Time: 08/26/19  8:44 PM  Result Value Ref Range   Sodium 128 (L) 135 - 145 mmol/L    Comment: POST-ULTRACENTRIFUGATION   Potassium 4.9 3.5 - 5.1 mmol/L    Comment: SLIGHT HEMOLYSIS   Chloride 92 (L) 98 - 111 mmol/L   CO2 16 (L) 22 - 32 mmol/L   Glucose, Bld 639 (HH) 70 - 99 mg/dL    Comment: CRITICAL RESULT CALLED TO, READ BACK BY AND VERIFIED WITH: DENNIS A,RN 08/26/19 2157 WAYK    BUN 18 6 - 20 mg/dL   Creatinine, Ser 8.29 0.61  - 1.24 mg/dL   Calcium 56.2 (H) 8.9 - 10.3 mg/dL   Total Protein 7.5 6.5 - 8.1 g/dL   Albumin 3.9 3.5 - 5.0 g/dL   AST 25 15 - 41 U/L   ALT 23 0 - 44 U/L   Alkaline Phosphatase 70 38 - 126 U/L   Total Bilirubin 1.7 (H) 0.3 - 1.2 mg/dL   GFR calc non Af Amer >60 >60 mL/min   GFR calc Af Amer >60 >60 mL/min   Anion gap 20 (H) 5 - 15    Comment: Performed at [facility], [address], [place], [place] [address]  Rapid urine drug screen (hospital performed)     Status: None   Collection Time: 08/26/19  9:40 PM  Result Value Ref Range   Opiates NONE DETECTED NONE DETECTED   Cocaine NONE DETECTED NONE DETECTED   Benzodiazepines NONE DETECTED NONE DETECTED   Amphetamines NONE DETECTED NONE DETECTED   Tetrahydrocannabinol NONE DETECTED NONE DETECTED   Barbiturates NONE DETECTED NONE DETECTED    Comment: (NOTE) DRUG SCREEN FOR MEDICAL PURPOSES ONLY.  IF CONFIRMATION IS NEEDED FOR ANY PURPOSE, NOTIFY LAB WITHIN 5 DAYS. LOWEST DETECTABLE LIMITS FOR URINE DRUG SCREEN Drug Class                     Cutoff (ng/mL) Amphetamine and metabolites    1000 Barbiturate and metabolites    200 Benzodiazepine                 200 Tricyclics and metabolites     300 Opiates and metabolites        300 Cocaine and metabolites        300 THC                            50 Performed at [facility], [address] [place], [place] [address]   Troponin I (High Sensitivity)     Status: Abnormal   Collection Time: 08/26/19 10:28 PM  Result Value Ref Range   Troponin I (High Sensitivity) 23 (H) <18 ng/L    Comment: (NOTE) Elevated high sensitivity troponin I (hsTnI) values and significant  changes across serial measurements may suggest ACS but many other  chronic and acute conditions are known to elevate hsTnI results.  Refer to the "Links" section for chest pain algorithms and additional  guidance. Performed at [facility], [address] [place], [place] [address]    Beta-hydroxybutyric acid     Status: Abnormal   Collection Time: 08/26/19 10:28 PM  Result Value Ref Range   Beta-Hydroxybutyric Acid 0.91 (H) 0.05 - 0.27 mmol/L    Comment: Performed at [facility], [address], [place], [place] [address]  POCT I-Stat EG7     Status: Abnormal   Collection Time: 08/26/19 10:33 PM  Result Value Ref Range   pH, Ven 7.431 (H) 7.250 - 7.430   pCO2, Ven 42.6 (L) 44.0 - 60.0 mmHg   pO2, Ven 24.0 (LL) 32.0 - 45.0 mmHg   Bicarbonate 28.4 (H) 20.0 - 28.0 mmol/L   TCO2 30 22 - 32 mmol/L   O2 Saturation 43.0 %   Acid-Base Excess 3.0 (H) 0.0 - 2.0 mmol/L   Sodium 126 (L) 135 - 145 mmol/L   Potassium 5.3 (H) 3.5 - 5.1 mmol/L   Calcium, Ion 1.26 1.15 - 1.40 mmol/L   HCT 49.0 39.0 - 52.0 %   Hemoglobin 16.7 13.0 - 17.0 g/dL   Patient temperature HIDE    Sample type VENOUS    Comment NOTIFIED PHYSICIAN   SARS CORONAVIRUS 2 (TAT 6-24 HRS) Nasopharyngeal Nasopharyngeal Swab     Status: None   Collection Time: 08/26/19 11:19 PM   Specimen: Nasopharyngeal Swab  Result Value Ref Range   SARS Coronavirus 2 NEGATIVE NEGATIVE    Comment: (NOTE) SARS-CoV-2 target nucleic acids are NOT DETECTED. The SARS-CoV-2 RNA is generally detectable in upper and lower respiratory specimens during the acute phase of infection. Negative results do not preclude SARS-CoV-2 infection, do not rule out co-infections with other pathogens, and should not be used as the sole basis for treatment or other patient management decisions. Negative results must be combined with clinical observations, patient history, and epidemiological information. The expected result is Negative. Fact Sheet for Patients: HairSlick.no Fact Sheet for Healthcare Providers: quierodirigir.com This test is not yet approved or cleared by the [place] FDA and  has been authorized for detection and/or diagnosis of SARS-CoV-2 by FDA under an  Emergency Use Authorization (EUA). This EUA will remain  in effect (meaning this test can be used) for the duration of the COVID-19 declaration under Section 56 4(b)(1) of the Act, 21 U.S.C. section 360bbb-3(b)(1), unless the authorization is terminated or revoked sooner. Performed at [facility], [address] [place], [place] [address]   CBG monitoring, ED     Status: Abnormal   Collection Time: 08/26/19 11:30 PM  Result Value Ref Range   Glucose-Capillary 562 (HH) 70 - 99 mg/dL   Comment 1 Notify RN   Urinalysis, Routine w reflex microscopic     Status: Abnormal   Collection Time: 08/26/19 11:33 PM  Result Value Ref Range   Color, Urine STRAW (A) YELLOW   APPearance CLEAR CLEAR   Specific Gravity, Urine 1.026 1.005 - 1.030   pH 6.0  5.0 - 8.0   Glucose, UA >=500 (A) NEGATIVE mg/dL   Hgb urine dipstick NEGATIVE NEGATIVE   Bilirubin Urine NEGATIVE NEGATIVE   Ketones, ur 20 (A) NEGATIVE mg/dL   Protein, ur NEGATIVE NEGATIVE mg/dL   Nitrite NEGATIVE NEGATIVE   Leukocytes,Ua NEGATIVE NEGATIVE   RBC / HPF 0-5 0 - 5 RBC/hpf   WBC, UA 0-5 0 - 5 WBC/hpf   Bacteria, UA NONE SEEN NONE SEEN    Comment: Performed at [facility], [address] [place], [place] [address]  CBG monitoring, ED     Status: Abnormal   Collection Time: 08/26/19 11:56 PM  Result Value Ref Range   Glucose-Capillary 527 (HH) 70 - 99 mg/dL   Comment 1 Notify RN   CBG monitoring, ED     Status: Abnormal   Collection Time: 08/27/19 12:35 AM  Result Value Ref Range   Glucose-Capillary 447 (H) 70 - 99 mg/dL  CBG monitoring, ED     Status: Abnormal   Collection Time: 08/27/19  1:14 AM  Result Value Ref Range   Glucose-Capillary 269 (H) 70 - 99 mg/dL  Lipid panel     Status: Abnormal   Collection Time: 08/27/19  2:03 AM  Result Value Ref Range   Cholesterol 536 (H) 0 - 200 mg/dL    Comment: CORRECTED ON 02/01 AT 0324: PREVIOUSLY REPORTED AS 307   Triglycerides 5,619 (H) <150 mg/dL    Comment:  RESULTS CONFIRMED BY MANUAL DILUTION CORRECTED ON 02/01 AT 0405: PREVIOUSLY REPORTED AS 1580 RESULTS CONFIRMED BY MANUAL DILUTION    HDL NOT REPORTED DUE TO HIGH TRIGLYCERIDES >40 mg/dL   Total CHOL/HDL Ratio NOT REPORTED DUE TO HIGH TRIGLYCERIDES RATIO   VLDL UNABLE TO CALCULATE IF TRIGLYCERIDE OVER 400 mg/dL 0 - 40 mg/dL   LDL Cholesterol UNABLE TO CALCULATE IF TRIGLYCERIDE OVER 400 mg/dL 0 - 99 mg/dL    Comment: Performed at [facility], [address] [place], [place] [address] CORRECTED ON 02/01 AT 0405: PREVIOUSLY REPORTED AS NOT CALCULATED   Basic metabolic panel     Status: Abnormal   Collection Time: 08/27/19  2:03 AM  Result Value Ref Range   Sodium 132 (L) 135 - 145 mmol/L   Potassium 4.8 3.5 - 5.1 mmol/L   Chloride 101 98 - 111 mmol/L   CO2 16 (L) 22 - 32 mmol/L   Glucose, Bld 190 (H) 70 - 99 mg/dL   BUN 15 6 - 20 mg/dL   Creatinine, Ser 9.14 0.61 - 1.24 mg/dL   Calcium 9.6 8.9 - 78.2 mg/dL   GFR calc non Af Amer >60 >60 mL/min   GFR calc Af Amer >60 >60 mL/min   Anion gap 15 5 - 15    Comment: Performed at [facility], [address] [place], [place] [address]  Troponin I (High Sensitivity)     Status: Abnormal   Collection Time: 08/27/19  2:03 AM  Result Value Ref Range   Troponin I (High Sensitivity) 28 (H) <18 ng/L    Comment: (NOTE) Elevated high sensitivity troponin I (hsTnI) values and significant  changes across serial measurements may suggest ACS but many other  chronic and acute conditions are known to elevate hsTnI results.  Refer to the Links section for chest pain algorithms and additional  guidance. Performed at [facility], [address], [place], [place] [address]   LDL cholesterol, direct     Status: None   Collection Time:  08/27/19  2:03 AM  Result Value Ref Range   Direct LDL NOT CALCULATED 0 - 99 mg/dL    Comment: Performed at [facility], [address] [place], [place] [address]  CBG monitoring, ED      Status: Abnormal   Collection Time: 08/27/19  2:32 AM  Result Value Ref Range   Glucose-Capillary 211 (H) 70 - 99 mg/dL  Osmolality     Status: Abnormal   Collection Time: 08/27/19  2:55 AM  Result Value Ref Range   Osmolality 296 (H) 275 - 295 mOsm/kg    Comment: Performed at [facility], [address] [place], [place] [address]  CBG monitoring, ED     Status: Abnormal   Collection Time: 08/27/19  3:48 AM  Result Value Ref Range   Glucose-Capillary 178 (H) 70 - 99 mg/dL  Basic metabolic panel     Status: Abnormal   Collection Time: 08/27/19  3:49 AM  Result Value Ref Range   Sodium 135 135 - 145 mmol/L    Comment: POST-ULTRACENTRIFUGATION   Potassium 4.8 3.5 - 5.1 mmol/L    Comment: SPECIMEN HEMOLYZED. HEMOLYSIS MAY AFFECT INTEGRITY OF RESULTS.   Chloride 100 98 - 111 mmol/L   CO2 21 (L) 22 - 32 mmol/L   Glucose, Bld 164 (H) 70 - 99 mg/dL   BUN 16 6 - 20 mg/dL   Creatinine, Ser 3.870.64 0.61 - 1.24 mg/dL   Calcium 9.7 8.9 - 56.410.3 mg/dL   GFR calc non Af Amer >60 >60 mL/min   GFR calc Af Amer >60 >60 mL/min   Anion gap 14 5 - 15    Comment: Performed at [facility], [address] [place], [place] [address]  Bilirubin, fractionated(tot/dir/indir)     Status: Abnormal   Collection Time: 08/27/19  3:49 AM  Result Value Ref Range   Total Bilirubin 1.5 (H) 0.3 - 1.2 mg/dL   Bilirubin, Direct 0.6 (H) 0.0 - 0.2 mg/dL   Indirect Bilirubin 0.9 0.3 - 0.9 mg/dL    Comment: Performed at [facility], [address] [place], [place] [address]  Troponin I (High Sensitivity)     Status: Abnormal   Collection Time: 08/27/19  3:49 AM  Result Value Ref Range   Troponin I (High Sensitivity) 30 (H) <18 ng/L    Comment: (NOTE) Elevated high sensitivity troponin I (hsTnI) values and significant  changes across serial measurements may suggest ACS but many other  chronic and acute conditions are known to elevate hsTnI results.  Refer to the "Links" section for chest pain  algorithms and additional  guidance. Performed at [facility], [address] [place], [place] [address]   D-dimer, quantitative (not at [facility])     Status: Abnormal   Collection Time: 08/27/19  3:49 AM  Result Value Ref Range   D-Dimer, Quant 0.60 (H) 0.00 - 0.50 ug/mL-FEU    Comment: (NOTE) At the manufacturer cut-off of 0.50 ug/mL FEU, this assay has been documented to exclude PE with a sensitivity and negative predictive value of 97 to 99%.  At this time, this assay has not been approved by the FDA to exclude DVT/VTE. Results should be correlated with clinical presentation. Performed at [facility], [address] [place], [place] [address]   CBG monitoring, ED     Status: Abnormal   Collection Time: 08/27/19  4:57 AM  Result Value Ref Range   Glucose-Capillary 164 (H) 70 -  99 mg/dL  CBG monitoring, ED     Status: Abnormal   Collection Time: 08/27/19  6:18 AM  Result Value Ref Range   Glucose-Capillary 175 (H) 70 - 99 mg/dL  CBG monitoring, ED     Status: Abnormal   Collection Time: 08/27/19  6:53 AM  Result Value Ref Range   Glucose-Capillary 171 (H) 70 - 99 mg/dL  Basic metabolic panel     Status: Abnormal   Collection Time: 08/27/19  8:13 AM  Result Value Ref Range   Sodium 138 135 - 145 mmol/L    Comment: POST-ULTRACENTRIFUGATION   Potassium 4.3 3.5 - 5.1 mmol/L    Comment: SPECIMEN HEMOLYZED. HEMOLYSIS MAY AFFECT INTEGRITY OF RESULTS.   Chloride 104 98 - 111 mmol/L   CO2 21 (L) 22 - 32 mmol/L   Glucose, Bld 133 (H) 70 - 99 mg/dL   BUN 16 6 - 20 mg/dL   Creatinine, Ser 5.40 0.61 - 1.24 mg/dL   Calcium 9.5 8.9 - 98.1 mg/dL   GFR calc non Af Amer >60 >60 mL/min   GFR calc Af Amer >60 >60 mL/min   Anion gap 13 5 - 15    Comment: Performed at [facility], [address] [place], [place] [address]  Magnesium     Status: None   Collection Time: 08/27/19  8:13 AM  Result Value Ref Range   Magnesium 2.1 1.7 - 2.4 mg/dL    Comment: Performed at  [facility], [address], [place], [place] [address]  CBG monitoring, ED     Status: Abnormal   Collection Time: 08/27/19  8:36 AM  Result Value Ref Range   Glucose-Capillary 137 (H) 70 - 99 mg/dL  CBG monitoring, ED     Status: Abnormal   Collection Time: 08/27/19 11:56 AM  Result Value Ref Range   Glucose-Capillary 229 (H) 70 - 99 mg/dL  CBG monitoring, ED     Status: Abnormal   Collection Time: 08/27/19  1:02 PM  Result Value Ref Range   Glucose-Capillary 260 (H) 70 - 99 mg/dL  Basic metabolic panel     Status: Abnormal   Collection Time: 08/27/19  1:21 PM  Result Value Ref Range   Sodium 131 (L) 135 - 145 mmol/L   Potassium 3.6 3.5 - 5.1 mmol/L   Chloride 97 (L) 98 - 111 mmol/L   CO2 20 (L) 22 - 32 mmol/L   Glucose, Bld 235 (H) 70 - 99 mg/dL   BUN 13 6 - 20 mg/dL   Creatinine, Ser 2.13 (L) 0.61 - 1.24 mg/dL   Calcium 8.8 (L) 8.9 - 10.3 mg/dL   GFR calc non Af Amer >60 >60 mL/min   GFR calc Af Amer >60 >60 mL/min   Anion gap 14 5 - 15    Comment: Performed at [facility], [address], [place], [place] [address]  CBG monitoring, ED     Status: Abnormal   Collection Time: 08/27/19  2:51 PM  Result Value Ref Range   Glucose-Capillary 135 (H) 70 - 99 mg/dL  Glucose, capillary     Status: None   Collection Time: 08/27/19  4:20 PM  Result Value Ref Range   Glucose-Capillary 94 70 - 99 mg/dL  I-STAT, chem 8     Status: Abnormal   Collection Time: 08/27/19  5:29 PM  Result Value Ref Range   Sodium 132 (L) 135 - 145 mmol/L   Potassium 3.4 (L) 3.5 -  5.1 mmol/L   Chloride 101 98 - 111 mmol/L   BUN 12 6 - 20 mg/dL   Creatinine, Ser 1.61 (L) 0.61 - 1.24 mg/dL   Glucose, Bld 096 (H) 70 - 99 mg/dL   Calcium, Ion 0.45 4.09 - 1.40 mmol/L   TCO2 24 22 - 32 mmol/L   Hemoglobin 14.3 13.0 - 17.0 g/dL   HCT 81.1 91.4 - 78.2 %  Glucose, capillary     Status: Abnormal   Collection Time: 08/27/19  5:56 PM  Result Value Ref Range   Glucose-Capillary 200 (H) 70 - 99  mg/dL   Comment 1 Notify RN    Comment 2 Document in Chart   MRSA PCR Screening     Status: None   Collection Time: 08/27/19  6:35 PM   Specimen: Nasal Mucosa; Nasopharyngeal  Result Value Ref Range   MRSA by PCR NEGATIVE NEGATIVE    Comment:        The GeneXpert MRSA Assay (FDA approved for NASAL specimens only), is one component of a comprehensive MRSA colonization surveillance program. It is not intended to diagnose MRSA infection nor to guide or monitor treatment for MRSA infections. Performed at [facility], [address] [place], [place] [address]   Glucose, capillary     Status: Abnormal   Collection Time: 08/27/19  7:01 PM  Result Value Ref Range   Glucose-Capillary 189 (H) 70 - 99 mg/dL  Glucose, capillary     Status: Abnormal   Collection Time: 08/27/19  8:04 PM  Result Value Ref Range   Glucose-Capillary 242 (H) 70 - 99 mg/dL  Glucose, capillary     Status: Abnormal   Collection Time: 08/27/19  9:10 PM  Result Value Ref Range   Glucose-Capillary 225 (H) 70 - 99 mg/dL   Comment 1 Notify RN   Glucose, capillary     Status: Abnormal   Collection Time: 08/27/19 10:00 PM  Result Value Ref Range   Glucose-Capillary 180 (H) 70 - 99 mg/dL   Comment 1 Notify RN   Glucose, capillary     Status: Abnormal   Collection Time: 08/27/19 11:00 PM  Result Value Ref Range   Glucose-Capillary 156 (H) 70 - 99 mg/dL  Glucose, capillary     Status: Abnormal   Collection Time: 08/28/19 12:04 AM  Result Value Ref Range   Glucose-Capillary 166 (H) 70 - 99 mg/dL  Glucose, capillary     Status: Abnormal   Collection Time: 08/28/19  1:54 AM  Result Value Ref Range   Glucose-Capillary 158 (H) 70 - 99 mg/dL  CBC with Differential/Platelet     Status: Abnormal   Collection Time: 08/28/19  3:05 AM  Result Value Ref Range   WBC 9.9 4.0 - 10.5 K/uL   RBC 4.64 4.22 - 5.81 MIL/uL   Hemoglobin 14.4 13.0 - 17.0 g/dL   HCT 30.8 65.7 - 84.6 %   MCV 91.4 80.0 - 100.0 fL   MCH 31.0  26.0 - 34.0 pg   MCHC 34.0 30.0 - 36.0 g/dL   RDW 96.2 95.2 - 84.1 %   Platelets 193 150 - 400 K/uL   nRBC 0.0 0.0 - 0.2 %   Neutrophils Relative % 42 %   Neutro Abs 4.2 1.7 - 7.7 K/uL   Lymphocytes Relative 45 %   Lymphs Abs 4.5 (H) 0.7 - 4.0 K/uL   Monocytes Relative 8 %   Monocytes Absolute 0.8 0.1 - 1.0 K/uL   Eosinophils Relative 3 %  Eosinophils Absolute 0.3 0.0 - 0.5 K/uL   Basophils Relative 1 %   Basophils Absolute 0.1 0.0 - 0.1 K/uL   Immature Granulocytes 1 %   Abs Immature Granulocytes 0.06 0.00 - 0.07 K/uL    Comment: Performed at [facility], [address] [place], [place] [address]  Comprehensive metabolic panel     Status: Abnormal   Collection Time: 08/28/19  3:05 AM  Result Value Ref Range   Sodium 134 (L) 135 - 145 mmol/L    Comment: POST-ULTRACENTRIFUGATION   Potassium 3.6 3.5 - 5.1 mmol/L   Chloride 102 98 - 111 mmol/L   CO2 21 (L) 22 - 32 mmol/L   Glucose, Bld 127 (H) 70 - 99 mg/dL   BUN 12 6 - 20 mg/dL   Creatinine, Ser 6.04 0.61 - 1.24 mg/dL   Calcium 8.7 (L) 8.9 - 10.3 mg/dL   Total Protein 6.0 (L) 6.5 - 8.1 g/dL   Albumin 3.0 (L) 3.5 - 5.0 g/dL   AST 21 15 - 41 U/L   ALT 23 0 - 44 U/L   Alkaline Phosphatase 49 38 - 126 U/L   Total Bilirubin 0.5 0.3 - 1.2 mg/dL   GFR calc non Af Amer >60 >60 mL/min   GFR calc Af Amer >60 >60 mL/min   Anion gap 11 5 - 15    Comment: Performed at [facility], [address] [place], [place] [address]  Magnesium     Status: None   Collection Time: 08/28/19  3:05 AM  Result Value Ref Range   Magnesium 2.0 1.7 - 2.4 mg/dL    Comment: Performed at [facility], [address] [place], [place] [address]  Triglycerides     Status: Abnormal   Collection Time: 08/28/19  3:05 AM  Result Value Ref Range   Triglycerides 1,756 (H) <150 mg/dL    Comment: RESULTS CONFIRMED BY MANUAL DILUTION Performed at [facility], [address], [place], [place] [address]   Glucose, capillary     Status:  Abnormal   Collection Time: 08/28/19  4:04 AM  Result Value Ref Range   Glucose-Capillary 124 (H) 70 - 99 mg/dL  Glucose, capillary     Status: Abnormal   Collection Time: 08/28/19  5:06 AM  Result Value Ref Range   Glucose-Capillary 113 (H) 70 - 99 mg/dL  Glucose, capillary     Status: Abnormal   Collection Time: 08/28/19  6:29 AM  Result Value Ref Range   Glucose-Capillary 191 (H) 70 - 99 mg/dL  Heparin level (unfractionated)     Status: Abnormal   Collection Time: 08/28/19  8:18 AM  Result Value Ref Range   Heparin Unfractionated <0.10 (L) 0.30 - 0.70 IU/mL    Comment: (NOTE) If heparin results are below expected values, and patient dosage has  been confirmed, suggest follow up testing of antithrombin III levels. Performed at [facility], [address], [place], [place] [address]   Glucose, capillary     Status: Abnormal   Collection Time: 08/28/19 10:05 AM  Result Value Ref Range   Glucose-Capillary 363 (H) 70 - 99 mg/dL  Glucose, capillary     Status: Abnormal   Collection Time: 08/28/19 11:31 AM  Result Value Ref Range   Glucose-Capillary 306 (H) 70 - 99 mg/dL  Glucose, capillary     Status: Abnormal   Collection Time: 08/28/19  2:45 PM  Result Value Ref Range   Glucose-Capillary  353 (H) 70 - 99 mg/dL  Heparin level (unfractionated)     Status: Abnormal   Collection Time: 08/28/19  3:32 PM  Result Value Ref Range   Heparin Unfractionated 0.12 (L) 0.30 - 0.70 IU/mL    Comment: (NOTE) If heparin results are below expected values, and patient dosage has  been confirmed, suggest follow up testing of antithrombin III levels. Performed at [facility], [address] [place], [place] [address]     Medications:  Current Facility-Administered Medications  Medication Dose Route Frequency Provider Last Rate Last Admin  . 0.9 %  sodium chloride infusion   Intravenous Continuous John Giovanniathore, Vasundhra, MD   Stopped at 08/27/19 1800  . 0.9 %  sodium chloride  infusion   Intravenous Continuous Lennette BihariKelly, Thomas A, MD   Stopped at 08/27/19 2111  . 0.9 %  sodium chloride infusion  250 mL Intravenous PRN Lennette BihariKelly, Thomas A, MD      . acetaminophen (TYLENOL) tablet 650 mg  650 mg Oral Q4H PRN Lennette BihariKelly, Thomas A, MD   650 mg at 08/28/19 [id number]  . ALPRAZolam Prudy Feeler(XANAX) tablet 0.25 mg  0.25 mg Oral TID PRN Laverda Pageoberts, Lindsay B, NP   0.25 mg at 08/28/19 1128  . aspirin chewable tablet 81 mg  81 mg Oral Daily Lennette BihariKelly, Thomas A, MD   81 mg at 08/28/19 [id number]  . atorvastatin (LIPITOR) tablet 80 mg  80 mg Oral q1800 Alekh, Kshitiz, MD      . busPIRone (BUSPAR) tablet 10 mg  10 mg Oral QHS Alekh, Kshitiz, MD      . dextrose 5 %-0.45 % sodium chloride infusion   Intravenous Continuous John Giovanniathore, Vasundhra, MD 75 mL/hr at 08/27/19 2100 Rate Verify at 08/27/19 2100  . dextrose 50 % solution 0-50 mL  0-50 mL Intravenous PRN John Giovanniathore, Vasundhra, MD      . diazepam (VALIUM) tablet 5 mg  5 mg Oral Q6H PRN Lennette BihariKelly, Thomas A, MD   5 mg at 08/28/19 0959  . divalproex (DEPAKOTE) DR tablet 1,000 mg  1,000 mg Oral QHS Alekh, Kshitiz, MD      . fenofibrate tablet 160 mg  160 mg Oral Daily Hanley BenAlekh, Kshitiz, MD   160 mg at 08/28/19 1232  . gabapentin (NEURONTIN) capsule 600 mg  600 mg Oral QHS Alekh, Kshitiz, MD      . heparin ADULT infusion 100 units/mL (25000 units/29350mL sodium chloride 0.45%)  1,700 Units/hr Intravenous Continuous Hanley BenAlekh, Kshitiz, MD 17 mL/hr at 08/28/19 1721 1,700 Units/hr at 08/28/19 1721  . insulin (MYXREDLIN) 100 units/100 mL infusion for hypertriglyceridemia-induced pancreatitis  0.2 Units/kg/hr Intravenous Continuous Alekh, Kshitiz, MD      . metoprolol tartrate (LOPRESSOR) tablet 25 mg  25 mg Oral BID Lennette BihariKelly, Thomas A, MD   25 mg at 08/28/19 0958  . mirtazapine (REMERON) tablet 30 mg  30 mg Oral QHS Alekh, Kshitiz, MD      . morphine 2 MG/ML injection 2 mg  2 mg Intravenous Q2H PRN Alekh, Kshitiz, MD      . nicotine (NICODERM CQ - dosed in mg/24 hours) patch 21 mg  21 mg Transdermal  Daily John Giovanniathore, Vasundhra, MD   21 mg at 08/28/19 1031  . nitroGLYCERIN (NITROSTAT) SL tablet 0.4 mg  0.4 mg Sublingual Q5 min PRN John Giovanniathore, Vasundhra, MD   0.4 mg at 08/27/19 1524  . nitroGLYCERIN 50 mg in dextrose 5 % 250 mL (0.2 mg/mL) infusion  0-200 mcg/min Intravenous Titrated Lennette BihariKelly, Thomas A, MD 3 mL/hr at 08/28/19  1501 10 mcg/min at 08/28/19 1501  . ondansetron (ZOFRAN) injection 4 mg  4 mg Intravenous Q6H PRN Lennette Bihari, MD      . sodium chloride flush (NS) 0.9 % injection 3 mL  3 mL Intravenous Once John Giovanni, MD      . sodium chloride flush (NS) 0.9 % injection 3 mL  3 mL Intravenous Q12H Dunn, Dayna N, PA-C   3 mL at 08/27/19 1523  . sodium chloride flush (NS) 0.9 % injection 3 mL  3 mL Intravenous Q12H Nicki Guadalaj[place] A, MD      . sodium chloride flush (NS) 0.9 % injection 3 mL  3 mL Intravenous PRN Lennette Bihari, MD      . venlafaxine [facility]) tablet 150 mg  150 mg Oral Daily Glade Lloyd, MD   150 mg at 08/28/19 1031    Musculoskeletal: Strength & Muscle Tone: within normal limits Gait & Station: Gait not assessed Patient leans: N/A  Psychiatric Specialty Exam: Please note that video during this assessment was unreliable, audio work well and we were able to obtain full history Physical Exam  Review of Systems intermittent/severe chest pains  Blood pressure 140/81, pulse 64, temperature 97.7 F (36.5 C), temperature source Oral, resp. rate 15, height 5\' 10"  (1.778 m), weight 113 kg, SpO2 95 %.Body mass index is 35.74 kg/m.  General Appearance: NA  Eye Contact:  Good  Speech:  Normal Rate  Volume:  Normal  Mood:  Reports feeling a lot better and currently denies depression  Affect:  Appropriate  Thought Process:  Linear and Descriptions of Associations: Intact  Orientation:  Other:  Fully alert and attentive  Thought Content:  No hallucinations, no delusions  Suicidal Thoughts:  No currently denies suicidal or self-injurious ideations  Homicidal Thoughts:   No  Memory:  Recent and remote grossly intact  Judgement:  Fair/ improving  Insight:  Fair  Psychomotor Activity:  Normal  Concentration:  Concentration: Good and Attention Span: Good  Recall:  Good  Fund of Knowledge:  Good  Language:  Good  Akathisia:  Negative  Handed:  Right  AIMS (if indicated):     Assets:  Communication Skills Desire for Improvement Resilience  ADL's:  Intact  Cognition:  WNL  Sleep:        Treatment Plan Summary: 59 year old male, currently admitted with diagnosis of unstable angina/ CAD, scheduled for CABG on 2/4.  Reports he had been feeling increasingly discouraged and depressed prior to this admission due to debilitating chest pain and difficulty in obtaining outpatient services for this issue due to transportation limitations/Covid epidemic related difficulties/restrictions.  He states he is now feeling a lot better and currently denies feeling depressed.  Attributes improvement to being in hospital setting and receiving appropriate treatment for his chest pain. Has a history of depression and PTSD and has been on the above psychiatric medications for several years without any recent dosage or medication changes .  Reports his medication regimen has been well-tolerated and effective.  Disposition: No evidence of imminent risk to self or others at present.   Patient does not meet criteria for psychiatric inpatient admission. Would  continue home medication regimen-Effexor 150 mg daily ( see cautions below) , Remeron 30 mg nightly, BuSpar 10 mg nightly, Depakote 1000 mg nightly.   Would obtain valproic acid serum level to ensure that it is within therapeutic range. Of note, Effexor (venlafaxine) may have cardiovascular side effects, primarily hypertension/tachycardia and should be used with caution.  As noted, patient states he has been on this medication for years without side effects and his vitals are currently stable. Effexor (and other antidepressants)  can increase risk of bleeding in patients on anticoagulation management,  particularly GI bleed.     This service was provided via telemedicine using a 2-way, interactive audio and video technology.* note video imaging ( but not audio) was unreliable  during this assessment   Names of all persons participating in this telemedicine service and their role in this encounter. Name: Nehemiah Massed , MD Role: Psychiatrist Consultant   Name:  Assunta Found, NP  Role: Psychiatry NP   Name:  Role:   Name:  Role:     Craige Cotta, MD 08/28/2019 5:28 PM

## 2019-08-29 ENCOUNTER — Encounter (HOSPITAL_COMMUNITY): Payer: Self-pay | Admitting: Internal Medicine

## 2019-08-29 LAB — URINALYSIS, ROUTINE W REFLEX MICROSCOPIC
Bilirubin Urine: NEGATIVE
Glucose, UA: >=500 mg/dL — AB
Hgb urine dipstick: NEGATIVE
Ketones, ur: NEGATIVE mg/dL
Leukocytes,Ua: NEGATIVE
Nitrite: NEGATIVE
Protein, ur: NEGATIVE mg/dL
Specific Gravity, Urine: 1.015 (ref 1.005–1.030)
pH: 6 (ref 5.0–8.0)

## 2019-08-29 LAB — BLOOD GAS, ARTERIAL
Acid-base deficit: 0.4 mmol/L (ref 0.0–2.0)
Bicarbonate: 23.4 mmol/L (ref 20.0–28.0)
Drawn by: 535271
FIO2: 21
O2 Saturation: 98.5 %
Patient temperature: 36.6
pCO2 arterial: 35.6 mmHg (ref 32.0–48.0)
pH, Arterial: 7.431 (ref 7.350–7.450)
pO2, Arterial: 109 mmHg — ABNORMAL HIGH (ref 83.0–108.0)

## 2019-08-29 LAB — CBC WITH DIFFERENTIAL/PLATELET
Abs Immature Granulocytes: 0.05 10*3/uL (ref 0.00–0.07)
Basophils Absolute: 0.1 10*3/uL (ref 0.0–0.1)
Basophils Relative: 1 %
Eosinophils Absolute: 0.4 10*3/uL (ref 0.0–0.5)
Eosinophils Relative: 4 %
HCT: 40.1 % (ref 39.0–52.0)
Hemoglobin: 13.5 g/dL (ref 13.0–17.0)
Immature Granulocytes: 1 %
Lymphocytes Relative: 42 %
Lymphs Abs: 4.2 10*3/uL — ABNORMAL HIGH (ref 0.7–4.0)
MCH: 30.9 pg (ref 26.0–34.0)
MCHC: 33.7 g/dL (ref 30.0–36.0)
MCV: 91.8 fL (ref 80.0–100.0)
Monocytes Absolute: 1 10*3/uL (ref 0.1–1.0)
Monocytes Relative: 10 %
Neutro Abs: 4.3 10*3/uL (ref 1.7–7.7)
Neutrophils Relative %: 42 %
Platelets: 186 10*3/uL (ref 150–400)
RBC: 4.37 MIL/uL (ref 4.22–5.81)
RDW: 13.8 % (ref 11.5–15.5)
WBC: 9.9 10*3/uL (ref 4.0–10.5)
nRBC: 0 % (ref 0.0–0.2)

## 2019-08-29 LAB — COMPREHENSIVE METABOLIC PANEL
ALT: 28 U/L (ref 0–44)
AST: 20 U/L (ref 15–41)
Albumin: 3.1 g/dL — ABNORMAL LOW (ref 3.5–5.0)
Alkaline Phosphatase: 47 U/L (ref 38–126)
Anion gap: 12 (ref 5–15)
BUN: 11 mg/dL (ref 6–20)
CO2: 21 mmol/L — ABNORMAL LOW (ref 22–32)
Calcium: 8.8 mg/dL — ABNORMAL LOW (ref 8.9–10.3)
Chloride: 101 mmol/L (ref 98–111)
Creatinine, Ser: 0.6 mg/dL — ABNORMAL LOW (ref 0.61–1.24)
GFR calc Af Amer: >60 mL/min (ref >60)
GFR calc non Af Amer: >60 mL/min (ref >60)
Glucose, Bld: 120 mg/dL — ABNORMAL HIGH (ref 70–99)
Potassium: 3.7 mmol/L (ref 3.5–5.1)
Sodium: 134 mmol/L — ABNORMAL LOW (ref 135–145)
Total Bilirubin: 0.4 mg/dL (ref 0.3–1.2)
Total Protein: 5.8 g/dL — ABNORMAL LOW (ref 6.5–8.1)

## 2019-08-29 LAB — GLUCOSE, CAPILLARY
Glucose-Capillary: 144 mg/dL — ABNORMAL HIGH (ref 70–99)
Glucose-Capillary: 155 mg/dL — ABNORMAL HIGH (ref 70–99)
Glucose-Capillary: 132 mg/dL — ABNORMAL HIGH (ref 70–99)
Glucose-Capillary: 138 mg/dL — ABNORMAL HIGH (ref 70–99)
Glucose-Capillary: 142 mg/dL — ABNORMAL HIGH (ref 70–99)
Glucose-Capillary: 144 mg/dL — ABNORMAL HIGH (ref 70–99)
Glucose-Capillary: 275 mg/dL — ABNORMAL HIGH (ref 70–99)
Glucose-Capillary: 156 mg/dL — ABNORMAL HIGH (ref 70–99)
Glucose-Capillary: 236 mg/dL — ABNORMAL HIGH (ref 70–99)

## 2019-08-29 LAB — MAGNESIUM: Magnesium: 2 mg/dL (ref 1.7–2.4)

## 2019-08-29 LAB — PROTIME-INR
INR: 1.1 (ref 0.8–1.2)
Prothrombin Time: 13.6 seconds (ref 11.4–15.2)

## 2019-08-29 LAB — HEMOGLOBIN A1C
Hgb A1c MFr Bld: 14.8 % — ABNORMAL HIGH (ref 4.8–5.6)
Mean Plasma Glucose: 378.06 mg/dL

## 2019-08-29 LAB — TRIGLYCERIDES
Triglycerides: 1283 mg/dL — ABNORMAL HIGH (ref <150)
Triglycerides: 805 mg/dL — ABNORMAL HIGH (ref <150)

## 2019-08-29 LAB — HEPARIN LEVEL (UNFRACTIONATED)
Heparin Unfractionated: 0.3 IU/mL (ref 0.30–0.70)
Heparin Unfractionated: 0.44 IU/mL (ref 0.30–0.70)

## 2019-08-29 LAB — ABO/RH: ABO/RH(D): A POS

## 2019-08-29 MED ORDER — TRANEXAMIC ACID (OHS) PUMP PRIME SOLUTION
2.0000 mg/kg | INTRAVENOUS | Status: DC
  Filled 2019-08-29: qty 2.26

## 2019-08-29 MED ORDER — INSULIN REGULAR(HUMAN) IN NACL 100-0.9 UT/100ML-% IV SOLN
INTRAVENOUS | Status: DC
  Filled 2019-08-29: qty 100

## 2019-08-29 MED ORDER — TRANEXAMIC ACID 1000 MG/10ML IV SOLN
1.5000 mg/kg/h | INTRAVENOUS | Status: AC
  Administered 2019-08-30 (×2): 1.5 mg/kg/h via INTRAVENOUS
  Filled 2019-08-29: qty 25

## 2019-08-29 MED ORDER — TEMAZEPAM 15 MG PO CAPS: 15.0000 mg | ORAL_CAPSULE | Freq: Once | ORAL | Status: DC | PRN

## 2019-08-29 MED ORDER — NITROGLYCERIN IN D5W 200-5 MCG/ML-% IV SOLN
2.0000 ug/min | INTRAVENOUS | Status: DC
  Filled 2019-08-29: qty 250

## 2019-08-29 MED ORDER — PHENYLEPHRINE HCL-NACL 20-0.9 MG/250ML-% IV SOLN
30.0000 ug/min | INTRAVENOUS | Status: AC
  Administered 2019-08-30: 20 ug/min via INTRAVENOUS
  Filled 2019-08-29: qty 250

## 2019-08-29 MED ORDER — METOPROLOL TARTRATE 12.5 MG HALF TABLET
12.5000 mg | ORAL_TABLET | Freq: Once | ORAL | Status: AC
  Administered 2019-08-30: 12.5 mg via ORAL
  Filled 2019-08-29: qty 1

## 2019-08-29 MED ORDER — BISACODYL 5 MG PO TBEC
5.0000 mg | DELAYED_RELEASE_TABLET | Freq: Once | ORAL | Status: AC
  Administered 2019-08-29: 5 mg via ORAL
  Filled 2019-08-29: qty 1

## 2019-08-29 MED ORDER — INSULIN ASPART 100 UNIT/ML ~~LOC~~ SOLN: 0.0000 [IU] | Freq: Every day | SUBCUTANEOUS | Status: DC

## 2019-08-29 MED ORDER — NOREPINEPHRINE 4 MG/250ML-% IV SOLN
0.0000 ug/min | INTRAVENOUS | Status: DC
  Filled 2019-08-29: qty 250

## 2019-08-29 MED ORDER — POTASSIUM CHLORIDE 2 MEQ/ML IV SOLN
80.0000 meq | INTRAVENOUS | Status: DC
  Filled 2019-08-29: qty 40

## 2019-08-29 MED ORDER — LORAZEPAM 0.5 MG PO TABS: 0.5000 mg | ORAL_TABLET | ORAL | Status: DC | PRN

## 2019-08-29 MED ORDER — EPINEPHRINE PF 1 MG/ML IJ SOLN
0.0000 ug/min | INTRAVENOUS | Status: DC
  Filled 2019-08-29: qty 4

## 2019-08-29 MED ORDER — MILRINONE LACTATE IN DEXTROSE 20-5 MG/100ML-% IV SOLN
0.3000 ug/kg/min | INTRAVENOUS | Status: DC
  Filled 2019-08-29: qty 100

## 2019-08-29 MED ORDER — SODIUM CHLORIDE 0.9 % IV SOLN
INTRAVENOUS | Status: DC
  Filled 2019-08-29: qty 30

## 2019-08-29 MED ORDER — CHLORHEXIDINE GLUCONATE CLOTH 2 % EX PADS
6.0000 | MEDICATED_PAD | Freq: Once | CUTANEOUS | Status: AC
  Administered 2019-08-30: 6 via TOPICAL

## 2019-08-29 MED ORDER — DEXMEDETOMIDINE HCL IN NACL 400 MCG/100ML IV SOLN
0.1000 ug/kg/h | INTRAVENOUS | Status: AC
  Administered 2019-08-30 (×2): .7 ug/kg/h via INTRAVENOUS
  Filled 2019-08-29: qty 100

## 2019-08-29 MED ORDER — INSULIN ASPART 100 UNIT/ML ~~LOC~~ SOLN
0.0000 [IU] | Freq: Three times a day (TID) | SUBCUTANEOUS | Status: DC
  Administered 2019-08-29: 8 [IU] via SUBCUTANEOUS
  Administered 2019-08-29: 17:00:00 3 [IU] via SUBCUTANEOUS

## 2019-08-29 MED ORDER — CHLORHEXIDINE GLUCONATE 0.12 % MT SOLN
15.0000 mL | Freq: Once | OROMUCOSAL | Status: AC
  Administered 2019-08-30: 15 mL via OROMUCOSAL
  Filled 2019-08-29: qty 15

## 2019-08-29 MED ORDER — INSULIN REGULAR(HUMAN) IN NACL 100-0.9 UT/100ML-% IV SOLN: INTRAVENOUS | Status: DC

## 2019-08-29 MED ORDER — VANCOMYCIN HCL 1500 MG/300ML IV SOLN
1500.0000 mg | INTRAVENOUS | Status: AC
  Administered 2019-08-30: 1500 mg via INTRAVENOUS
  Filled 2019-08-29: qty 300

## 2019-08-29 MED ORDER — MAGNESIUM SULFATE 50 % IJ SOLN
40.0000 meq | INTRAMUSCULAR | Status: DC
  Filled 2019-08-29: qty 9.85

## 2019-08-29 MED ORDER — SODIUM CHLORIDE 0.9 % IV SOLN
1.5000 g | INTRAVENOUS | Status: AC
  Administered 2019-08-30: 08:00:00 1.5 g via INTRAVENOUS
  Filled 2019-08-29: qty 1.5

## 2019-08-29 MED ORDER — TRANEXAMIC ACID (OHS) BOLUS VIA INFUSION
15.0000 mg/kg | INTRAVENOUS | Status: AC
  Administered 2019-08-30: 1695 mg via INTRAVENOUS
  Filled 2019-08-29: qty 1695

## 2019-08-29 MED ORDER — INSULIN ASPART PROT & ASPART (70-30 MIX) 100 UNIT/ML ~~LOC~~ SUSP: 25.0000 [IU] | Freq: Two times a day (BID) | SUBCUTANEOUS | Status: DC

## 2019-08-29 MED ORDER — SODIUM CHLORIDE 0.9 % IV SOLN
750.0000 mg | INTRAVENOUS | Status: DC
  Filled 2019-08-29: qty 750

## 2019-08-29 MED ORDER — VANCOMYCIN HCL 1000 MG IV SOLR
INTRAVENOUS | Status: DC
  Filled 2019-08-29: qty 1000

## 2019-08-29 MED ORDER — INSULIN ASPART PROT & ASPART (70-30 MIX) 100 UNIT/ML ~~LOC~~ SUSP
25.0000 [IU] | Freq: Two times a day (BID) | SUBCUTANEOUS | Status: DC
  Administered 2019-08-29: 08:00:00 25 [IU] via SUBCUTANEOUS
  Filled 2019-08-29: qty 10

## 2019-08-29 MED ORDER — INSULIN ASPART 100 UNIT/ML ~~LOC~~ SOLN
4.0000 [IU] | Freq: Once | SUBCUTANEOUS | Status: AC
  Administered 2019-08-29: 22:00:00 4 [IU] via SUBCUTANEOUS

## 2019-08-29 MED ORDER — PLASMA-LYTE 148 IV SOLN
INTRAVENOUS | Status: DC
  Filled 2019-08-29: qty 2.5

## 2019-08-29 MED ORDER — CHLORHEXIDINE GLUCONATE CLOTH 2 % EX PADS
6.0000 | MEDICATED_PAD | Freq: Once | CUTANEOUS | Status: AC
  Administered 2019-08-29: 6 via TOPICAL

## 2019-08-29 NOTE — Progress Notes (Signed)
ANTICOAGULATION CONSULT NOTE - Follow Up Consult  Pharmacy Consult for IV Heparin Indication: chest pain/ACS  Allergies  Allergen Reactions  . Hctz [Hydrochlorothiazide] Other (See Comments)    Unknown rxn, "they just told me not to take it anymore"  . Seroquel [Quetiapine Fumarate] Other (See Comments)    Elevates liver enzymes     Patient Measurements: Height: 5\' 10"  (177.8 cm) Weight: 249 lb 1.9 oz (113 kg) IBW/kg (Calculated) : 73 Heparin Dosing Weight: 97.8 kg  Vital Signs: Temp: 97.7 F (36.5 C) (02/02 2357) Temp Source: Oral (02/02 2357) BP: 103/73 (02/02 1930)  Labs: Recent Labs    08/26/19 2044 08/26/19 2044 08/26/19 2228 08/26/19 2233 08/26/19 2233 08/27/19 0203 08/27/19 0203 08/27/19 10/25/19 08/27/19 0813 08/27/19 1321 08/27/19 1729 08/28/19 0305 08/28/19 0818 08/28/19 1532 08/28/19 2333  HGB 17.2*   < >  --  16.7   < >  --   --   --   --   --  14.3 14.4  --   --   --   HCT 47.2   < >  --  49.0  --   --   --   --   --   --  42.0 42.4  --   --   --   PLT 254  --   --   --   --   --   --   --   --   --   --  193  --   --   --   HEPARINUNFRC  --   --   --   --   --   --   --   --   --   --   --   --  <0.10* 0.12* 0.30  CREATININE 1.01   < >  --   --   --  0.64   < > 0.64   < > 0.44* 0.40* 0.75  --   --   --   TROPONINIHS 22*   < > 23*  --   --  28*  --  30*  --   --   --   --   --   --   --    < > = values in this interval not displayed.    Estimated Creatinine Clearance: 129.8 mL/min (by C-G formula based on SCr of 0.75 mg/dL).   Medical History: Past Medical History:  Diagnosis Date  . Arthritis   . Diabetes mellitus without complication (HCC)   . Hypertension   . Pancreatitis   . PTSD (post-traumatic stress disorder)     Assessment: 59 year old male now s/p cardiac cath found to have severe multi-vessel CAD with plan for CABG on 09/19/19; pharmacy was consulted to dose and monitor heparin therapy.  2/3 AM update:  Heparin level low end  therapeutic after rate increase  Goal of Therapy:  Heparin level 0.3-0.7 units/ml Monitor platelets by anticoagulation protocol: Yes   Plan:  -Increase heparin infusion slightly to 1800 units/hr to prevent sub-therapeutic level -Confirmatory heparin level with AM labs Monitor daily heparin level and CBC while on therapy.  Monitor for signs and symptoms of bleeding.   09/21/19, PharmD, BCPS Clinical Pharmacist Phone: [phone]

## 2019-08-29 NOTE — Progress Notes (Signed)
Received call back from provider to clarify insulin gtt transition. The plan is to continue gtt at this time.

## 2019-08-29 NOTE — Progress Notes (Signed)
Patient complains of chest pain radiating to jaw, increased NTG to 20 mcg. Dr Thedore Mins notified. patinet had a similar episode after ambulating with PT at noon.   To give Morphine a bit early and patient to be on bedrest until surgery.  Continue to monitor

## 2019-08-29 NOTE — Progress Notes (Signed)
Patient ID: Jacob Brady, male   DOB: 10-17-1960, 59 y.o.   MRN: [id number]  PROGRESS NOTE    Jacob Brady  YQM:[id number] DOB: 10-17-1960 DOA: 08/26/2019 [facility]   Brief Narrative:  59 year old male with history of poorly controlled insulin-dependent diabetes mellitus, hypertension, hypertriglyceridemia, pancreatitis, major depressive disorder, PTSD presented with chest pain and suicidal ideation.  Cardiology was consulted.  He was found to be in DKA with blood glucose of 639, bicarb of 16 and anion gap of 20 and was started on insulin drip.  He also had complaints of chest pain for which she was seen by cardiology underwent left heart cath showing multivessel disease, now scheduled for CABG on 08/30/2019.  Subjective:  Patient in bed, appears comfortable, denies any headache, no fever, no chest pain or pressure, no shortness of breath , no abdominal pain. No focal weakness.  He is not suicidal or homicidal.  Assessment & Plan:   Chest pain - Presented with chest pain.  High-sensitivity troponins did not elevate significantly.  Stress test was intermediate risk.  Subsequently underwent cardiac cath on 08/27/2019 showed severe multivessel CAD and EF of 45 to 50%, was seen by cardiothoracic surgery and is scheduled for CABG on 08/30/2019.  Continue medical treatment with heparin drip, aspirin and statin along with beta-blocker.  Hypertriglyceridemia, Hypercholesterolemia -Presented with cholesterol of 536 and triglycerides of 5619.  Triglycerides is 1756 today.  We will continue insulin drip at least for 1 more day.  Repeat triglyceride in a.m. -We will start Lipitor 80 mg and fenofibrate as per cardiology recommendations.  Might need Vascepa upon discharge.   Suicidal ideation - History of major depressive disorder/PTSD, Seen and cleared for home discharge by psychiatry this admission  Hypertension - Blood pressure stable.  Continue metoprolol and nitroglycerin  drip  Tobacco use -Patient has been counseled regarding tobacco cessation  DKA in the setting of known uncontrolled insulin-dependent diabetes mellitus type 2 -  On compliant with insulin was counseled, A1c was greater than 15 suggesting poor outpatient control due to hyperglycemia.  DKA has resolved, will be on Endo tool night before his CABG, thereafter 75/25 twice daily and sliding scale.  Lab Results  Component Value Date   HGBA1C 14.8 (H) 08/29/2019   CBG (last 3)  Recent Labs    08/29/19 0414 08/29/19 0530 08/29/19 0638  GLUCAP 138* 142* 144*      DVT prophylaxis: Heparin drip  Code status: Full Family Communication: Spoke to patient at bedside Disposition Plan: Awaiting CABG on 08/30/2019.  Consultants: Cardiology/psychiatry consultation/cardiothoracic surgery  Procedures: Stress test/cardiac catheterization, echocardiogram, CT chest  Antimicrobials: None   Objective: Vitals:   08/29/19 0742 08/29/19 0800 08/29/19 0900 08/29/19 0940  BP: (!) 74/58 99/72 139/85   Pulse: (!) 53 62 64 70  Resp: 18 (!) 0 (!) 0 (!) 6  Temp: (!) 97.3 F (36.3 C)     TempSrc: Oral     SpO2: 90% 91% 98% 100%  Weight:      Height:        Intake/Output Summary (Last 24 hours) at 08/29/2019 1011 Last data filed at 08/29/2019 [id number] Gross per 24 hour  Intake 1153 ml  Output 1600 ml  Net -447 ml   Filed Weights   08/26/19 2044  Weight: 113 kg    Examination:  Awake Alert, No new F.N deficits, Normal affect [place].AT,PERRAL Supple Neck,No JVD, No cervical lymphadenopathy appriciated.  Symmetrical Chest wall movement, Good air movement bilaterally, CTAB RRR,No  Gallops, Rubs or new Murmurs, No Parasternal Heave +ve B.Sounds, Abd Soft, No tenderness, No organomegaly appriciated, No rebound - guarding or rigidity. No Cyanosis, Clubbing or edema, No new Rash or bruise     Data Reviewed: I have personally reviewed following labs and imaging studies  CBC: Recent Labs  Lab  08/26/19 2044 08/26/19 2233 08/27/19 1729 08/28/19 0305 08/29/19 0001  WBC 9.8  --   --  9.9 9.9  NEUTROABS  --   --   --  4.2 4.3  HGB 17.2* 16.7 14.3 14.4 13.5  HCT 47.2 49.0 42.0 42.4 40.1  MCV 90.4  --   --  91.4 91.8  PLT 254  --   --  193 186   Basic Metabolic Panel: Recent Labs  Lab 08/27/19 0349 08/27/19 0349 08/27/19 0813 08/27/19 1321 08/27/19 1729 08/28/19 0305 08/29/19 0001  NA 135   < > 138 131* 132* 134* 134*  K 4.8   < > 4.3 3.6 3.4* 3.6 3.7  CL 100   < > 104 97* 101 102 101  CO2 21*  --  21* 20*  --  21* 21*  GLUCOSE 164*   < > 133* 235* 160* 127* 120*  BUN 16   < > 16 13 12 12 11   CREATININE 0.64   < > 0.67 0.44* 0.40* 0.75 0.60*  CALCIUM 9.7  --  9.5 8.8*  --  8.7* 8.8*  MG  --   --  2.1  --   --  2.0 2.0   < > = values in this interval not displayed.   GFR: Estimated Creatinine Clearance: 129.8 mL/min (A) (by C-G formula based on SCr of 0.6 mg/dL (L)). Liver Function Tests: Recent Labs  Lab 08/26/19 2044 08/27/19 0349 08/28/19 0305 08/29/19 0001  AST 25  --  21 20  ALT 23  --  23 28  ALKPHOS 70  --  49 47  BILITOT 1.7* 1.5* 0.5 0.4  PROT 7.5  --  6.0* 5.8*  ALBUMIN 3.9  --  3.0* 3.1*   No results for input(s): LIPASE, AMYLASE in the last 168 hours. No results for input(s): AMMONIA in the last 168 hours. Coagulation Profile: No results for input(s): INR, PROTIME in the last 168 hours. Cardiac Enzymes: No results for input(s): CKTOTAL, CKMB, CKMBINDEX, TROPONINI in the last 168 hours. BNP (last 3 results) No results for input(s): PROBNP in the last 8760 hours. HbA1C: Recent Labs    08/29/19 0001  HGBA1C 14.8*   CBG: Recent Labs  Lab 08/29/19 0211 08/29/19 0305 08/29/19 0414 08/29/19 0530 08/29/19 0638  GLUCAP 155* 132* 138* 142* 144*   Lipid Profile: Recent Labs    08/27/19 0203 08/28/19 0305 08/29/19 0001 08/29/19 0255  CHOL 536*  --   --   --   HDL NOT REPORTED DUE TO HIGH TRIGLYCERIDES  --   --   --   LDLCALC  UNABLE TO CALCULATE IF TRIGLYCERIDE OVER 400 mg/dL  --   --   --   TRIG 6,213*   < > 1,283* 805*  CHOLHDL NOT REPORTED DUE TO HIGH TRIGLYCERIDES  --   --   --   LDLDIRECT NOT CALCULATED  --   --   --    < > = values in this interval not displayed.   Thyroid Function Tests: No results for input(s): TSH, T4TOTAL, FREET4, T3FREE, THYROIDAB in the last 72 hours. Anemia Panel: No results for input(s): VITAMINB12, FOLATE, FERRITIN, TIBC, IRON,  RETICCTPCT in the last 72 hours. Sepsis Labs: No results for input(s): PROCALCITON, LATICACIDVEN in the last 168 hours.  Recent Results (from the past 240 hour(s))  SARS CORONAVIRUS 2 (TAT 6-24 HRS) Nasopharyngeal Nasopharyngeal Swab     Status: None   Collection Time: 08/26/19 11:19 PM   Specimen: Nasopharyngeal Swab  Result Value Ref Range Status   SARS Coronavirus 2 NEGATIVE NEGATIVE Final    Comment: (NOTE) SARS-CoV-2 target nucleic acids are NOT DETECTED. The SARS-CoV-2 RNA is generally detectable in upper and lower respiratory specimens during the acute phase of infection. Negative results do not preclude SARS-CoV-2 infection, do not rule out co-infections with other pathogens, and should not be used as the sole basis for treatment or other patient management decisions. Negative results must be combined with clinical observations, patient history, and epidemiological information. The expected result is Negative. Fact Sheet for Patients: HairSlick.no Fact Sheet for Healthcare Providers: quierodirigir.com This test is not yet approved or cleared by the [place] FDA and  has been authorized for detection and/or diagnosis of SARS-CoV-2 by FDA under an Emergency Use Authorization (EUA). This EUA will remain  in effect (meaning this test can be used) for the duration of the COVID-19 declaration under Section 56 4(b)(1) of the Act, 21 U.S.C. section 360bbb-3(b)(1), unless the authorization  is terminated or revoked sooner. Performed at [facility], [address], [place], [place] [address]   MRSA PCR Screening     Status: None   Collection Time: 08/27/19  6:35 PM   Specimen: Nasal Mucosa; Nasopharyngeal  Result Value Ref Range Status   MRSA by PCR NEGATIVE NEGATIVE Final    Comment:        The GeneXpert MRSA Assay (FDA approved for NASAL specimens only), is one component of a comprehensive MRSA colonization surveillance program. It is not intended to diagnose MRSA infection nor to guide or monitor treatment for MRSA infections. Performed at [facility], [address], [place], [place] [address]          Radiology Studies: CT CHEST WO CONTRAST  Result Date: 08/28/2019 CLINICAL DATA:  Preoperative evaluation for bypass surgery, aortic disease EXAM: CT CHEST WITHOUT CONTRAST TECHNIQUE: Multidetector CT imaging of the chest was performed following the standard protocol without IV contrast. COMPARISON:  08/26/2019 FINDINGS: Cardiovascular: There is diffuse atherosclerosis throughout the coronary vasculature. Minimal calcification of the aortic valve. Mild calcified atheromatous plaque involving the distal aortic arch and descending thoracic aorta. No pericardial effusion. Mediastinum/Nodes: No enlarged mediastinal or axillary lymph nodes. Thyroid gland, trachea, and esophagus demonstrate no significant findings. Lungs/Pleura: Mild upper lobe predominant emphysema. No superimposed airspace disease, effusion, or pneumothorax. Central airways are widely patent. Upper Abdomen: No acute abnormality. Musculoskeletal: No chest wall mass or suspicious bone lesions identified. Reconstructed images demonstrate no additional findings. IMPRESSION: 1. Mild calcified atheromatous plaque of the distal aortic arch and descending thoracic aorta. 2. Minimal calcification of the aortic valve. 3. Diffuse coronary artery atherosclerosis. 4. Mild emphysema.  No acute airspace  disease. Aortic Atherosclerosis (ICD10-I70.0) and Emphysema (ICD10-J43.9). Electronically Signed   By: Sharlet Salina M.D.   On: 08/28/2019 12:33   CARDIAC CATHETERIZATION  Result Date: 08/27/2019  Prox RCA lesion is 95% stenosed.  Prox Cx lesion is 80% stenosed.  Prox Cx to Mid Cx lesion is 85% stenosed.  Mid Cx lesion is 95% stenosed.  Dist Cx-1 lesion is 90% stenosed.  Dist Cx-2 lesion is 50% stenosed.  Ost LAD to Prox LAD  lesion is 75% stenosed.  1st Diag lesion is 90% stenosed.  Mid LAD lesion is 40% stenosed.  There is mild left ventricular systolic dysfunction.  The left ventricular ejection fraction is 45-50% by visual estimate.  Severe multivessel CAD with 75% eccentric near ostial LAD stenosis, diffuse 90% stenosis in the first diagonal branch of the LAD with 40% mid LAD stenosis; segmental high-grade multiple stenoses in a large left circumflex vessel with 80 and 85% proximal stenosis, 95% stenosis at the bifurcation of a large marginal branch followed by 90% stenosis and distal 50% stenosis; large dominant RCA with 95% ulcerated plaque in the proximal to mid vessel. Mild LV dysfunction with EF estimated 45 to 50% and suggestion of mild distal inferior hypocontractility.  LVEDP 18 mm. RECOMMENDATION: Angiographic findings were reviewed with Dr. Herbie BaltimoreHarding who had seen the patient in cardiology consult.  The patient will be started on IV nitroglycerin drip.  He will to new amlodipine and beta-blocker therapy will be initiated.  Will change statin therapy to atorvastatin 80 mg.  We will add fenofibrate with his marked hypertriglyceridemia and also consider Vascepa post discharge.  He will need stabilization of his diabetic status.  Surgical consultation will be obtained in am.   NM Myocar Multi W/Spect W/Wall Motion / EF  Result Date: 08/27/2019  There was no ST segment deviation noted during stress.  Findings consistent with ischemia and prior myocardial infarction.  This is an  intermediate risk study.  The left ventricular ejection fraction is mildly decreased (45-54%).  Large area of inferior lateal wall infarct at mid and basal level with small area of moderate apical ischemia EF 47% with inferior lateral wall hypokinesis   ECHOCARDIOGRAM COMPLETE  Result Date: 08/28/2019   ECHOCARDIOGRAM REPORT   Patient Name:   Jacob DrapeSCOTT CHARLES Braxton Date of Exam: 08/28/2019 Medical Rec #:  [id number]           Height:       70.0 in Accession #:    [id number]          Weight:       249.1 lb Date of Birth:  10/07/1960           BSA:          2.29 m Patient Age:    58 years            BP:           140/81 mmHg Patient Gender: M                   HR:           77 bpm. Exam Location:  Inpatient Procedure: 2D Echo, Cardiac Doppler and Color Doppler Indications:    I25.110 Atherosclerotic heart disease of native coronary artery                 with unstable angina pectoris  History:        Patient has no prior history of Echocardiogram examinations.                 Abnormal ECG, Signs/Symptoms:Chest Pain; Risk                 Factors:Hypertension and Diabetes.  Sonographer:    Sheralyn Boatmanina West RDCS Referring Phys: [phone] THOMAS A KELLY  Sonographer Comments: Image acquisition challenging due to respiratory motion. IMPRESSIONS  1. Left ventricular ejection fraction, by visual estimation, is 60 to 65%. The left ventricle has normal function. Moderately increased  left ventricular posterior wall thickness. There is severely increased left ventricular hypertrophy of the basal septum.  2. The left ventricle has no regional wall motion abnormalities.  3. Left ventricular diastolic parameters are consistent with Grade I diastolic dysfunction (impaired relaxation).  4. Global right ventricle has normal systolic function.The right ventricular size is normal. No increase in right ventricular wall thickness.  5. Left atrial size was normal.  6. Right atrial size was normal.  7. The mitral valve is normal in structure. No evidence  of mitral valve regurgitation. No evidence of mitral stenosis.  8. The tricuspid valve is normal in structure. Tricuspid valve regurgitation is not demonstrated.  9. The aortic valve is tricuspid. There is moderate thickening and moderate calcification of the aortic valve. Aortic valve regurgitation is not visualized. No evidence of aortic valve sclerosis or stenosis. 10. The pulmonic valve was normal in structure. Pulmonic valve regurgitation is not visualized. 11. The inferior vena cava is normal in size with greater than 50% respiratory variability, suggesting right atrial pressure of 3 mmHg. FINDINGS  Left Ventricle: Left ventricular ejection fraction, by visual estimation, is 60 to 65%. The left ventricle has normal function. The left ventricle has no regional wall motion abnormalities. Moderately increased left ventricular posterior wall thickness.  There is severely increased left ventricular hypertrophy of the basal septum. The left ventricular diastology could not be evaluated due to atrial fibrillation. Left ventricular diastolic parameters are consistent with Grade I diastolic dysfunction (impaired relaxation). Right Ventricle: The right ventricular size is normal. No increase in right ventricular wall thickness. Global RV systolic function is has normal systolic function. Left Atrium: Left atrial size was normal in size. Right Atrium: Right atrial size was normal in size Pericardium: There is no evidence of pericardial effusion. Mitral Valve: The mitral valve is normal in structure. No evidence of mitral valve regurgitation. No evidence of mitral valve stenosis by observation. Tricuspid Valve: The tricuspid valve is normal in structure. Tricuspid valve regurgitation is not demonstrated. Aortic Valve: The aortic valve is tricuspid. There is moderate thickening and moderate calcification of the aortic valve. Aortic valve regurgitation is not visualized.  Pulmonic Valve: The pulmonic valve was normal in  structure. Pulmonic valve regurgitation is not visualized. Pulmonic regurgitation is not visualized. Aorta: The aortic root, ascending aorta and aortic arch are all structurally normal, with no evidence of dilitation or obstruction. Venous: The inferior vena cava is normal in size with greater than 50% respiratory variability, suggesting right atrial pressure of 3 mmHg. IAS/Shunts: No atrial level shunt detected by color flow Doppler. There is no evidence of a patent foramen ovale. No ventricular septal defect is seen or detected. There is no evidence of an atrial septal defect.  LEFT VENTRICLE PLAX 2D LVIDd:         5.10 cm       Diastology LVIDs:         3.60 cm       LV e' lateral:   5.00 cm/s LV PW:         1.50 cm       LV E/e' lateral: 13.0 LV IVS:        1.70 cm       LV e' medial:    5.33 cm/s LVOT diam:     2.30 cm       LV E/e' medial:  12.2 LV SV:         69 ml LV SV Index:   28.85 LVOT Area:  4.15 cm  LV Volumes (MOD) LV area d, A2C:    29.90 cm LV area d, A4C:    30.60 cm LV area s, A2C:    17.20 cm LV area s, A4C:    19.20 cm LV major d, A2C:   8.17 cm LV major d, A4C:   8.00 cm LV major s, A2C:   6.85 cm LV major s, A4C:   6.59 cm LV vol d, MOD A2C: 95.3 ml LV vol d, MOD A4C: 95.4 ml LV vol s, MOD A2C: 37.1 ml LV vol s, MOD A4C: 46.6 ml LV SV MOD A2C:     58.2 ml LV SV MOD A4C:     95.4 ml LV SV MOD BP:      53.4 ml RIGHT VENTRICLE RV S prime:     9.79 cm/s TAPSE (M-mode): 2.3 cm LEFT ATRIUM           Index       RIGHT ATRIUM           Index LA diam:      4.00 cm 1.75 cm/m  RA Area:     16.10 cm LA Vol (A2C): 31.1 ml 13.57 ml/m RA Volume:   42.80 ml  18.68 ml/m LA Vol (A4C): 26.4 ml 11.52 ml/m  AORTIC VALVE LVOT Vmax:   136.00 cm/s LVOT Vmean:  84.000 cm/s LVOT VTI:    0.262 m  AORTA Ao Root diam: 3.20 cm Ao Asc diam:  3.10 cm MITRAL VALVE MV Area (PHT): 3.21 cm             SHUNTS MV PHT:        68.44 msec           Systemic VTI:  0.26 m MV Decel Time: 236 msec             Systemic Diam:  2.30 cm MV E velocity: 65.10 cm/s 103 cm/s MV A velocity: 93.80 cm/s 70.3 cm/s MV E/A ratio:  0.69       1.5  Fransico Him MD Electronically signed by Fransico Him MD Signature Date/Time: 08/28/2019/2:26:43 PM    Final    VAS US DOPPLER PRE CABG  Result Date: 08/29/2019 PREOPERATIVE VASCULAR EVALUATION  Indications: Pre-CABG. Performing Technologist: Baldwin Crown RVT, RDMS  Examination Guidelines: A complete evaluation includes B-mode imaging, spectral Doppler, color Doppler, and power Doppler as needed of all accessible portions of each vessel. Bilateral testing is considered an integral part of a complete examination. Limited examinations for reoccurring indications may be performed as noted.  Right Carotid Findings: +----------+--------+--------+--------+------------+------------------+           PSV cm/sEDV cm/sStenosisDescribe    Comments           +----------+--------+--------+--------+------------+------------------+ CCA Prox  72      15                                             +----------+--------+--------+--------+------------+------------------+ CCA Distal56      19                          intimal thickening +----------+--------+--------+--------+------------+------------------+ ICA Prox  69      24      1-39%   heterogenous                   +----------+--------+--------+--------+------------+------------------+  ICA Distal58      22                                             +----------+--------+--------+--------+------------+------------------+ ECA       97      16                                             +----------+--------+--------+--------+------------+------------------+ +----------+--------+-------+----------------+------------+           PSV cm/sEDV cmsDescribe        Arm Pressure +----------+--------+-------+----------------+------------+ Subclavian113            Multiphasic, [id number]           +----------+--------+-------+----------------+------------+ +---------+--------+--+--------+--+---------+ VertebralPSV cm/s51EDV cm/s20Antegrade +---------+--------+--+--------+--+---------+ Left Carotid Findings: +----------+--------+--------+--------+------------+------------------+           PSV cm/sEDV cm/sStenosisDescribe    Comments           +----------+--------+--------+--------+------------+------------------+ CCA Prox  83      22                                             +----------+--------+--------+--------+------------+------------------+ CCA Distal65      20                          intimal thickening +----------+--------+--------+--------+------------+------------------+ ICA Prox  94      31      1-39%   heterogenous                   +----------+--------+--------+--------+------------+------------------+ ICA Distal41      19                                             +----------+--------+--------+--------+------------+------------------+ ECA       112     20                                             +----------+--------+--------+--------+------------+------------------+ +----------+--------+--------+----------------+------------+ SubclavianPSV cm/sEDV cm/sDescribe        Arm Pressure +----------+--------+--------+----------------+------------+           142             Multiphasic, [id number]          +----------+--------+--------+----------------+------------+ +---------+--------+--+--------+--+---------+ VertebralPSV cm/s34EDV cm/s15Antegrade +---------+--------+--+--------+--+---------+  ABI Findings: +---------+------------------+-----+---------+--------+ Right    Rt Pressure (mmHg)IndexWaveform Comment  +---------+------------------+-----+---------+--------+ Brachial 150                    triphasic         +---------+------------------+-----+---------+--------+ PTA      156               1.04 triphasic          +---------+------------------+-----+---------+--------+ DP       164               1.09 triphasic         +---------+------------------+-----+---------+--------+  Great Toe160               1.07 Normal            +---------+------------------+-----+---------+--------+ +---------+------------------+-----+---------+-------+ Left     Lt Pressure (mmHg)IndexWaveform Comment +---------+------------------+-----+---------+-------+ Brachial 147                    triphasic        +---------+------------------+-----+---------+-------+ PTA      160               1.07 triphasic        +---------+------------------+-----+---------+-------+ DP       148               0.99 triphasic        +---------+------------------+-----+---------+-------+ Great Toe159               1.06 Normal           +---------+------------------+-----+---------+-------+ +-------+---------------+----------------+ ABI/TBIToday's ABI/TBIPrevious ABI/TBI +-------+---------------+----------------+ Right  1.09                            +-------+---------------+----------------+ Left   1.07                            +-------+---------------+----------------+  Right Doppler Findings: +--------+--------+-----+---------+--------+ Site    PressureIndexDoppler  Comments +--------+--------+-----+---------+--------+ Brachial150          triphasic         +--------+--------+-----+---------+--------+ Radial               triphasic         +--------+--------+-----+---------+--------+ Ulnar                triphasic         +--------+--------+-----+---------+--------+  Left Doppler Findings: +--------+--------+-----+---------+--------+ Site    PressureIndexDoppler  Comments +--------+--------+-----+---------+--------+ [id number]          triphasic         +--------+--------+-----+---------+--------+ Radial               triphasic          +--------+--------+-----+---------+--------+ Ulnar                triphasic         +--------+--------+-----+---------+--------+  Summary: Right Carotid: Velocities in the right ICA are consistent with a 1-39% stenosis.                The extracranial vessels were near-normal with only minimal wall                thickening or plaque. Left Carotid: Velocities in the left ICA are consistent with a 1-39% stenosis.               The extracranial vessels were near-normal with only minimal wall               thickening or plaque. Vertebrals:  Bilateral vertebral arteries demonstrate antegrade flow. Subclavians: Normal flow hemodynamics were seen in bilateral subclavian              arteries. Right ABI: Resting right ankle-brachial index is within normal range. No evidence of significant right lower extremity arterial disease. Left ABI: Resting left ankle-brachial index is within normal range. No evidence of significant left lower extremity arterial disease. Right Upper Extremity: Doppler waveforms remain within normal limits with right radial compression. Doppler waveform reverses with right ulnar compression. Left  Upper Extremity: Doppler waveforms remain within normal limits with left radial compression. Doppler waveforms decrease >50% with left ulnar compression.     Preliminary     Scheduled Meds: . aspirin  81 mg Oral Daily  . atorvastatin  80 mg Oral q1800  . busPIRone  10 mg Oral QHS  . divalproex  1,000 mg Oral QHS  . fenofibrate  160 mg Oral Daily  . gabapentin  600 mg Oral QHS  . [START ON 08/30/2019] heparin-papaverine-plasmalyte irrigation   Irrigation To OR  . insulin aspart  0-15 Units Subcutaneous TID WC  . [START ON 08/30/2019] insulin aspart  0-5 Units Subcutaneous QHS  . [START ON 08/30/2019] insulin aspart protamine- aspart  25 Units Subcutaneous BID WC  . insulin   Intravenous To OR  . [START ON 08/30/2019] magnesium sulfate  40 mEq Other To OR  . metoprolol tartrate  25 mg Oral BID  .  mirtazapine  30 mg Oral QHS  . nicotine  21 mg Transdermal Daily  . [START ON 08/30/2019] phenylephrine  30-200 mcg/min Intravenous To OR  . [START ON 08/30/2019] potassium chloride  80 mEq Other To OR  . sodium chloride flush  3 mL Intravenous Q12H  . [START ON 08/30/2019] tranexamic acid  15 mg/kg Intravenous To OR  . [START ON 08/30/2019] tranexamic acid  2 mg/kg Intracatheter To OR  . [START ON 08/30/2019] vancomycin 1000 mg in NS (1000 ml) irrigation for Dr. Cornelius Moras case   Irrigation To OR  . venlafaxine  150 mg Oral Daily   Continuous Infusions: . sodium chloride 250 mL (08/29/19 0909)  . [START ON 08/30/2019] cefUROXime (ZINACEF)  IV    . [START ON 08/30/2019] cefUROXime (ZINACEF)  IV    . [START ON 08/30/2019] dexmedetomidine    . [START ON 08/30/2019] epinephrine    . [START ON 08/30/2019] heparin 30,000 units/NS 1000 mL solution for CELLSAVER    . heparin 1,800 Units/hr (08/29/19 0021)  . [START ON 08/30/2019] milrinone    . nitroGLYCERIN 10 mcg/min (08/29/19 0015)  . [START ON 08/30/2019] nitroGLYCERIN    . [START ON 08/30/2019] norepinephrine    . [START ON 08/30/2019] tranexamic acid (CYKLOKAPRON) infusion ([facility])    . [START ON 08/30/2019] vancomycin            Susa Raring, MD [facility] 08/29/2019, 10:11 AM

## 2019-08-29 NOTE — Plan of Care (Signed)

## 2019-08-29 NOTE — Progress Notes (Signed)
Paged Primary MD x 2 to clarify transition off insulin gtt no call returned back yet.

## 2019-08-29 NOTE — Anesthesia Preprocedure Evaluation (Addendum)
Anesthesia Evaluation  Patient identified by MRN, date of birth, ID band Patient awake    Reviewed: Allergy & Precautions, NPO status , Patient's Chart, lab work & pertinent test results  History of Anesthesia Complications Negative for: history of anesthetic complications  Airway Mallampati: II  TM Distance: >3 FB Neck ROM: Full    Dental  (+) Dental Advisory Given, Loose, Poor Dentition   Pulmonary Current Smoker and Patient abstained from smoking.,    Pulmonary exam normal        Cardiovascular hypertension, Pt. on medications + angina + CAD  Normal cardiovascular exam   '21 Carotid US - 1-39% b/l ICAS  '21 TTE - EF 60 to 65%. Moderately increased left  ventricular posterior wall thickness. There is severely increased left ventricular hypertrophy of the basal  septum. Grade I diastolic dysfunction (impaired relaxation).   '21 Cath -  Prox RCA lesion is 95% stenosed.  Prox Cx lesion is 80% stenosed.  Prox Cx to Mid Cx lesion is 85% stenosed.  Mid Cx lesion is 95% stenosed.  Dist Cx-1 lesion is 90% stenosed.  Dist Cx-2 lesion is 50% stenosed.  Ost LAD to Prox LAD lesion is 75% stenosed.  1st Diag lesion is 90% stenosed.  Mid LAD lesion is 40% stenosed.  There is mild left ventricular systolic dysfunction.  The left ventricular ejection fraction is 45-50% by visual estimate.    Neuro/Psych PSYCHIATRIC DISORDERS Anxiety Depression  Hx suicidal ideation negative neurological ROS     GI/Hepatic negative GI ROS, Neg liver ROS,   Endo/Other  diabetes, Type 2, Insulin Dependent Obesity   Renal/GU negative Renal ROS     Musculoskeletal  (+) Arthritis ,   Abdominal   Peds  Hematology negative hematology ROS (+)   Anesthesia Other Findings Covid neg 1/31  Reproductive/Obstetrics                            Anesthesia Physical Anesthesia Plan  ASA: IV  Anesthesia Plan:  General   Post-op Pain Management:    Induction: Intravenous  PONV Risk Score and Plan: 2 and Treatment may vary due to age or medical condition  Airway Management Planned: Oral ETT  Additional Equipment: Arterial line, CVP, PA Cath, TEE and Ultrasound Guidance Line Placement  Intra-op Plan:   Post-operative Plan: Post-operative intubation/ventilation  Informed Consent: I have reviewed the patients History and Physical, chart, labs and discussed the procedure including the risks, benefits and alternatives for the proposed anesthesia with the patient or authorized representative who has indicated his/her understanding and acceptance.     Dental advisory given  Plan Discussed with: CRNA and Anesthesiologist  Anesthesia Plan Comments:        Anesthesia Quick Evaluation

## 2019-08-29 NOTE — Progress Notes (Signed)
Patient with yellow MEWS, no respirations on monitor.  Leads readjusted to pick up respiratory movement

## 2019-08-29 NOTE — Evaluation (Signed)
Physical Therapy Evaluation Patient Details Name: Jacob Brady MRN: [id number] DOB: Aug 07, 1960 Today's Date: 08/29/2019   History of Present Illness  Pt is a 59 yo male with  poorly controlled insulin-dependent DM, HTN, HLD, major depressive disorder, PTSD, and pancreatitis who was seen for the evaluation of chest pain. Upon admission, pt dx with unstable angina and reduced EF (45-54%) with plan for CABG 2/4.  Clinical Impression  Pt sitting EOB upon arrival of PT, agreeable to PT evaluation at this time. The pt presents with limitations in functional mobility, activity tolerance, and dynamic stability compared to his prior level of function and independence due to above dx. The pt was able to demo transfers and ambulation without the use of an AD, but had multiple moments of instability and LOB requiring assist to correct. The patient completed a 5xsit-to-stand (5XSTS) in 16 sec without the use of UE, but reports of onset of 5/10 chest pain following activity. This is above the age-based cut-off of 11s and therefore indicates they are at increased risk for falls. The patient will therefore continue to benefit from skilled PT to address limitations in functional strength, endurance, and balance.      Follow Up Recommendations Supervision for mobility/OOB(defer until after CABG 2/4)    Equipment Recommendations  (defer until after CABG 2/4)    Recommendations for Other Services       Precautions / Restrictions Precautions Precautions: Fall Restrictions Weight Bearing Restrictions: No      Mobility  Bed Mobility Overal bed mobility: Modified Independent             General bed mobility comments: pt sitting EOB upon arrival, supervision for line management may be best  Transfers Overall transfer level: Needs assistance Equipment used: None Transfers: Sit to/from Stand Sit to Stand: Supervision;Min guard         General transfer comment: Pt standing independently  upon arrival of PT, however, he demos multiple LOB and instability with initial stand, benefits from supervision/min guard for safety with initial stand  Ambulation/Gait Ambulation/Gait assistance: Min guard Gait Distance (Feet): 250 Feet Assistive device: None Gait Pattern/deviations: Step-through pattern;Decreased stride length;Wide base of support Gait velocity: 0.67 m/s Gait velocity interpretation: 1.31 - 2.62 ft/sec, indicative of limited community ambulator General Gait Details: pt with multiple instances of instability and posterior LOB despite reporting he feels safe to walk without assist. Wide BOS, minimal clearance bilaterally, increased lateral movement  Stairs            Wheelchair Mobility    Modified Rankin (Stroke Patients Only)       Balance Overall balance assessment: Needs assistance Sitting-balance support: No upper extremity supported;Feet supported Sitting balance-Leahy Scale: Good Sitting balance - Comments: independent sitting EOB once positioned due to lines     Standing balance-Leahy Scale: Fair Standing balance comment: pt able to static and dynamic stance without UE support, multiple LOB with initial stand and ambulation, but no AD used                             Pertinent Vitals/Pain Pain Assessment: 0-10 Pain Score: 4  Pain Location: chest, neck, jaw, back Pain Descriptors / Indicators: Sore;Discomfort Pain Intervention(s): Limited activity within patient's tolerance;Monitored during session;Patient requesting pain meds-RN notified    Home Living Family/patient expects to be discharged to:: Private residence Living Arrangements: Alone Available Help at Discharge: Other (Comment)(pt reports he has no one who can assist) Type of  Home: Apartment Home Access: Stairs to enter   Entrance Stairs-Number of Steps: 15 Home Layout: One level Home Equipment: None Additional Comments: pt reports having no AD or equipment to icrease  safety in home.    Prior Function Level of Independence: Independent         Comments: pt reports ambulating 1.5 miles to grocery store 2x/wk without AD, able to carry groceries home. Reports he gets OOB with carrying groceries up stairs, but no LOB. Pt reports he does not drive, has been relying on transportation from [place] to attend appointments, but this was canceled with covid.     Hand Dominance   Dominant Hand: Right    Extremity/Trunk Assessment   Upper Extremity Assessment Upper Extremity Assessment: Overall WFL for tasks assessed    Lower Extremity Assessment Lower Extremity Assessment: Overall WFL for tasks assessed    Cervical / Trunk Assessment Cervical / Trunk Assessment: Normal  Communication   Communication: No difficulties  Cognition Arousal/Alertness: Awake/alert Behavior During Therapy: WFL for tasks assessed/performed;Flat affect Overall Cognitive Status: Within Functional Limits for tasks assessed                                 General Comments: Pt with generally flat affect, but pleasant and cooperative through session, even reporting he was very thankful for walking with him.      General Comments      Exercises Other Exercises Other Exercises: 5X STS in 16 sec   Assessment/Plan    PT Assessment Patient needs continued PT services  PT Problem List Decreased mobility;Decreased safety awareness;Decreased range of motion;Decreased coordination;Decreased knowledge of precautions;Obesity;Decreased activity tolerance;Cardiopulmonary status limiting activity;Pain;Decreased balance       PT Treatment Interventions DME instruction;Therapeutic exercise;Gait training;Balance training;Stair training;Functional mobility training;Therapeutic activities;Patient/family education    PT Goals (Current goals can be found in the Care Plan section)  Acute Rehab PT Goals Patient Stated Goal: return home PT Goal Formulation: With patient Time For  Goal Achievement: 09/12/19 Potential to Achieve Goals: Good    Frequency Min 3X/week   Barriers to discharge Decreased caregiver support pt lives alone with no one to assist at d/c. Pt has no form of transportation    Co-evaluation               AM-PAC PT "6 Clicks" Mobility  Outcome Measure Help needed turning from your back to your side while in a flat bed without using bedrails?: None Help needed moving from lying on your back to sitting on the side of a flat bed without using bedrails?: None Help needed moving to and from a bed to a chair (including a wheelchair)?: A Little Help needed standing up from a chair using your arms (e.g., wheelchair or bedside chair)?: A Little Help needed to walk in hospital room?: A Little Help needed climbing 3-5 steps with a railing? : A Lot 6 Click Score: 19    End of Session Equipment Utilized During Treatment: Gait belt Activity Tolerance: Patient tolerated treatment well;Patient limited by fatigue Patient left: in chair;with nursing/sitter in room;with call bell/phone within reach Nurse Communication: Mobility status PT Visit Diagnosis: Difficulty in walking, not elsewhere classified (R26.2);History of falling (Z91.81);Pain Pain - Right/Left: (central) Pain - part of body: (chest)    Time: 9562-1308 PT Time Calculation (min) (ACUTE ONLY): 26 min   Charges:   PT Evaluation $PT Eval Low Complexity: 1 Low PT Treatments $Gait Training: 8-22  mins        Deland Pretty, DPT   Acute Rehabilitation Department Pager #: [phone]  Gaetana Michaelis 08/29/2019, 12:12 PM

## 2019-08-29 NOTE — Progress Notes (Signed)
ANTICOAGULATION CONSULT NOTE - Follow Up Consult  Pharmacy Consult for IV Heparin Indication: chest pain/ACS  Allergies  Allergen Reactions  . Hctz [Hydrochlorothiazide] Other (See Comments)    Unknown rxn, "they just told me not to take it anymore"  . Seroquel [Quetiapine Fumarate] Other (See Comments)    Elevates liver enzymes     Patient Measurements: Height: 5\' 10"  (177.8 cm) Weight: 249 lb 1.9 oz (113 kg) IBW/kg (Calculated) : 73 Heparin Dosing Weight: 97.8 kg  Vital Signs: Temp: 97.3 F (36.3 C) (02/03 0742) Temp Source: Oral (02/03 0742) BP: 74/58 (02/03 0742) Pulse Rate: 53 (02/03 0742)  Labs: Recent Labs    08/26/19 2044 08/26/19 2044 08/26/19 2228 08/26/19 2233 08/27/19 0203 08/27/19 0203 08/27/19 10/25/19 08/27/19 0813 08/27/19 1729 08/27/19 1729 08/28/19 0305 08/28/19 0818 08/28/19 1532 08/28/19 2333 08/29/19 0001 08/29/19 0255  HGB 17.2*  --   --    < >  --    < >  --   --  14.3   < > 14.4  --   --   --  13.5  --   HCT 47.2  --   --    < >  --    < >  --   --  42.0  --  42.4  --   --   --  40.1  --   PLT 254  --   --   --   --   --   --   --   --   --  193  --   --   --  186  --   HEPARINUNFRC  --   --   --   --   --   --   --   --   --   --   --    < > 0.12* 0.30  --  0.44  CREATININE 1.01  --   --    < > 0.64   < > 0.64   < > 0.40*  --  0.75  --   --   --  0.60*  --   TROPONINIHS 22*   < > 23*  --  28*  --  30*  --   --   --   --   --   --   --   --   --    < > = values in this interval not displayed.    Estimated Creatinine Clearance: 129.8 mL/min (A) (by C-G formula based on SCr of 0.6 mg/dL (L)).   Medical History: Past Medical History:  Diagnosis Date  . Arthritis   . Diabetes mellitus without complication (HCC)   . Hypertension   . Pancreatitis   . PTSD (post-traumatic stress disorder)     Assessment: 59 year old male now s/p cardiac cath found to have severe multi-vessel CAD with plan for CABG on 09/19/19; pharmacy was consulted to  dose and monitor heparin therapy.  Heparin level in mid range of goal at 0.4. No bleeding issues noted. CBC stable.   Goal of Therapy:  Heparin level 0.3-0.7 units/ml Monitor platelets by anticoagulation protocol: Yes   Plan:  Continue heparin infusion at 1800 units/hr  Planning CABG in am   09/21/19 PharmD., BCPS Clinical Pharmacist 08/29/2019 8:58 AM

## 2019-08-29 NOTE — Progress Notes (Signed)
[id number] Did not walk with pt as he stated he had CP walking earlier. Pt stated he has stairs at home as he lives on second floor so he will need PT to continue to see after surgery. Pt has [facility] booklet. Offered to put on pre op video but he declined. Discussed the importance of staying in the tube and adhering to sternal precautions after surgery. Discussed importance of mobility. Pt demonstrated 2500 ml on IS correctly twice. Pt is concerned as he lives by himself and does not drive. He questioned if he needs hospital bed, walker etc. Discussed with pt that case manager will see after surgery to discuss discharge needs. Emotional support given. Will follow up after surgery. Luetta Nutting RN BSN 08/29/2019 1:47 PM

## 2019-08-29 NOTE — Progress Notes (Signed)
Chaplain engaged in initial visit with Jacob Brady, responding to consult for an Advanced Directive.  Chaplain discussed what an Advanced Directive is and how to assign someone as a healthcare POA.  Jacob Brady said he would let me know if he wanted to complete an AD after speaking with his sister.  Chaplain will follow-up as needed.

## 2019-08-29 NOTE — Progress Notes (Signed)
Progress Note  Patient Name: Jacob Brady Date of Encounter: 08/29/2019  Primary Cardiologist: No primary care provider on file.   Subjective   Seen by CT surgery-Dr. Vickey Sages, plan for CABG 08/30/2019 with follow-up pending. Seen by psychiatry-ruled out for suicidal ideation and at risk for himself.  Did not meet inpatient criteria.  Recommend to continue home medicine.  Overnight, blood pressure dropped while on nitroglycerin drip.  Some of the low blood pressure readings are because the cuff was not adequately tightened on his arm.  He said he had some chest pain last night which left him to Increase nitro drip.   Not having chest pain now, just has a headache.  Indicates that he has not been sleeping well since having to get some sleep today prior to surgery.  Talked with his family which is very comforting for him.  They are concerned justifiably, but glad that his diagnosis has been made.  Inpatient Medications    Scheduled Meds: . aspirin  81 mg Oral Daily  . atorvastatin  80 mg Oral q1800  . busPIRone  10 mg Oral QHS  . divalproex  1,000 mg Oral QHS  . fenofibrate  160 mg Oral Daily  . gabapentin  600 mg Oral QHS  . [START ON 08/30/2019] heparin-papaverine-plasmalyte irrigation   Irrigation To OR  . insulin aspart  0-15 Units Subcutaneous TID WC  . insulin aspart  0-5 Units Subcutaneous QHS  . insulin aspart protamine- aspart  25 Units Subcutaneous BID WC  . [START ON 08/30/2019] insulin   Intravenous To OR  . [START ON 08/30/2019] magnesium sulfate  40 mEq Other To OR  . metoprolol tartrate  25 mg Oral BID  . mirtazapine  30 mg Oral QHS  . nicotine  21 mg Transdermal Daily  . [START ON 08/30/2019] phenylephrine  30-200 mcg/min Intravenous To OR  . [START ON 08/30/2019] potassium chloride  80 mEq Other To OR  . sodium chloride flush  3 mL Intravenous Once  . sodium chloride flush  3 mL Intravenous Q12H  . [START ON 08/30/2019] tranexamic acid  15 mg/kg Intravenous To OR  .  [START ON 08/30/2019] tranexamic acid  2 mg/kg Intracatheter To OR  . venlafaxine  150 mg Oral Daily   Continuous Infusions: . sodium chloride    . [START ON 08/30/2019] cefUROXime (ZINACEF)  IV    . [START ON 08/30/2019] cefUROXime (ZINACEF)  IV    . [START ON 08/30/2019] dexmedetomidine    . [START ON 08/30/2019] epinephrine    . [START ON 08/30/2019] heparin 30,000 units/NS 1000 mL solution for CELLSAVER    . heparin 1,800 Units/hr (08/29/19 0021)  . [START ON 08/30/2019] milrinone    . nitroGLYCERIN 10 mcg/min (08/29/19 0015)  . [START ON 08/30/2019] nitroGLYCERIN    . [START ON 08/30/2019] norepinephrine    . [START ON 08/30/2019] tranexamic acid (CYKLOKAPRON) infusion ([facility])    . [START ON 08/30/2019] vancomycin     PRN Meds: sodium chloride, acetaminophen, ALPRAZolam, dextrose, diazepam, morphine injection, nitroGLYCERIN, ondansetron (ZOFRAN) IV   Vital Signs    Vitals:   08/29/19 0530 08/29/19 0533 08/29/19 0534 08/29/19 0742  BP: 114/72   (!) 74/58  Pulse: 62   (!) 53  Resp: 12 12 12    Temp:      TempSrc:      SpO2: 91%   90%  Weight:      Height:        Intake/Output Summary (Last 24  hours) at 08/29/2019 0805 Last data filed at 08/29/2019 0338 Gross per 24 hour  Intake 1153 ml  Output 2000 ml  Net -847 ml   Last 3 Weights 08/26/2019 07/23/2019 01/03/2019  Weight (lbs) 249 lb 1.9 oz 250 lb 275 lb  Weight (kg) 113 kg 113.399 kg 124.739 kg  Some encounter information is confidential and restricted. Go to Review Flowsheets activity to see all data.      Telemetry    SR rates mostly in the 60s.  Minimal ectopy.- Personally Reviewed  ECG    No new EKG today.- Personally Reviewed  Physical Exam   General appearance: alert, cooperative, appears stated age, moderately obese and Still somewhat anxious, but happy to hear about OR date Neck: no adenopathy, no carotid bruit and Trivial JVD Lungs: clear to auscultation bilaterally, normal percussion bilaterally and Nonlabored, good air  movement Heart: regular rate and rhythm, S1, S2 normal, no murmur, click, rub or gallop and normal apical impulse Abdomen: soft, non-tender; bowel sounds normal; no masses,  no organomegaly Extremities: extremities normal, atraumatic, no cyanosis or edema Pulses: 2+ and symmetric Neurologic: Alert and oriented X 3, normal strength and tone. Normal symmetric reflexes. Normal coordination and gait   Labs    High Sensitivity Troponin:   Recent Labs  Lab 08/15/19 0003 08/26/19 2044 08/26/19 2228 08/27/19 0203 08/27/19 0349  TROPONINIHS 14 22* 23* 28* 30*      Chemistry Recent Labs  Lab 08/26/19 2044 08/26/19 2233 08/27/19 0349 08/27/19 0813 08/27/19 1321 08/27/19 1321 08/27/19 1729 08/28/19 0305 08/29/19 0001  NA 128*   < > 135   < > 131*   < > 132* 134* 134*  K 4.9   < > 4.8   < > 3.6   < > 3.4* 3.6 3.7  CL 92*   < > 100   < > 97*   < > 101 102 101  CO2 16*   < > 21*   < > 20*  --   --  21* 21*  GLUCOSE 639*   < > 164*   < > 235*   < > 160* 127* 120*  BUN 18   < > 16   < > 13   < > 12 12 11   CREATININE 1.01   < > 0.64   < > 0.44*   < > 0.40* 0.75 0.60*  CALCIUM 10.4*   < > 9.7   < > 8.8*  --   --  8.7* 8.8*  PROT 7.5  --   --   --   --   --   --  6.0* 5.8*  ALBUMIN 3.9  --   --   --   --   --   --  3.0* 3.1*  AST 25  --   --   --   --   --   --  21 20  ALT 23  --   --   --   --   --   --  23 28  ALKPHOS 70  --   --   --   --   --   --  49 47  BILITOT 1.7*   < > 1.5*  --   --   --   --  0.5 0.4  GFRNONAA >60   < > >60   < > >60  --   --  >60 >60  GFRAA >60   < > >60   < > >  60  --   --  >60 >60  ANIONGAP 20*   < > 14   < > 14  --   --  11 12   < > = values in this interval not displayed.     Hematology Recent Labs  Lab 08/26/19 2044 08/26/19 2233 08/27/19 1729 08/28/19 0305 08/29/19 0001  WBC 9.8  --   --  9.9 9.9  RBC 5.22  --   --  4.64 4.37  HGB 17.2*   < > 14.3 14.4 13.5  HCT 47.2   < > 42.0 42.4 40.1  MCV 90.4  --   --  91.4 91.8  MCH 33.0  --   --   31.0 30.9  MCHC 36.4*  --   --  34.0 33.7  RDW 13.1  --   --  13.6 13.8  PLT 254  --   --  193 186   < > = values in this interval not displayed.    BNPNo results for input(s): BNP, PROBNP in the last 168 hours.   DDimer  Recent Labs  Lab 08/27/19 0349  DDIMER 0.60*     Radiology    CT CHEST WO CONTRAST  Result Date: 08/28/2019 CLINICAL DATA:  Preoperative evaluation for bypass surgery, aortic disease EXAM: CT CHEST WITHOUT CONTRAST TECHNIQUE: Multidetector CT imaging of the chest was performed following the standard protocol without IV contrast. COMPARISON:  08/26/2019 FINDINGS: Cardiovascular: There is diffuse atherosclerosis throughout the coronary vasculature. Minimal calcification of the aortic valve. Mild calcified atheromatous plaque involving the distal aortic arch and descending thoracic aorta. No pericardial effusion. Mediastinum/Nodes: No enlarged mediastinal or axillary lymph nodes. Thyroid gland, trachea, and esophagus demonstrate no significant findings. Lungs/Pleura: Mild upper lobe predominant emphysema. No superimposed airspace disease, effusion, or pneumothorax. Central airways are widely patent. Upper Abdomen: No acute abnormality. Musculoskeletal: No chest wall mass or suspicious bone lesions identified. Reconstructed images demonstrate no additional findings. IMPRESSION: 1. Mild calcified atheromatous plaque of the distal aortic arch and descending thoracic aorta. 2. Minimal calcification of the aortic valve. 3. Diffuse coronary artery atherosclerosis. 4. Mild emphysema.  No acute airspace disease. Aortic Atherosclerosis (ICD10-I70.0) and Emphysema (ICD10-J43.9). Electronically Signed   By: Sharlet Salina M.D.   On: 08/28/2019 12:33   NM Myocar Multi W/Spect W/Wall Motion / EF  Result Date: 08/27/2019  There was no ST segment deviation noted during stress.  Findings consistent with ischemia and prior myocardial infarction.  This is an intermediate risk study.  The left  ventricular ejection fraction is mildly decreased (45-54%).  Large area of inferior lateal wall infarct at mid and basal level with small area of moderate apical ischemia EF 47% with inferior lateral wall hypokinesis      Cardiac Studies - summary   Cath: 08/27/19:  pRCA 95% ulcerated plaque - Large, dominant vessel; pCx 80%, p-m Cx 85%, mCx 95%, dCx 90% & 50%; ost-prox LAD 75% (long), D1 90%.  EF 45-50% - suggestion of mild distal inferior hypocontractility.  LVEDP 18 mm.  RECOMMENDATION: Angiographic findings were reviewed with Dr. Herbie Baltimore who had seen the patient in cardiology consult.  The patient will be started on IV nitroglycerin drip.  He will to new amlodipine and beta-blocker therapy will be initiated.  Will change statin therapy to atorvastatin 80 mg.  We will add fenofibrate with his marked hypertriglyceridemia and also consider Vascepa post discharge.  He will need stabilization of his diabetic status.  Surgical consultation will be obtained in am.  2D echo reviewed: EF 6065%.  Moderately increased LV posterior wall and severely increased basal septal wall.  GR 1 DD, impaired relaxation.  Normal RV size and function.  Normal right and left atria.  Essentially normal valves.  Patient Profile     59 y.o. male with a hx of poorly controlled insulin-dependent diabetes, hypertension, hyperlipidemia, major depressive disorder, PTSD, pancreatitis who was seen for the evaluation of chest pain at the request of Dr. Hanley Ben.  Assessment & Plan    1. Unstable Angina: Ruled out by troponins, interesting scenario with resting chest pain, not with his routine walking.  -->  However, nuclear stress test ordered secondary to significant risk factors with metabolic syndrome (hypertension, obesity, hyperlipidemia with elevated glycerides and diabetes), along with smoking.  Myoview suggested inferolateral lesion suggestive of prior infarct with peri-infarct ischemia, however echocardiogram did  not show akinesis in this area, only mild hypokinesis suggestive of good viability.  Cath performed with that day showed multivessel CAD, referred for CABG  For now, continue heparin and nitro drip along with aspirin  CVTS consult appreciated, plan for CABG on 08/30/2019; PR testing ordered.   2. Hypertension: stable with current regimen;Became hypotensive overnight on nitroglycerin drip..  Started beta-blocker, would like to use ARB, but with hypotension overnight will hold off until postop.  3. Hyperlipidemia with hypertriglyceridemia: was on pravastatin 80 mg daily (reports compliance?) Triglycerides are significantly elevated, >5000.   Is started on high-dose atorvastatin along with fenofibrate because of triglycerides.  I suspect triglycerides are elevated with DKA; would likely benefit from Vascepa, however he would need to make sure this is covered by [employer].  4. Diabetes on insulin/DKA: Poorly controlled.  A1c 15.5 on 08/05/2019.  Had been 12.7 in 01/2019. -Current treatment for DKA by primary team, IV fluids and insulin drip.  5. Tobacco abuse: 1.5 pack/day smoker  Smoke cessation counseling provided.  6. Major depressive disorder/PTSD - ?  Suicidal ideation:   Seen by psychiatry, medications recommended.  Adamantly denies suicidal ideation.    Major concern is that he has no significant social support, lives by himself.  He indicated his main support is his Child psychotherapist from the [place].  He does not own a car and therefore will need to walk to the grocery store and other locations for necessities.  He is very concerned about recovery.  Will definitely need social work and care management for discharge planning when ready.  For questions or updates, please contact [facility] Please consult [email or web address] for contact info under    Signed, Bryan Lemma, MD ., M.S. Interventional Cardiologist  08/29/2019, 8:05 AM

## 2019-08-29 NOTE — Progress Notes (Signed)
Notify Cardiology MD made him aware of soft BP 87/60 map 70. I titrated nitro gtt down 30 mcg to 15 mcg. Per day RN patient did not tolerate being off Nitro gtt he needed to be on it due to chest pain. I will continue to try to bring Nitro down. MD aware of BP and is okay at this time if patient is tolerating it. I will keep MD update if there is more concern with pressure and maps.

## 2019-08-29 NOTE — Progress Notes (Signed)
patient pain resolved and returned to bed

## 2019-08-29 NOTE — Progress Notes (Signed)
Pt note states that he should be on endo tool the night before his CABG which is scheduled for 7:15am 08/30/2019. Pt does not have orders for an insulin drip. This RN spoke with Dr. Cornelius Moras and he stated that the insulin drip is for after surgery, not before surgery. No new orders given.

## 2019-08-30 ENCOUNTER — Inpatient Hospital Stay (HOSPITAL_COMMUNITY): Payer: No Typology Code available for payment source

## 2019-08-30 ENCOUNTER — Inpatient Hospital Stay (HOSPITAL_COMMUNITY): Payer: No Typology Code available for payment source | Admitting: Certified Registered"

## 2019-08-30 ENCOUNTER — Encounter (HOSPITAL_COMMUNITY)
Admission: EM | Disposition: A | Discharge status: Home / Self Care | Payer: Self-pay | Source: Home / Self Care | Attending: Cardiothoracic Surgery

## 2019-08-30 DIAGNOSIS — Z951 Presence of aortocoronary bypass graft: Secondary | ICD-10-CM

## 2019-08-30 DIAGNOSIS — I25110 Atherosclerotic heart disease of native coronary artery with unstable angina pectoris: Secondary | ICD-10-CM

## 2019-08-30 DIAGNOSIS — E119 Type 2 diabetes mellitus without complications: Secondary | ICD-10-CM

## 2019-08-30 HISTORY — PX: ENDOVEIN HARVEST OF GREATER SAPHENOUS VEIN: SHX5059

## 2019-08-30 HISTORY — DX: Presence of aortocoronary bypass graft: Z95.1

## 2019-08-30 HISTORY — PX: RADIAL ARTERY HARVEST: SHX5067

## 2019-08-30 HISTORY — PX: TEE WITHOUT CARDIOVERSION: SHX5443

## 2019-08-30 HISTORY — PX: CORONARY ARTERY BYPASS GRAFT: SHX141

## 2019-08-30 LAB — POCT I-STAT 7, (LYTES, BLD GAS, ICA,H+H)
Acid-base deficit: 5 mmol/L — ABNORMAL HIGH (ref 0.0–2.0)
Acid-base deficit: 5 mmol/L — ABNORMAL HIGH (ref 0.0–2.0)
Acid-base deficit: 6 mmol/L — ABNORMAL HIGH (ref 0.0–2.0)
Bicarbonate: 26.3 mmol/L (ref 20.0–28.0)
Bicarbonate: 22.4 mmol/L (ref 20.0–28.0)
Bicarbonate: 20.2 mmol/L (ref 20.0–28.0)
Bicarbonate: 19.1 mmol/L — ABNORMAL LOW (ref 20.0–28.0)
Calcium, Ion: 1.08 mmol/L — ABNORMAL LOW (ref 1.15–1.40)
Calcium, Ion: 1.19 mmol/L (ref 1.15–1.40)
Calcium, Ion: 1.17 mmol/L (ref 1.15–1.40)
Calcium, Ion: 1.19 mmol/L (ref 1.15–1.40)
HCT: 30 % — ABNORMAL LOW (ref 39.0–52.0)
HCT: 36 % — ABNORMAL LOW (ref 39.0–52.0)
HCT: 30 % — ABNORMAL LOW (ref 39.0–52.0)
HCT: 28 % — ABNORMAL LOW (ref 39.0–52.0)
Hemoglobin: 10.2 g/dL — ABNORMAL LOW (ref 13.0–17.0)
Hemoglobin: 12.2 g/dL — ABNORMAL LOW (ref 13.0–17.0)
Hemoglobin: 10.2 g/dL — ABNORMAL LOW (ref 13.0–17.0)
Hemoglobin: 9.5 g/dL — ABNORMAL LOW (ref 13.0–17.0)
O2 Saturation: 100 %
O2 Saturation: 91 %
O2 Saturation: 96 %
O2 Saturation: 91 %
Patient temperature: 37
Patient temperature: 37.4
Patient temperature: 38
Potassium: 3.8 mmol/L (ref 3.5–5.1)
Potassium: 4.2 mmol/L (ref 3.5–5.1)
Potassium: 3.9 mmol/L (ref 3.5–5.1)
Potassium: 4.1 mmol/L (ref 3.5–5.1)
Sodium: 141 mmol/L (ref 135–145)
Sodium: 141 mmol/L (ref 135–145)
Sodium: 143 mmol/L (ref 135–145)
Sodium: 143 mmol/L (ref 135–145)
TCO2: 28 mmol/L (ref 22–32)
TCO2: 24 mmol/L (ref 22–32)
TCO2: 21 mmol/L — ABNORMAL LOW (ref 22–32)
TCO2: 20 mmol/L — ABNORMAL LOW (ref 22–32)
pCO2 arterial: 53.3 mmHg — ABNORMAL HIGH (ref 32.0–48.0)
pCO2 arterial: 50.2 mmHg — ABNORMAL HIGH (ref 32.0–48.0)
pCO2 arterial: 38.1 mmHg (ref 32.0–48.0)
pCO2 arterial: 36.5 mmHg (ref 32.0–48.0)
pH, Arterial: 7.302 — ABNORMAL LOW (ref 7.350–7.450)
pH, Arterial: 7.257 — ABNORMAL LOW (ref 7.350–7.450)
pH, Arterial: 7.334 — ABNORMAL LOW (ref 7.350–7.450)
pH, Arterial: 7.332 — ABNORMAL LOW (ref 7.350–7.450)
pO2, Arterial: 375 mmHg — ABNORMAL HIGH (ref 83.0–108.0)
pO2, Arterial: 70 mmHg — ABNORMAL LOW (ref 83.0–108.0)
pO2, Arterial: 86 mmHg (ref 83.0–108.0)
pO2, Arterial: 68 mmHg — ABNORMAL LOW (ref 83.0–108.0)

## 2019-08-30 LAB — CBC WITH DIFFERENTIAL/PLATELET
Abs Immature Granulocytes: 0.05 10*3/uL (ref 0.00–0.07)
Basophils Absolute: 0 10*3/uL (ref 0.0–0.1)
Basophils Relative: 1 %
Eosinophils Absolute: 0.5 10*3/uL (ref 0.0–0.5)
Eosinophils Relative: 6 %
HCT: 42.6 % (ref 39.0–52.0)
Hemoglobin: 14.1 g/dL (ref 13.0–17.0)
Immature Granulocytes: 1 %
Lymphocytes Relative: 41 %
Lymphs Abs: 3.3 10*3/uL (ref 0.7–4.0)
MCH: 31 pg (ref 26.0–34.0)
MCHC: 33.1 g/dL (ref 30.0–36.0)
MCV: 93.6 fL (ref 80.0–100.0)
Monocytes Absolute: 0.7 10*3/uL (ref 0.1–1.0)
Monocytes Relative: 9 %
Neutro Abs: 3.3 10*3/uL (ref 1.7–7.7)
Neutrophils Relative %: 42 %
Platelets: 199 10*3/uL (ref 150–400)
RBC: 4.55 MIL/uL (ref 4.22–5.81)
RDW: 14 % (ref 11.5–15.5)
WBC: 7.9 10*3/uL (ref 4.0–10.5)
nRBC: 0 % (ref 0.0–0.2)

## 2019-08-30 LAB — COMPREHENSIVE METABOLIC PANEL
ALT: 27 U/L (ref 0–44)
AST: 23 U/L (ref 15–41)
Albumin: 3 g/dL — ABNORMAL LOW (ref 3.5–5.0)
Alkaline Phosphatase: 49 U/L (ref 38–126)
Anion gap: 13 (ref 5–15)
BUN: 9 mg/dL (ref 6–20)
CO2: 24 mmol/L (ref 22–32)
Calcium: 9.2 mg/dL (ref 8.9–10.3)
Chloride: 101 mmol/L (ref 98–111)
Creatinine, Ser: 0.63 mg/dL (ref 0.61–1.24)
GFR calc Af Amer: >60 mL/min (ref >60)
GFR calc non Af Amer: >60 mL/min (ref >60)
Glucose, Bld: 290 mg/dL — ABNORMAL HIGH (ref 70–99)
Potassium: 4.1 mmol/L (ref 3.5–5.1)
Sodium: 138 mmol/L (ref 135–145)
Total Bilirubin: 0.9 mg/dL (ref 0.3–1.2)
Total Protein: 5.9 g/dL — ABNORMAL LOW (ref 6.5–8.1)

## 2019-08-30 LAB — POCT I-STAT, CHEM 8
BUN: 8 mg/dL (ref 6–20)
BUN: 7 mg/dL (ref 6–20)
BUN: 7 mg/dL (ref 6–20)
BUN: 6 mg/dL (ref 6–20)
BUN: 6 mg/dL (ref 6–20)
BUN: 6 mg/dL (ref 6–20)
Calcium, Ion: 1.31 mmol/L (ref 1.15–1.40)
Calcium, Ion: 1.32 mmol/L (ref 1.15–1.40)
Calcium, Ion: 1.33 mmol/L (ref 1.15–1.40)
Calcium, Ion: 1.12 mmol/L — ABNORMAL LOW (ref 1.15–1.40)
Calcium, Ion: 1.15 mmol/L (ref 1.15–1.40)
Calcium, Ion: 1.15 mmol/L (ref 1.15–1.40)
Chloride: 104 mmol/L (ref 98–111)
Chloride: 106 mmol/L (ref 98–111)
Chloride: 106 mmol/L (ref 98–111)
Chloride: 106 mmol/L (ref 98–111)
Chloride: 106 mmol/L (ref 98–111)
Chloride: 106 mmol/L (ref 98–111)
Creatinine, Ser: 0.4 mg/dL — ABNORMAL LOW (ref 0.61–1.24)
Creatinine, Ser: 0.5 mg/dL — ABNORMAL LOW (ref 0.61–1.24)
Creatinine, Ser: 0.5 mg/dL — ABNORMAL LOW (ref 0.61–1.24)
Creatinine, Ser: 0.4 mg/dL — ABNORMAL LOW (ref 0.61–1.24)
Creatinine, Ser: 0.4 mg/dL — ABNORMAL LOW (ref 0.61–1.24)
Creatinine, Ser: 0.5 mg/dL — ABNORMAL LOW (ref 0.61–1.24)
Glucose, Bld: 226 mg/dL — ABNORMAL HIGH (ref 70–99)
Glucose, Bld: 176 mg/dL — ABNORMAL HIGH (ref 70–99)
Glucose, Bld: 133 mg/dL — ABNORMAL HIGH (ref 70–99)
Glucose, Bld: 107 mg/dL — ABNORMAL HIGH (ref 70–99)
Glucose, Bld: 144 mg/dL — ABNORMAL HIGH (ref 70–99)
Glucose, Bld: 229 mg/dL — ABNORMAL HIGH (ref 70–99)
HCT: 37 % — ABNORMAL LOW (ref 39.0–52.0)
HCT: 37 % — ABNORMAL LOW (ref 39.0–52.0)
HCT: 37 % — ABNORMAL LOW (ref 39.0–52.0)
HCT: 29 % — ABNORMAL LOW (ref 39.0–52.0)
HCT: 28 % — ABNORMAL LOW (ref 39.0–52.0)
HCT: 26 % — ABNORMAL LOW (ref 39.0–52.0)
Hemoglobin: 12.6 g/dL — ABNORMAL LOW (ref 13.0–17.0)
Hemoglobin: 12.6 g/dL — ABNORMAL LOW (ref 13.0–17.0)
Hemoglobin: 12.6 g/dL — ABNORMAL LOW (ref 13.0–17.0)
Hemoglobin: 9.9 g/dL — ABNORMAL LOW (ref 13.0–17.0)
Hemoglobin: 9.5 g/dL — ABNORMAL LOW (ref 13.0–17.0)
Hemoglobin: 8.8 g/dL — ABNORMAL LOW (ref 13.0–17.0)
Potassium: 3.8 mmol/L (ref 3.5–5.1)
Potassium: 3.5 mmol/L (ref 3.5–5.1)
Potassium: 3.9 mmol/L (ref 3.5–5.1)
Potassium: 4.4 mmol/L (ref 3.5–5.1)
Potassium: 4.3 mmol/L (ref 3.5–5.1)
Potassium: 4.2 mmol/L (ref 3.5–5.1)
Sodium: 139 mmol/L (ref 135–145)
Sodium: 139 mmol/L (ref 135–145)
Sodium: 141 mmol/L (ref 135–145)
Sodium: 138 mmol/L (ref 135–145)
Sodium: 138 mmol/L (ref 135–145)
Sodium: 139 mmol/L (ref 135–145)
TCO2: 26 mmol/L (ref 22–32)
TCO2: 25 mmol/L (ref 22–32)
TCO2: 28 mmol/L (ref 22–32)
TCO2: 26 mmol/L (ref 22–32)
TCO2: 25 mmol/L (ref 22–32)
TCO2: 23 mmol/L (ref 22–32)

## 2019-08-30 LAB — BASIC METABOLIC PANEL
Anion gap: 9 (ref 5–15)
BUN: 7 mg/dL (ref 6–20)
CO2: 19 mmol/L — ABNORMAL LOW (ref 22–32)
Calcium: 7.8 mg/dL — ABNORMAL LOW (ref 8.9–10.3)
Chloride: 113 mmol/L — ABNORMAL HIGH (ref 98–111)
Creatinine, Ser: 0.72 mg/dL (ref 0.61–1.24)
GFR calc Af Amer: >60 mL/min (ref >60)
GFR calc non Af Amer: >60 mL/min (ref >60)
Glucose, Bld: 117 mg/dL — ABNORMAL HIGH (ref 70–99)
Potassium: 4.2 mmol/L (ref 3.5–5.1)
Sodium: 141 mmol/L (ref 135–145)

## 2019-08-30 LAB — ECHO INTRAOPERATIVE TEE
Height: 70 in
Weight: 3809.55 oz

## 2019-08-30 LAB — TRIGLYCERIDES: Triglycerides: 843 mg/dL — ABNORMAL HIGH (ref <150)

## 2019-08-30 LAB — GLUCOSE, CAPILLARY
Glucose-Capillary: 268 mg/dL — ABNORMAL HIGH (ref 70–99)
Glucose-Capillary: 179 mg/dL — ABNORMAL HIGH (ref 70–99)
Glucose-Capillary: 157 mg/dL — ABNORMAL HIGH (ref 70–99)
Glucose-Capillary: 151 mg/dL — ABNORMAL HIGH (ref 70–99)
Glucose-Capillary: 131 mg/dL — ABNORMAL HIGH (ref 70–99)
Glucose-Capillary: 141 mg/dL — ABNORMAL HIGH (ref 70–99)
Glucose-Capillary: 127 mg/dL — ABNORMAL HIGH (ref 70–99)
Glucose-Capillary: 121 mg/dL — ABNORMAL HIGH (ref 70–99)
Glucose-Capillary: 144 mg/dL — ABNORMAL HIGH (ref 70–99)
Glucose-Capillary: 133 mg/dL — ABNORMAL HIGH (ref 70–99)
Glucose-Capillary: 126 mg/dL — ABNORMAL HIGH (ref 70–99)

## 2019-08-30 LAB — CBC
HCT: 29.9 % — ABNORMAL LOW (ref 39.0–52.0)
Hemoglobin: 9.7 g/dL — ABNORMAL LOW (ref 13.0–17.0)
MCH: 31.2 pg (ref 26.0–34.0)
MCHC: 32.4 g/dL (ref 30.0–36.0)
MCV: 96.1 fL (ref 80.0–100.0)
Platelets: 133 10*3/uL — ABNORMAL LOW (ref 150–400)
RBC: 3.11 MIL/uL — ABNORMAL LOW (ref 4.22–5.81)
RDW: 14.2 % (ref 11.5–15.5)
WBC: 16.4 10*3/uL — ABNORMAL HIGH (ref 4.0–10.5)
nRBC: 0 % (ref 0.0–0.2)

## 2019-08-30 LAB — PLATELET COUNT: Platelets: 125 10*3/uL — ABNORMAL LOW (ref 150–400)

## 2019-08-30 LAB — APTT: aPTT: 30 seconds (ref 24–36)

## 2019-08-30 LAB — BRAIN NATRIURETIC PEPTIDE: B Natriuretic Peptide: 59.6 pg/mL (ref 0.0–100.0)

## 2019-08-30 LAB — MAGNESIUM
Magnesium: 1.9 mg/dL (ref 1.7–2.4)
Magnesium: 2.4 mg/dL (ref 1.7–2.4)

## 2019-08-30 LAB — PROTIME-INR
INR: 1.3 — ABNORMAL HIGH (ref 0.8–1.2)
Prothrombin Time: 15.6 seconds — ABNORMAL HIGH (ref 11.4–15.2)

## 2019-08-30 LAB — HEMOGLOBIN AND HEMATOCRIT, BLOOD
HCT: 29.3 % — ABNORMAL LOW (ref 39.0–52.0)
Hemoglobin: 9.7 g/dL — ABNORMAL LOW (ref 13.0–17.0)

## 2019-08-30 LAB — HEPARIN LEVEL (UNFRACTIONATED): Heparin Unfractionated: 0.33 IU/mL (ref 0.30–0.70)

## 2019-08-30 SURGERY — CORONARY ARTERY BYPASS GRAFTING (CABG)
Anesthesia: General | Site: Leg Upper | Laterality: Right

## 2019-08-30 MED ORDER — ASPIRIN EC 325 MG PO TBEC: 325.0000 mg | DELAYED_RELEASE_TABLET | Freq: Every day | ORAL | Status: DC

## 2019-08-30 MED ORDER — HEPARIN SODIUM (PORCINE) 1000 UNIT/ML IJ SOLN
INTRAMUSCULAR | Status: AC
  Filled 2019-08-30: qty 1

## 2019-08-30 MED ORDER — LACTATED RINGERS IV SOLN: INTRAVENOUS | Status: DC | PRN

## 2019-08-30 MED ORDER — PLASMA-LYTE 148 IV SOLN
INTRAVENOUS | Status: DC | PRN
  Administered 2019-08-30: 500 mL

## 2019-08-30 MED ORDER — CHLORHEXIDINE GLUCONATE CLOTH 2 % EX PADS
6.0000 | MEDICATED_PAD | Freq: Every day | CUTANEOUS | Status: DC
  Administered 2019-08-30 – 2019-09-06 (×8): 6 via TOPICAL

## 2019-08-30 MED ORDER — FAMOTIDINE IN NACL 20-0.9 MG/50ML-% IV SOLN
INTRAVENOUS | Status: AC
  Filled 2019-08-30: qty 50

## 2019-08-30 MED ORDER — LACTATED RINGERS IV SOLN: INTRAVENOUS | Status: DC

## 2019-08-30 MED ORDER — FAMOTIDINE IN NACL 20-0.9 MG/50ML-% IV SOLN
20.0000 mg | Freq: Two times a day (BID) | INTRAVENOUS | Status: DC
  Administered 2019-08-30: 20 mg via INTRAVENOUS

## 2019-08-30 MED ORDER — DEXMEDETOMIDINE HCL IN NACL 400 MCG/100ML IV SOLN
0.0000 ug/kg/h | INTRAVENOUS | Status: AC
  Administered 2019-08-30: 20:00:00 0.2 ug/kg/h via INTRAVENOUS
  Administered 2019-08-30: 0.7 ug/kg/h via INTRAVENOUS
  Administered 2019-08-31: 04:00:00 0.6 ug/kg/h via INTRAVENOUS
  Filled 2019-08-30 (×3): qty 100

## 2019-08-30 MED ORDER — EPINEPHRINE 1 MG/10ML IJ SOSY
PREFILLED_SYRINGE | INTRAMUSCULAR | Status: AC
  Filled 2019-08-30: qty 20

## 2019-08-30 MED ORDER — NITROGLYCERIN IN D5W 200-5 MCG/ML-% IV SOLN: 7.0000 ug/min | INTRAVENOUS | Status: DC

## 2019-08-30 MED ORDER — SODIUM CHLORIDE 0.9% FLUSH
3.0000 mL | INTRAVENOUS | Status: DC | PRN
  Administered 2019-09-01: 18:00:00 3 mL via INTRAVENOUS

## 2019-08-30 MED ORDER — PROPOFOL 10 MG/ML IV BOLUS
INTRAVENOUS | Status: AC
  Filled 2019-08-30: qty 20

## 2019-08-30 MED ORDER — EPINEPHRINE PF 1 MG/ML IJ SOLN
2.0000 ug/min | INTRAVENOUS | Status: DC
  Filled 2019-08-30: qty 4

## 2019-08-30 MED ORDER — POTASSIUM CHLORIDE 10 MEQ/50ML IV SOLN
INTRAVENOUS | Status: AC
  Filled 2019-08-30: qty 150

## 2019-08-30 MED ORDER — SODIUM CHLORIDE 0.9 % IV SOLN
1.0000 g | Freq: Once | INTRAVENOUS | Status: AC
  Administered 2019-08-31: 1 g via INTRAVENOUS
  Filled 2019-08-30: qty 10

## 2019-08-30 MED ORDER — PROTAMINE SULFATE 10 MG/ML IV SOLN
INTRAVENOUS | Status: AC
  Filled 2019-08-30: qty 5

## 2019-08-30 MED ORDER — HEPARIN SODIUM (PORCINE) 1000 UNIT/ML IJ SOLN
INTRAMUSCULAR | Status: DC | PRN
  Administered 2019-08-30: 34000 [IU] via INTRAVENOUS
  Administered 2019-08-30: 5000 [IU] via INTRAVENOUS

## 2019-08-30 MED ORDER — KETOROLAC TROMETHAMINE 15 MG/ML IJ SOLN
15.0000 mg | Freq: Once | INTRAMUSCULAR | Status: AC
  Administered 2019-08-30: 19:00:00 15 mg via INTRAVENOUS
  Filled 2019-08-30: qty 1

## 2019-08-30 MED ORDER — MAGNESIUM SULFATE 4 GM/100ML IV SOLN
4.0000 g | Freq: Once | INTRAVENOUS | Status: AC
  Administered 2019-08-30: 4 g via INTRAVENOUS
  Filled 2019-08-30: qty 100

## 2019-08-30 MED ORDER — DOCUSATE SODIUM 100 MG PO CAPS
200.0000 mg | ORAL_CAPSULE | Freq: Every day | ORAL | Status: DC
  Administered 2019-08-31 – 2019-09-05 (×6): 200 mg via ORAL
  Filled 2019-08-30 (×7): qty 2

## 2019-08-30 MED ORDER — METOPROLOL TARTRATE 12.5 MG HALF TABLET
12.5000 mg | ORAL_TABLET | Freq: Two times a day (BID) | ORAL | Status: DC
  Administered 2019-08-30: 12.5 mg via ORAL
  Filled 2019-08-30: qty 1

## 2019-08-30 MED ORDER — 0.9 % SODIUM CHLORIDE (POUR BTL) OPTIME
TOPICAL | Status: DC | PRN
  Administered 2019-08-30: 5000 mL

## 2019-08-30 MED ORDER — ARTIFICIAL TEARS OPHTHALMIC OINT
TOPICAL_OINTMENT | OPHTHALMIC | Status: AC
  Filled 2019-08-30: qty 3.5

## 2019-08-30 MED ORDER — VANCOMYCIN HCL 1000 MG IV SOLR
INTRAVENOUS | Status: DC | PRN
  Administered 2019-08-30: 3 g via TOPICAL

## 2019-08-30 MED ORDER — BUPIVACAINE LIPOSOME 1.3 % IJ SUSP
INTRAMUSCULAR | Status: DC | PRN
  Administered 2019-08-30: 20 mL

## 2019-08-30 MED ORDER — OXYCODONE HCL 5 MG PO TABS
5.0000 mg | ORAL_TABLET | ORAL | Status: DC | PRN
  Administered 2019-08-30 – 2019-09-03 (×21): 10 mg via ORAL
  Filled 2019-08-30 (×21): qty 2

## 2019-08-30 MED ORDER — ONDANSETRON HCL 4 MG/2ML IJ SOLN
INTRAMUSCULAR | Status: DC | PRN
  Administered 2019-08-30: 4 mg via INTRAVENOUS

## 2019-08-30 MED ORDER — PROTAMINE SULFATE 10 MG/ML IV SOLN
INTRAVENOUS | Status: AC
  Filled 2019-08-30: qty 25

## 2019-08-30 MED ORDER — POTASSIUM CHLORIDE 10 MEQ/50ML IV SOLN: 10.0000 meq | INTRAVENOUS | Status: AC

## 2019-08-30 MED ORDER — SODIUM CHLORIDE 0.9% FLUSH
3.0000 mL | Freq: Two times a day (BID) | INTRAVENOUS | Status: DC
  Administered 2019-09-01 – 2019-09-05 (×6): 3 mL via INTRAVENOUS

## 2019-08-30 MED ORDER — SODIUM CHLORIDE 0.9 % IV SOLN: INTRAVENOUS | Status: DC | PRN

## 2019-08-30 MED ORDER — CALCIUM CHLORIDE 10 % IV SOLN
INTRAVENOUS | Status: AC
  Filled 2019-08-30: qty 10

## 2019-08-30 MED ORDER — ACETAMINOPHEN 500 MG PO TABS
1000.0000 mg | ORAL_TABLET | Freq: Four times a day (QID) | ORAL | Status: AC
  Administered 2019-08-30 – 2019-09-04 (×17): 1000 mg via ORAL
  Filled 2019-08-30 (×17): qty 2

## 2019-08-30 MED ORDER — SODIUM CHLORIDE 0.9 % IV SOLN
1.5000 g | Freq: Two times a day (BID) | INTRAVENOUS | Status: AC
  Administered 2019-08-30 – 2019-09-01 (×4): 1.5 g via INTRAVENOUS
  Filled 2019-08-30 (×4): qty 1.5

## 2019-08-30 MED ORDER — SODIUM CHLORIDE 0.9 % IV SOLN: INTRAVENOUS | Status: DC

## 2019-08-30 MED ORDER — STERILE WATER FOR INJECTION IJ SOLN
INTRAMUSCULAR | Status: DC | PRN
  Administered 2019-08-30: 10:00:00 20 mL via INTRAVASCULAR

## 2019-08-30 MED ORDER — DEXMEDETOMIDINE HCL IN NACL 200 MCG/50ML IV SOLN
INTRAVENOUS | Status: AC
  Filled 2019-08-30: qty 50

## 2019-08-30 MED ORDER — VANCOMYCIN HCL IN DEXTROSE 1-5 GM/200ML-% IV SOLN
1000.0000 mg | Freq: Once | INTRAVENOUS | Status: AC
  Administered 2019-08-30: 1000 mg via INTRAVENOUS
  Filled 2019-08-30: qty 200

## 2019-08-30 MED ORDER — CHLORHEXIDINE GLUCONATE 0.12% ORAL RINSE (MEDLINE KIT)
15.0000 mL | Freq: Two times a day (BID) | OROMUCOSAL | Status: DC
  Administered 2019-08-30 – 2019-09-05 (×7): 15 mL via OROMUCOSAL

## 2019-08-30 MED ORDER — ALBUMIN HUMAN 5 % IV SOLN
250.0000 mL | INTRAVENOUS | Status: AC | PRN
  Administered 2019-08-30 (×4): 12.5 g via INTRAVENOUS
  Filled 2019-08-30: qty 500

## 2019-08-30 MED ORDER — BUPIVACAINE LIPOSOME 1.3 % IJ SUSP
20.0000 mL | Freq: Once | INTRAMUSCULAR | Status: DC
  Filled 2019-08-30: qty 20

## 2019-08-30 MED ORDER — MIDAZOLAM HCL 5 MG/5ML IJ SOLN
INTRAMUSCULAR | Status: DC | PRN
  Administered 2019-08-30: 2 mg via INTRAVENOUS
  Administered 2019-08-30: 1 mg via INTRAVENOUS
  Administered 2019-08-30: 2 mg via INTRAVENOUS
  Administered 2019-08-30: 1 mg via INTRAVENOUS
  Administered 2019-08-30: 2 mg via INTRAVENOUS

## 2019-08-30 MED ORDER — ACETAMINOPHEN 650 MG RE SUPP
RECTAL | Status: AC
  Filled 2019-08-30: qty 1

## 2019-08-30 MED ORDER — TRANEXAMIC ACID 1000 MG/10ML IV SOLN
1.5000 mg/kg/h | INTRAVENOUS | Status: DC
  Filled 2019-08-30: qty 25

## 2019-08-30 MED ORDER — ACETAMINOPHEN 160 MG/5ML PO SOLN: 650.0000 mg | Freq: Once | ORAL | Status: AC

## 2019-08-30 MED ORDER — ALBUMIN HUMAN 5 % IV SOLN
250.0000 mL | INTRAVENOUS | Status: AC | PRN
  Administered 2019-08-31 (×2): 12.5 g via INTRAVENOUS
  Filled 2019-08-30: qty 500
  Filled 2019-08-30 (×2): qty 250

## 2019-08-30 MED ORDER — SODIUM CHLORIDE 0.45 % IV SOLN: INTRAVENOUS | Status: DC | PRN

## 2019-08-30 MED ORDER — ALBUMIN HUMAN 5 % IV SOLN
INTRAVENOUS | Status: AC
  Filled 2019-08-30: qty 500

## 2019-08-30 MED ORDER — FENTANYL CITRATE (PF) 250 MCG/5ML IJ SOLN
INTRAMUSCULAR | Status: AC
  Filled 2019-08-30: qty 25

## 2019-08-30 MED ORDER — MIDAZOLAM HCL 2 MG/2ML IJ SOLN: 2.0000 mg | INTRAMUSCULAR | Status: DC | PRN

## 2019-08-30 MED ORDER — METOPROLOL TARTRATE 5 MG/5ML IV SOLN: 2.5000 mg | INTRAVENOUS | Status: DC | PRN

## 2019-08-30 MED ORDER — PHENYLEPHRINE 40 MCG/ML (10ML) SYRINGE FOR IV PUSH (FOR BLOOD PRESSURE SUPPORT)
PREFILLED_SYRINGE | INTRAVENOUS | Status: AC
  Filled 2019-08-30: qty 20

## 2019-08-30 MED ORDER — TRAMADOL HCL 50 MG PO TABS
50.0000 mg | ORAL_TABLET | ORAL | Status: DC | PRN
  Administered 2019-08-30 – 2019-09-05 (×6): 100 mg via ORAL
  Filled 2019-08-30 (×6): qty 2

## 2019-08-30 MED ORDER — CHLORHEXIDINE GLUCONATE 0.12 % MT SOLN
15.0000 mL | OROMUCOSAL | Status: AC
  Administered 2019-08-30: 15 mL via OROMUCOSAL

## 2019-08-30 MED ORDER — VASOPRESSIN 20 UNIT/ML IV SOLN
0.0400 [IU]/min | INTRAVENOUS | Status: DC
  Administered 2019-08-30: 0.04 [IU]/min via INTRAVENOUS
  Filled 2019-08-30 (×3): qty 2

## 2019-08-30 MED ORDER — DEXTROSE 50 % IV SOLN: 0.0000 mL | INTRAVENOUS | Status: DC | PRN

## 2019-08-30 MED ORDER — SODIUM BICARBONATE 8.4 % IV SOLN
INTRAVENOUS | Status: AC
  Filled 2019-08-30: qty 100

## 2019-08-30 MED ORDER — ACETAMINOPHEN 650 MG RE SUPP
650.0000 mg | Freq: Once | RECTAL | Status: AC
  Administered 2019-08-30: 16:00:00 650 mg via RECTAL

## 2019-08-30 MED ORDER — PHENYLEPHRINE HCL-NACL 10-0.9 MG/250ML-% IV SOLN
INTRAVENOUS | Status: DC | PRN
  Administered 2019-08-30: 20 ug/min via INTRAVENOUS

## 2019-08-30 MED ORDER — FENTANYL CITRATE (PF) 250 MCG/5ML IJ SOLN
INTRAMUSCULAR | Status: DC | PRN
  Administered 2019-08-30: 50 ug via INTRAVENOUS
  Administered 2019-08-30 (×3): 100 ug via INTRAVENOUS
  Administered 2019-08-30 (×2): 50 ug via INTRAVENOUS
  Administered 2019-08-30: 100 ug via INTRAVENOUS

## 2019-08-30 MED ORDER — BISACODYL 10 MG RE SUPP: 10.0000 mg | Freq: Every day | RECTAL | Status: DC

## 2019-08-30 MED ORDER — LACTATED RINGERS IV SOLN
500.0000 mL | Freq: Once | INTRAVENOUS | Status: AC | PRN
  Administered 2019-08-30: 500 mL via INTRAVENOUS

## 2019-08-30 MED ORDER — ROCURONIUM BROMIDE 10 MG/ML (PF) SYRINGE
PREFILLED_SYRINGE | INTRAVENOUS | Status: AC
  Filled 2019-08-30: qty 30

## 2019-08-30 MED ORDER — MORPHINE SULFATE (PF) 2 MG/ML IV SOLN
1.0000 mg | INTRAVENOUS | Status: DC | PRN
  Administered 2019-08-30: 4 mg via INTRAVENOUS
  Administered 2019-08-30: 2 mg via INTRAVENOUS
  Administered 2019-08-30: 1 mg via INTRAVENOUS
  Administered 2019-08-30 – 2019-09-01 (×5): 4 mg via INTRAVENOUS
  Administered 2019-09-01 – 2019-09-02 (×5): 2 mg via INTRAVENOUS
  Filled 2019-08-30: qty 2
  Filled 2019-08-30 (×7): qty 1
  Filled 2019-08-30: qty 2
  Filled 2019-08-30: qty 1
  Filled 2019-08-30 (×3): qty 2
  Filled 2019-08-30: qty 1
  Filled 2019-08-30: qty 2

## 2019-08-30 MED ORDER — PROPOFOL 10 MG/ML IV BOLUS
INTRAVENOUS | Status: DC | PRN
  Administered 2019-08-30: 80 mg via INTRAVENOUS

## 2019-08-30 MED ORDER — BISACODYL 5 MG PO TBEC
10.0000 mg | DELAYED_RELEASE_TABLET | Freq: Every day | ORAL | Status: DC
  Administered 2019-08-31 – 2019-09-04 (×5): 10 mg via ORAL
  Filled 2019-08-30 (×7): qty 2

## 2019-08-30 MED ORDER — PHENYLEPHRINE HCL-NACL 20-0.9 MG/250ML-% IV SOLN
0.0000 ug/min | INTRAVENOUS | Status: DC
  Administered 2019-08-30 (×2): 100 ug/min via INTRAVENOUS
  Administered 2019-08-31: 50 ug/min via INTRAVENOUS
  Administered 2019-08-31: 100 ug/min via INTRAVENOUS
  Administered 2019-08-31: 12:00:00 50 ug/min via INTRAVENOUS
  Administered 2019-09-01: 01:00:00 55 ug/min via INTRAVENOUS
  Administered 2019-09-01: 06:00:00 50 ug/min via INTRAVENOUS
  Administered 2019-09-01: 20 ug/min via INTRAVENOUS
  Administered 2019-09-01: 14:00:00 50 ug/min via INTRAVENOUS
  Administered 2019-09-02: 08:00:00 40 ug/min via INTRAVENOUS
  Filled 2019-08-30 (×6): qty 250
  Filled 2019-08-30: qty 500
  Filled 2019-08-30 (×2): qty 250

## 2019-08-30 MED ORDER — HEMOSTATIC AGENTS (NO CHARGE) OPTIME
TOPICAL | Status: DC | PRN
  Administered 2019-08-30 (×4): 1 via TOPICAL

## 2019-08-30 MED ORDER — ROCURONIUM BROMIDE 10 MG/ML (PF) SYRINGE
PREFILLED_SYRINGE | INTRAVENOUS | Status: DC | PRN
  Administered 2019-08-30: 50 mg via INTRAVENOUS
  Administered 2019-08-30: 40 mg via INTRAVENOUS
  Administered 2019-08-30: 50 mg via INTRAVENOUS
  Administered 2019-08-30: 100 mg via INTRAVENOUS

## 2019-08-30 MED ORDER — ONDANSETRON HCL 4 MG/2ML IJ SOLN
INTRAMUSCULAR | Status: AC
  Filled 2019-08-30: qty 2

## 2019-08-30 MED ORDER — ONDANSETRON HCL 4 MG/2ML IJ SOLN: 4.0000 mg | Freq: Four times a day (QID) | INTRAMUSCULAR | Status: DC | PRN

## 2019-08-30 MED ORDER — SODIUM CHLORIDE 0.9 % IV SOLN: 250.0000 mL | INTRAVENOUS | Status: DC

## 2019-08-30 MED ORDER — ARTIFICIAL TEARS OPHTHALMIC OINT
TOPICAL_OINTMENT | OPHTHALMIC | Status: DC | PRN
  Administered 2019-08-30: 1 via OPHTHALMIC

## 2019-08-30 MED ORDER — PANTOPRAZOLE SODIUM 40 MG PO TBEC
40.0000 mg | DELAYED_RELEASE_TABLET | Freq: Every day | ORAL | Status: DC
  Administered 2019-09-01 – 2019-09-06 (×6): 40 mg via ORAL
  Filled 2019-08-30 (×6): qty 1

## 2019-08-30 MED ORDER — BUPIVACAINE HCL (PF) 0.5 % IJ SOLN
INTRAMUSCULAR | Status: DC | PRN
  Administered 2019-08-30: 30 mL

## 2019-08-30 MED ORDER — ALBUMIN HUMAN 5 % IV SOLN: INTRAVENOUS | Status: DC | PRN

## 2019-08-30 MED ORDER — INSULIN REGULAR(HUMAN) IN NACL 100-0.9 UT/100ML-% IV SOLN
INTRAVENOUS | Status: DC | PRN
  Administered 2019-08-30: 11 [IU]/h via INTRAVENOUS

## 2019-08-30 MED ORDER — ORAL CARE MOUTH RINSE
15.0000 mL | OROMUCOSAL | Status: DC
  Administered 2019-08-30 – 2019-08-31 (×7): 15 mL via OROMUCOSAL

## 2019-08-30 MED ORDER — ACETAMINOPHEN 160 MG/5ML PO SOLN: 1000.0000 mg | Freq: Four times a day (QID) | ORAL | Status: AC

## 2019-08-30 MED ORDER — PROTAMINE SULFATE 10 MG/ML IV SOLN
INTRAVENOUS | Status: DC | PRN
  Administered 2019-08-30: 300 mg via INTRAVENOUS

## 2019-08-30 MED ORDER — METOPROLOL TARTRATE 25 MG/10 ML ORAL SUSPENSION: 12.5000 mg | Freq: Two times a day (BID) | ORAL | Status: DC

## 2019-08-30 MED ORDER — PHENYLEPHRINE 40 MCG/ML (10ML) SYRINGE FOR IV PUSH (FOR BLOOD PRESSURE SUPPORT)
PREFILLED_SYRINGE | INTRAVENOUS | Status: DC | PRN
  Administered 2019-08-30 (×2): 80 ug via INTRAVENOUS
  Administered 2019-08-30: 40 ug via INTRAVENOUS

## 2019-08-30 MED ORDER — SODIUM CHLORIDE 0.9 % IV SOLN
20.0000 ug | Freq: Once | INTRAVENOUS | Status: AC
  Administered 2019-08-31: 20 ug via INTRAVENOUS
  Filled 2019-08-30: qty 5

## 2019-08-30 MED ORDER — ASPIRIN 81 MG PO CHEW: 324.0000 mg | CHEWABLE_TABLET | Freq: Every day | ORAL | Status: DC

## 2019-08-30 MED ORDER — INSULIN REGULAR(HUMAN) IN NACL 100-0.9 UT/100ML-% IV SOLN
INTRAVENOUS | Status: DC
  Administered 2019-08-31: 02:00:00 1.5 [IU]/h via INTRAVENOUS
  Filled 2019-08-30 (×2): qty 100

## 2019-08-30 MED ORDER — MIDAZOLAM HCL (PF) 10 MG/2ML IJ SOLN
INTRAMUSCULAR | Status: AC
  Filled 2019-08-30: qty 2

## 2019-08-30 SURGICAL SUPPLY — 122 items
ADAPTER CARDIO PERF ANTE/RETRO (ADAPTER) ×5 IMPLANT
APPLIER CLIP 9.375 SM OPEN (CLIP)
BAG DECANTER FOR FLEXI CONT (MISCELLANEOUS) ×5 IMPLANT
BASKET HEART (ORDER IN 25'S) (MISCELLANEOUS) ×1
BASKET HEART (ORDER IN 25S) (MISCELLANEOUS) ×4 IMPLANT
BATTERY MAXDRIVER (MISCELLANEOUS) ×5 IMPLANT
BLADE CLIPPER SURG (BLADE) ×10 IMPLANT
BLADE STERNUM SYSTEM 6 (BLADE) ×5 IMPLANT
BLADE SURG 11 STRL SS (BLADE) ×5 IMPLANT
BLADE SURG 15 STRL LF DISP TIS (BLADE) ×4 IMPLANT
BLADE SURG 15 STRL SS (BLADE) ×1
BNDG ELASTIC 4X5.8 VLCR STR LF (GAUZE/BANDAGES/DRESSINGS) ×5 IMPLANT
BNDG ELASTIC 6X5.8 VLCR STR LF (GAUZE/BANDAGES/DRESSINGS) ×5 IMPLANT
BNDG GAUZE ELAST 4 BULKY (GAUZE/BANDAGES/DRESSINGS) ×5 IMPLANT
CANISTER SUCT 3000ML PPV (MISCELLANEOUS) ×5 IMPLANT
CANNULA NON VENT 22FR 12 (CANNULA) ×5 IMPLANT
CATH CPB KIT HENDRICKSON (MISCELLANEOUS) ×5 IMPLANT
CATH RETROPLEGIA CORONARY 14FR (CATHETERS) ×5 IMPLANT
CATH ROBINSON RED A/P 18FR (CATHETERS) ×15 IMPLANT
CLIP APPLIE 9.375 SM OPEN (CLIP) IMPLANT
CLIP FOGARTY SPRING 6M (CLIP) ×5 IMPLANT
CLIP RETRACTION 3.0MM CORONARY (MISCELLANEOUS) ×5 IMPLANT
CLIP VESOCCLUDE MED 24/CT (CLIP) IMPLANT
CLIP VESOCCLUDE SM WIDE 24/CT (CLIP) IMPLANT
CONN Y 3/8X3/8X3/8  BEN (MISCELLANEOUS) ×1
CONN Y 3/8X3/8X3/8 BEN (MISCELLANEOUS) ×4 IMPLANT
COVER MAYO STAND STRL (DRAPES) ×5 IMPLANT
DERMABOND ADVANCED (GAUZE/BANDAGES/DRESSINGS) ×1
DERMABOND ADVANCED .7 DNX12 (GAUZE/BANDAGES/DRESSINGS) ×4 IMPLANT
DRAIN CHANNEL 28F RND 3/8 FF (WOUND CARE) ×15 IMPLANT
DRAPE CARDIOVASCULAR INCISE (DRAPES) ×1
DRAPE EXTREMITY T 121X128X90 (DISPOSABLE) ×5 IMPLANT
DRAPE HALF SHEET 40X57 (DRAPES) ×5 IMPLANT
DRAPE SLUSH/WARMER DISC (DRAPES) ×5 IMPLANT
DRAPE SRG 135X102X78XABS (DRAPES) ×4 IMPLANT
DRSG AQUACEL AG ADV 3.5X14 (GAUZE/BANDAGES/DRESSINGS) ×5 IMPLANT
ELECT CAUTERY BLADE 6.4 (BLADE) ×5 IMPLANT
ELECT REM PT RETURN 9FT ADLT (ELECTROSURGICAL) ×10
ELECTRODE REM PT RTRN 9FT ADLT (ELECTROSURGICAL) ×8 IMPLANT
FELT TEFLON 1X6 (MISCELLANEOUS) ×5 IMPLANT
GAUZE SPONGE 4X4 12PLY STRL (GAUZE/BANDAGES/DRESSINGS) ×15 IMPLANT
GAUZE SPONGE 4X4 12PLY STRL LF (GAUZE/BANDAGES/DRESSINGS) ×10 IMPLANT
GEL ULTRASOUND 20GR AQUASONIC (MISCELLANEOUS) IMPLANT
GLOVE BIO SURGEON STRL SZ 6 (GLOVE) ×20 IMPLANT
GLOVE BIO SURGEON STRL SZ7 (GLOVE) ×5 IMPLANT
GLOVE BIOGEL PI IND STRL 6 (GLOVE) ×20 IMPLANT
GLOVE BIOGEL PI IND STRL 7.5 (GLOVE) ×12 IMPLANT
GLOVE BIOGEL PI INDICATOR 6 (GLOVE) ×5
GLOVE BIOGEL PI INDICATOR 7.5 (GLOVE) ×3
GLOVE NEODERM STRL 7.5 LF PF (GLOVE) ×12 IMPLANT
GLOVE SURG NEODERM 7.5  LF PF (GLOVE) ×3
GLOVE SURG SS PI 7.5 STRL IVOR (GLOVE) ×20 IMPLANT
GOWN STRL REUS W/ TWL LRG LVL3 (GOWN DISPOSABLE) ×44 IMPLANT
GOWN STRL REUS W/TWL LRG LVL3 (GOWN DISPOSABLE) ×11
HEMOSTAT POWDER SURGIFOAM 1G (HEMOSTASIS) ×10 IMPLANT
HEMOSTAT SURGICEL 2X14 (HEMOSTASIS) IMPLANT
INSERT FOGARTY XLG (MISCELLANEOUS) ×5 IMPLANT
IV ADAPTER SYR DOUBLE MALE LL (MISCELLANEOUS) ×5 IMPLANT
KIT BASIN OR (CUSTOM PROCEDURE TRAY) ×5 IMPLANT
KIT SUCTION CATH 14FR (SUCTIONS) ×5 IMPLANT
KIT TURNOVER KIT B (KITS) ×5 IMPLANT
KIT VASOVIEW HEMOPRO 2 VH 4000 (KITS) ×5 IMPLANT
LEAD PACING MYOCARDI (MISCELLANEOUS) IMPLANT
MARKER GRAFT CORONARY BYPASS (MISCELLANEOUS) ×20 IMPLANT
NEEDLE 18GX1X1/2 (RX/OR ONLY) (NEEDLE) ×5 IMPLANT
NS IRRIG 1000ML POUR BTL (IV SOLUTION) ×25 IMPLANT
PACK E OPEN HEART (SUTURE) ×5 IMPLANT
PACK OPEN HEART (CUSTOM PROCEDURE TRAY) ×5 IMPLANT
PACK SPY-PHI (KITS) ×5 IMPLANT
PAD ARMBOARD 7.5X6 YLW CONV (MISCELLANEOUS) ×10 IMPLANT
PAD ELECT DEFIB RADIOL ZOLL (MISCELLANEOUS) ×5 IMPLANT
PENCIL BUTTON HOLSTER BLD 10FT (ELECTRODE) ×5 IMPLANT
PLATE STERNAL 2.3X208 14H 2-PK (Plate) ×5 IMPLANT
POSITIONER HEAD DONUT 9IN (MISCELLANEOUS) ×5 IMPLANT
PUNCH AORTIC ROTATE  4.5MM 8IN (MISCELLANEOUS) ×5 IMPLANT
SCREW LOCKING TI 2.3X11MM (Screw) ×30 IMPLANT
SCREW LOCKING TI 2.3X13MM (Screw) ×20 IMPLANT
SCREW STERNAL 2.3X17MM (Screw) ×10 IMPLANT
SEALANT SURG COSEAL 8ML (VASCULAR PRODUCTS) ×5 IMPLANT
SET CARDIOPLEGIA MPS 5001102 (MISCELLANEOUS) ×5 IMPLANT
SHEARS HARMONIC 9CM CVD (BLADE) ×5 IMPLANT
SHEARS HARMONIC STRL 23CM (MISCELLANEOUS) IMPLANT
STAPLER VISISTAT 35W (STAPLE) ×5 IMPLANT
SUT BONE WAX W31G (SUTURE) ×5 IMPLANT
SUT MNCRL AB 3-0 PS2 18 (SUTURE) ×10 IMPLANT
SUT MNCRL AB 4-0 PS2 18 (SUTURE) ×20 IMPLANT
SUT PDS AB 1 CTX 36 (SUTURE) ×10 IMPLANT
SUT PROLENE 3 0 SH DA (SUTURE) ×5 IMPLANT
SUT PROLENE 5 0 C 1 36 (SUTURE) IMPLANT
SUT PROLENE 6 0 C 1 30 (SUTURE) ×25 IMPLANT
SUT PROLENE 6 0 CC (SUTURE) ×5 IMPLANT
SUT PROLENE 8 0 BV175 6 (SUTURE) ×10 IMPLANT
SUT PROLENE BLUE 7 0 (SUTURE) ×5 IMPLANT
SUT SILK  1 MH (SUTURE) ×3
SUT SILK 1 MH (SUTURE) ×12 IMPLANT
SUT SILK 2 0 SH CR/8 (SUTURE) IMPLANT
SUT SILK 3 0 SH CR/8 (SUTURE) IMPLANT
SUT STEEL 6MS V (SUTURE) IMPLANT
SUT STEEL SZ 6 DBL 3X14 BALL (SUTURE) ×10 IMPLANT
SUT VIC AB 2-0 CT1 27 (SUTURE) ×2
SUT VIC AB 2-0 CT1 TAPERPNT 27 (SUTURE) ×8 IMPLANT
SUT VIC AB 2-0 CTX 27 (SUTURE) IMPLANT
SUT VIC AB 3-0 SH 27 (SUTURE)
SUT VIC AB 3-0 SH 27X BRD (SUTURE) IMPLANT
SUT VIC AB 3-0 X1 27 (SUTURE) IMPLANT
SYR 10ML LL (SYRINGE) IMPLANT
SYR 30ML LL (SYRINGE) ×5 IMPLANT
SYR 3ML LL SCALE MARK (SYRINGE) ×5 IMPLANT
SYR 50ML SLIP (SYRINGE) IMPLANT
SYSTEM SAHARA CHEST DRAIN ATS (WOUND CARE) ×5 IMPLANT
TAPE CLOTH SURG 4X10 WHT LF (GAUZE/BANDAGES/DRESSINGS) ×5 IMPLANT
TAPE PAPER 2X10 WHT MICROPORE (GAUZE/BANDAGES/DRESSINGS) ×5 IMPLANT
TOWEL GREEN STERILE (TOWEL DISPOSABLE) ×5 IMPLANT
TOWEL GREEN STERILE FF (TOWEL DISPOSABLE) ×5 IMPLANT
TRAY FOLEY SLVR 16FR TEMP STAT (SET/KITS/TRAYS/PACK) ×5 IMPLANT
TUBING ART PRESS 48 MALE/FEM (TUBING) ×5 IMPLANT
TUBING ART PRESS 72  MALE/FEM (TUBING) ×1
TUBING ART PRESS 72 MALE/FEM (TUBING) ×4 IMPLANT
TUBING LAP HI FLOW INSUFFLATIO (TUBING) ×5 IMPLANT
UNDERPAD 30X30 (UNDERPADS AND DIAPERS) ×5 IMPLANT
WATER STERILE IRR 1000ML POUR (IV SOLUTION) ×10 IMPLANT
WATER STERILE IRR 1000ML UROMA (IV SOLUTION) ×5 IMPLANT

## 2019-08-30 NOTE — Anesthesia Procedure Notes (Signed)
Central Venous Catheter Insertion Performed by: Roberts Gaudy, MD, anesthesiologist Start/End2/10/2019 6:50 AM, 08/30/2019 7:00 AM Patient location: Pre-op. Preanesthetic checklist: patient identified, IV checked, site marked, risks and benefits discussed, surgical consent, monitors and equipment checked, pre-op evaluation, timeout performed and anesthesia consent Lidocaine 1% used for infiltration and patient sedated Hand hygiene performed  and maximum sterile barriers used  Catheter size: 9 Fr MAC introducer Procedure performed using ultrasound guided technique. Ultrasound Notes:anatomy identified, needle tip was noted to be adjacent to the nerve/plexus identified, no ultrasound evidence of intravascular and/or intraneural injection and image(s) printed for medical record Attempts: 1 Following insertion, line sutured and dressing applied. Post procedure assessment: blood return through all ports, free fluid flow and no air  Patient tolerated the procedure well with no immediate complications.

## 2019-08-30 NOTE — Brief Op Note (Signed)
08/26/2019 - 08/30/2019  12:38 PM  PATIENT:  Jacob Brady  59 y.o. male  PRE-OPERATIVE DIAGNOSIS:  CORONARY ARTERY DISEASE  POST-OPERATIVE DIAGNOSIS:  CORONARY ARTERY DISEASE  PROCEDURES: CORONARY ARTERY BYPASS GRAFTING x 5   LIMA to LAD RIMA to PLB of RCA  SVG to Diag1 LEFT RADIAL ARTERY to OM1 and OM2. (sequential graft)  BILATERAL IMA and LEFT RADIAL ARTERY HARVEST  ENDOSCOPIC SAPHENOUS VEIN HARVEST RIGHT THIGH INDOCYANINE GREEN FLUORESCENCE IMAGING (ICG)  TRANSESOPHAGEAL ECHOCARDIOGRAM    SURGEON:  Adron Bene, MD - Primary  PHYSICIAN ASSISTANT: Locklyn Henriquez   ANESTHESIA:   general  EBL:  Per anesthesia and perfusion records   BLOOD ADMINISTERED:none  DRAINS: Bilateral pleural and mediastinal tubes   LOCAL MEDICATIONS USED:  Exparel  SPECIMEN:  No Specimen  DISPOSITION OF SPECIMEN:  N/A  COUNTS:  YES  DICTATION: .Dragon Dictation  PLAN OF CARE: Admit to inpatient   PATIENT DISPOSITION:  ICU - intubated and hemodynamically stable.   Delay start of Pharmacological VTE agent (>24hrs) due to surgical blood loss or risk of bleeding: yes

## 2019-08-30 NOTE — Progress Notes (Signed)
CT surgery p.m. Rounds  Patient extubated with adequate post extubation ABG Heart rate 82, cardiac output 6 L and patient on phenylephrine and vasopressin for low SVR Hemoglobin 6 hours postop 9.4 Chest tube output 50 to 80 cc/h serosanguineous.  Platelet count 133.  We will transfuse 2 FFP's for chest tube output and low PAD

## 2019-08-30 NOTE — Anesthesia Procedure Notes (Signed)
Procedure Name: Intubation Date/Time: 08/30/2019 7:46 AM Performed by: Elliot Dally, CRNA Pre-anesthesia Checklist: Patient identified, Emergency Drugs available, Suction available and Patient being monitored Patient Re-evaluated:Patient Re-evaluated prior to induction Oxygen Delivery Method: Circle System Utilized Preoxygenation: Pre-oxygenation with 100% oxygen Induction Type: IV induction Ventilation: Mask ventilation without difficulty and Oral airway inserted - appropriate to patient size Laryngoscope Size: Glidescope and 4 Grade View: Grade I Tube type: Oral Tube size: 8.0 mm Number of attempts: 1 Airway Equipment and Method: Stylet,  Oral airway and Video-laryngoscopy Placement Confirmation: ETT inserted through vocal cords under direct vision,  positive ETCO2 and breath sounds checked- equal and bilateral Secured at: 24 cm Tube secured with: Tape Dental Injury: Teeth and Oropharynx as per pre-operative assessment  Comments: Elective glide scope intubation due to multiple loose teeth on the top and bottom. Patient informed of possible injury to teeth because of poor baseline dentition. Post intubation, teeth remained as per preoperative condition.

## 2019-08-30 NOTE — H&P (Signed)
History and Physical Interval Note:  08/30/2019 7:19 AM  Jacob Brady  has presented today for surgery, with the diagnosis of CAD.  The various methods of treatment have been discussed with the patient and family. After consideration of risks, benefits and other options for treatment, the patient has consented to  Procedure(s) with comments: CORONARY ARTERY BYPASS GRAFTING (CABG) (N/A) - BILATERAL IMA RADIAL ARTERY HARVEST (Bilateral) INDOCYANINE GREEN FLUORESCENCE IMAGING (ICG) (N/A) TRANSESOPHAGEAL ECHOCARDIOGRAM (TEE) (N/A) as a surgical intervention.  The patient's history has been reviewed, patient examined, no change in status, stable for surgery.  I have reviewed the patient's chart and labs.  Questions were answered to the patient's satisfaction.     Linden Dolin

## 2019-08-30 NOTE — Procedures (Signed)
Extubation Procedure Note  Patient Details:   Name: Jacob Brady DOB: [birth year] MRN: [id number]   Airway Documentation:    Vent end date: 08/30/19 Vent end time: 1740   Evaluation  O2 sats: stable throughout Complications: No apparent complications Patient did tolerate procedure well. Bilateral Breath Sounds: Clear, Diminished   Yes   NIF -30 VC  Pt extubated per heart wean protocol to 6L Minor. RN and RT at bedside, cuff leak heard prior to extubation. Pt is able to speak his name and able to follow commands.   Dewain Penning T 08/30/2019, 5:44 PM

## 2019-08-30 NOTE — Progress Notes (Signed)
  Echocardiogram Echocardiogram Transesophageal has been performed.  Delcie Roch 08/30/2019, 8:52 AM

## 2019-08-30 NOTE — Transfer of Care (Signed)
Immediate Anesthesia Transfer of Care Note  Patient: Jacob Brady  Procedure(s) Performed: CORONARY ARTERY BYPASS GRAFTING (CABG) using LIMA to LAD; RIMA to PDL; endoscopic right greater saphenous vein harvest to Diag1; and left radial harvest sequenced to OM1 and OM2. (N/A Chest) RADIAL ARTERY HARVEST (Left Arm Lower) INDOCYANINE GREEN FLUORESCENCE IMAGING (ICG) (N/A ) TRANSESOPHAGEAL ECHOCARDIOGRAM (TEE) (N/A ) Endovein Harvest Of Greater Saphenous Vein (Right Leg Upper)  Patient Location: ICU  Anesthesia Type:General  Level of Consciousness: sedated and Patient remains intubated per anesthesia plan  Airway & Oxygen Therapy: Patient remains intubated per anesthesia plan and Patient placed on Ventilator (see vital sign flow sheet for setting)  Post-op Assessment: Report given to RN and Post -op Vital signs reviewed and stable  Post vital signs: Reviewed and stable  Last Vitals:  Vitals Value Taken Time  BP    Temp    Pulse    Resp    SpO2      Last Pain:  Vitals:   08/30/19 0343  TempSrc:   PainSc: 0-No pain      Patients Stated Pain Goal: 0 (08/28/19 1800)  Complications: No apparent anesthesia complications

## 2019-08-30 NOTE — Progress Notes (Signed)
1845. Low MAPs <70 while max on NEO and increased agitation/complaints of pain despite Morphine & Oxi given. CI/CO stable. Paged Dr. Vickey Sages. Orders for Vasopressin and Toradol initiated.  Joycie Peek, RN

## 2019-08-30 NOTE — Anesthesia Procedure Notes (Signed)
Central Venous Catheter Insertion Performed by: Kipp Brood, MD, anesthesiologist Start/End2/10/2019 6:50 AM, 08/30/2019 7:00 AM Patient location: Pre-op. Preanesthetic checklist: patient identified, IV checked, site marked, risks and benefits discussed, surgical consent, monitors and equipment checked, pre-op evaluation, timeout performed and anesthesia consent Hand hygiene performed  and maximum sterile barriers used  PA cath was placed.Swan type:thermodilution Procedure performed without using ultrasound guided technique. Attempts: 1 Patient tolerated the procedure well with no immediate complications.

## 2019-08-30 NOTE — Anesthesia Procedure Notes (Signed)
Arterial Line Insertion Start/End2/10/2019 7:00 AM, 08/30/2019 7:15 AM Performed by: Elliot Dally, CRNA, CRNA  Patient location: Pre-op. Preanesthetic checklist: patient identified, IV checked, site marked, risks and benefits discussed, surgical consent, monitors and equipment checked, pre-op evaluation, timeout performed and anesthesia consent Patient sedated Right, radial was placed Catheter size: 20 G Hand hygiene performed  and maximum sterile barriers used  Allen's test indicative of satisfactory collateral circulation Attempts: 1 Procedure performed without using ultrasound guided technique. Following insertion, dressing applied and Biopatch. Post procedure assessment: normal  Patient tolerated the procedure well with no immediate complications.

## 2019-08-30 NOTE — Anesthesia Postprocedure Evaluation (Signed)
Anesthesia Post Note  Patient: Jacob Brady  Procedure(s) Performed: CORONARY ARTERY BYPASS GRAFTING (CABG) using LIMA to LAD; RIMA to PDL; endoscopic right greater saphenous vein harvest to Diag1; and left radial harvest sequenced to OM1 and OM2. (N/A Chest) RADIAL ARTERY HARVEST (Left Arm Lower) INDOCYANINE GREEN FLUORESCENCE IMAGING (ICG) (N/A ) TRANSESOPHAGEAL ECHOCARDIOGRAM (TEE) (N/A ) Endovein Harvest Of Greater Saphenous Vein (Right Leg Upper)     Patient location during evaluation: ICU Anesthesia Type: General Level of consciousness: sedated and patient remains intubated per anesthesia plan Pain management: pain level controlled Vital Signs Assessment: post-procedure vital signs reviewed and stable Respiratory status: patient remains intubated per anesthesia plan Cardiovascular status: stable Postop Assessment: no apparent nausea or vomiting Anesthetic complications: no    Last Vitals:  Vitals:   08/30/19 0315 08/30/19 1355  BP: 133/85   Pulse: 74 88  Resp:  12  Temp: 36.6 C   SpO2: 96% 92%    Last Pain:  Vitals:   08/30/19 0343  TempSrc:   PainSc: 0-No pain                 Beryle Lathe

## 2019-08-30 NOTE — Op Note (Signed)
CARDIOTHORACIC SURGERY OPERATIVE NOTE  Date of Procedure: 08/30/2019  Preoperative Diagnosis: Severe 3-vessel Coronary Artery Disease with mildly depressed LV function  Postoperative Diagnosis: Same  Procedure:    Coronary Artery Bypass Grafting x 5  Left Internal Mammary Artery to Distal Left Anterior Descending Coronary Artery; Pedicled RIMA to right Posterolateral Coronary Artery; Left radial artery Graft to 1st and 2nd Obtuse Marginal Branches of Left Circumflex Coronary Artery; Sapheonous Vein Graft to 1st  Diagonal Branch Coronary Artery; Endoscopic Vein Harvest from right Thigh; left open radial artery harvest; bilateral IMA harvesting Completion graft surveillance  with indocyanine green fluorescence imaging (SPY) Multilevel rib block with expare/marcaine solution Rigid sternal reconstruction with linear plating system  Surgeon: Threasa Heads MD  Assistant: Macarthur Critchley PA-C  Anesthesia: get  Operative Findings:  Mildly reduced left ventricular systolic function  good quality  mammary artery conduits  good quality saphenous vein and left radial artery conduits  Good quality target vessels for grafting    BRIEF CLINICAL NOTE AND INDICATIONS FOR SURGERY  59 yo man has had worsening CP for past few weeks. Ultimately admitted for intractable angina and underwent LHC showing severe 3V CAD; presents now for CABG.    DETAILS OF THE OPERATIVE PROCEDURE  Preparation:  The patient is brought to the operating room on the above mentioned date and central monitoring was established by the anesthesia team including placement of Swan-Ganz catheter and radial arterial line. The patient is placed in the supine position on the operating table.  Intravenous antibiotics are administered. General endotracheal anesthesia is induced uneventfully. A Foley catheter is placed.  Baseline transesophageal echocardiogram was performed.  Findings were notable for mildly reduced LV function and a  sclerotic aortic valve with mild regurgitation.  The patient's chest, abdomen, both groins, left upper extremity, and both lower extremities are prepared and draped in a sterile manner. A time out procedure is performed.   Surgical Approach and Conduit Harvest:  A median sternotomy incision was performed and the left internal mammary artery is dissected from the chest wall and prepared for bypass grafting. The left internal mammary artery is notably good quality conduit. Simultaneously, the left radial artery is harvested by an open technique. When completed, the left arm incision is repaired in layers and re-tucked at the left side. Next, attention is turned to the right chest where the RIMA is harvested. In addition, the greater saphenous vein is obtained from the patient's right thigh using endoscopic vein harvest technique. The saphenous vein is notably good quality conduit. After removal of the saphenous vein, the small surgical incisions in the lower extremity are closed with absorbable suture. Following systemic heparinization, the left and right internal mammary arteries were transected distally noted to have excellent flow. The were treated with papaverine solution. The interspaces on each chest wall are injected with Exparel/marcaine solution. Chest tubes are placed in the bilateral pleural spaces.    Extracorporeal Cardiopulmonary Bypass and Myocardial Protection:  The pericardium is opened. The ascending aorta is nondiseased in appearance. The ascending aorta and the right atrium are cannulated for cardiopulmonary bypass.  Adequate heparinization is verified.   A retrograde cardioplegia cannula is placed through the right atrium into the coronary sinus.  The entire pre-bypass portion of the operation was notable for stable hemodynamics.  Cardiopulmonary bypass was begun and the surface of the heart is inspected. Distal target vessels are selected for coronary artery bypass grafting. A  cardioplegia cannula is placed in the ascending aorta.    The  patient is allowed to cool passively to 34 C systemic temperature.  The aortic cross clamp is applied and cold blood cardioplegia is delivered initially in an antegrade fashion through the aortic root.   Supplemental cardioplegia is given retrograde through the coronary sinus catheter.  Iced saline slush is applied for topical hypothermia.  The initial cardioplegic arrest is rapid with early diastolic arrest.  Repeat doses of cardioplegia are administered intermittently throughout the entire cross clamp portion of the operation through the aortic root, through the coronary sinus catheter, and through subsequently placed vein grafts in order to maintain completely flat electrocardiogram.   Coronary Artery Bypass Grafting:   The 1st diagonal branch of the left anterior descending coronary artery was grafted using a reversed saphenous vein graft in an end-to-side fashion.  At the site of distal anastomosis the target vessel was good quality and measured approximately 1.5 mm in diameter.  The 1st and 2nd obtuse marginal branches of the left circumflex coronary artery were grafted using the left radial artery graft in a sequential fashion.  At the site of distal anastomoses the target vessels were good quality and measured approximately 1.5 mm in diameter. Anastomotic patency and runoff was confirmed with indocyanine green fluorescence imaging (SPY).  The distal left anterior coronary artery was grafted with the left internal mammary artery in an end-to-side fashion.  At the site of distal anastomosis the target vessel was good quality and measured approximately 2 mm in diameter. Anastomotic patency and runoff was confirmed with indocyanine green fluorescence imaging (SPY).  The right  posterolateral branch of the right coronary artery was grafted using the pedicled RIMA graft in an end-to-side fashion.  At the site of distal anastomosis the  target vessel was good quality and measured approximately 2 mm in diameter. Anastomotic patency and runoff was confirmed with indocyanine green fluorescence imaging (SPY).  All proximal vein graft anastomoses were placed directly to the ascending aorta prior to removal of the aortic cross clamp.  Deairing procedures were performed and the aortic cross-clamp was removed.   Procedure Completion:  All proximal and distal coronary anastomoses were inspected for hemostasis and appropriate graft orientation. Epicardial pacing wires are fixed to the right ventricular outflow tract and to the right atrial appendage. The patient is rewarmed to 37C temperature. The patient is weaned and disconnected from cardiopulmonary bypass.  The patient's rhythm at separation from bypass was NSR.  The patient was weaned from cardiopulmonary bypass without any inotropic support.  Followup transesophageal echocardiogram performed after separation from bypass revealed no changes from the preoperative exam.  The aortic and venous cannula were removed uneventfully. Protamine was administered to reverse the anticoagulation. The mediastinum and pleural space were inspected for hemostasis and irrigated with saline solution. The mediastinum and bilateral pleural space were drained using fluted chest tubes placed through separate stab incisions inferiorly.  The soft tissues anterior to the aorta were reapproximated loosely. The sternum is closed with double strength sternal wire and reinforced on each side of the sternum with a linear plating system to create a rigid sternal reconstruction. The soft tissues anterior to the sternum were closed in multiple layers and the skin is closed with a running subcuticular skin closure.  The post-bypass portion of the operation was notable for stable rhythm and hemodynamics. No blood products were administered during the operation.   Disposition:  The patient tolerated the procedure well and  is transported to the surgical intensive care in stable condition. There are no intraoperative complications.  All sponge instrument and needle counts are verified correct at completion of the operation.    Brantley Fling, MD 08/30/2019 2:24 PM

## 2019-08-30 NOTE — Consult Note (Signed)
[address]       [place] [address]             [phone]        [name] [facility] Medical Record #[id number] Date of Birth: 04/26/1961  Referring: No ref. provider found Primary Care: [facility] Primary Cardiologist:No primary care provider on file.  Chief Complaint:    Chief Complaint  Patient presents with  . Chest Pain  . Suicidal    History of Present Illness:      59 yo man with known DM, HL, HTN began having CP in Dec. This progressed and he was seen at [facility] where r/o for MI in mid-Jan. He planned to f/u at [facility] but his pain worsened to point of suicidal ideation. He has continued to smoke cigarettes through this. He was admitted this week with worsening pain. 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 Consult received for CABG. Has been stable on NTG and heparin gtt.   Current Activity/ Functional Status: Patient will be independent with mobility/ambulation, transfers, ADL's, IADL's.   Zubrod Score: At the time of surgery this patient's most appropriate activity status/level should be described as: []     0    Normal activity, no symptoms []     1    Restricted in physical strenuous activity but ambulatory, able to do out light work []     2    Ambulatory and capable of self care, unable to do work activities, up and about                 more than 50%  Of the time                            []     3    Only limited self care, in bed greater than 50% of waking hours []     4    Completely disabled, no self care, confined to bed or chair []     5    Moribund  Past Medical History:  Diagnosis Date  . Arthritis   . Diabetes mellitus without complication (HCC)   . Hypertension   . Pancreatitis   . PTSD (post-traumatic stress disorder)     Past Surgical History:  Procedure Laterality Date  . CHOLECYSTECTOMY    . LEFT HEART CATH AND CORONARY ANGIOGRAPHY N/A 08/27/2019   Procedure: LEFT HEART CATH AND  CORONARY ANGIOGRAPHY;  Surgeon: , MD;  [facility]: [facility];  Service: Cardiovascular;  Laterality: N/A;  . WRIST FRACTURE SURGERY Right     Social History   Tobacco Use  Smoking Status Current Every Day Smoker  . Packs/day: 1.50  . Years: 35.00  . Pack years: 52.50  Smokeless Tobacco Never Used    Social History   Substance and Sexual Activity  Alcohol Use No     Allergies  Allergen Reactions  . Hctz [Hydrochlorothiazide] Other (See Comments)    Unknown rxn, "they just told me not to take it anymore"  . Seroquel [Quetiapine Fumarate] Other (See Comments)    Elevates liver enzymes     Current Facility-Administered Medications  Medication Dose Route Frequency Provider Last Rate Last Admin  . [MAR Hold] 0.9 %  sodium chloride infusion  250 mL Intravenous PRN , MD 10 mL/hr at 08/29/19 0909 250 mL at 08/29/19 0909  . [MAR Hold] acetaminophen (  [address]       [place] [address]             [phone]        [name] [facility] Medical Record #[id number] Date of Birth: 04/26/1961  Referring: No ref. provider found [facility] Care: [facility] Primary Cardiologist:No primary care provider on file.  Chief Complaint:    Chief Complaint  Patient presents with  . Chest Pain  . Suicidal    History of Present Illness:      59 yo man with known DM, HL, HTN began having CP in Dec. This progressed and he was seen at [facility] where r/o for MI in mid-Jan. He planned to f/u at [facility] but his pain worsened to point of suicidal ideation. He has continued to smoke cigarettes through this. He was admitted this week with worsening pain. 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 Consult received for CABG. Has been stable on NTG and heparin gtt.   Current Activity/ Functional Status: Patient will be independent with mobility/ambulation, transfers, ADL's, IADL's.   Zubrod Score: At the time of surgery this patient's most appropriate activity status/level should be described as: []     0    Normal activity, no symptoms []     1    Restricted in physical strenuous activity but ambulatory, able to do out light work []     2    Ambulatory and capable of self care, unable to do work activities, up and about                 more than 50%  Of the time                            []     3    Only limited self care, in bed greater than 50% of waking hours []     4    Completely disabled, no self care, confined to bed or chair []     5    Moribund  Past Medical History:  Diagnosis Date  . Arthritis   . Diabetes mellitus without complication (HCC)   . Hypertension   . Pancreatitis   . PTSD (post-traumatic stress disorder)     Past Surgical History:  Procedure Laterality Date  . CHOLECYSTECTOMY    . LEFT HEART CATH AND CORONARY ANGIOGRAPHY N/A 08/27/2019   Procedure: LEFT HEART CATH AND  CORONARY ANGIOGRAPHY;  Surgeon: , MD;  Location: [facility];  Service: Cardiovascular;  Laterality: N/A;  . WRIST FRACTURE SURGERY Right     Social History   Tobacco Use  Smoking Status Current Every Day Smoker  . Packs/day: 1.50  . Years: 35.00  . Pack years: 52.50  Smokeless Tobacco Never Used    Social History   Substance and Sexual Activity  Alcohol Use No     Allergies  Allergen Reactions  . Hctz [Hydrochlorothiazide] Other (See Comments)    Unknown rxn, "they just told me not to take it anymore"  . Seroquel [Quetiapine Fumarate] Other (See Comments)    Elevates liver enzymes     Current Facility-Administered Medications  Medication Dose Route Frequency Provider Last Rate Last Admin  . [MAR Hold] 0.9 %  sodium chloride infusion  250 mL Intravenous PRN , MD 10 mL/hr at 08/29/19 0909 250 mL at 08/29/19 0909  . [MAR Hold] acetaminophen (  [address]       [place] [address]             [phone]        [name] [facility] Medical Record #[id number] Date of Birth: 04/26/1961  Referring: No ref. provider found [facility] Care: [facility] Primary Cardiologist:No primary care provider on file.  Chief Complaint:    Chief Complaint  Patient presents with  . Chest Pain  . Suicidal    History of Present Illness:      59 yo man with known DM, HL, HTN began having CP in Dec. This progressed and he was seen at [facility] where r/o for MI in mid-Jan. He planned to f/u at [facility] but his pain worsened to point of suicidal ideation. He has continued to smoke cigarettes through this. He was admitted this week with worsening pain. 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 Consult received for CABG. Has been stable on NTG and heparin gtt.   Current Activity/ Functional Status: Patient will be independent with mobility/ambulation, transfers, ADL's, IADL's.   Zubrod Score: At the time of surgery this patient's most appropriate activity status/level should be described as: []     0    Normal activity, no symptoms []     1    Restricted in physical strenuous activity but ambulatory, able to do out light work []     2    Ambulatory and capable of self care, unable to do work activities, up and about                 more than 50%  Of the time                            []     3    Only limited self care, in bed greater than 50% of waking hours []     4    Completely disabled, no self care, confined to bed or chair []     5    Moribund  Past Medical History:  Diagnosis Date  . Arthritis   . Diabetes mellitus without complication (HCC)   . Hypertension   . Pancreatitis   . PTSD (post-traumatic stress disorder)     Past Surgical History:  Procedure Laterality Date  . CHOLECYSTECTOMY    . LEFT HEART CATH AND CORONARY ANGIOGRAPHY N/A 08/27/2019   Procedure: LEFT HEART CATH AND  CORONARY ANGIOGRAPHY;  Surgeon: , MD;  [facility]: [facility];  Service: Cardiovascular;  Laterality: N/A;  . WRIST FRACTURE SURGERY Right     Social History   Tobacco Use  Smoking Status Current Every Day Smoker  . Packs/day: 1.50  . Years: 35.00  . Pack years: 52.50  Smokeless Tobacco Never Used    Social History   Substance and Sexual Activity  Alcohol Use No     Allergies  Allergen Reactions  . Hctz [Hydrochlorothiazide] Other (See Comments)    Unknown rxn, "they just told me not to take it anymore"  . Seroquel [Quetiapine Fumarate] Other (See Comments)    Elevates liver enzymes     Current Facility-Administered Medications  Medication Dose Route Frequency Provider Last Rate Last Admin  . [MAR Hold] 0.9 %  sodium chloride infusion  250 mL Intravenous PRN , MD 10 mL/hr at 08/29/19 0909 250 mL at 08/29/19 0909  . [MAR Hold] acetaminophen (  history of     obstruction [  ]; urinary frequency [  ]             Skin: rash, swelling[  ];, hair loss[  ];  peripheral edema[  ];  or itching[  ]; Musculosketetal: myalgias[  ];  joint swelling[  ];  joint erythema[  ];  joint pain[  ];  back pain[  ];  Heme/Lymph: bruising[  ];  bleeding[  ];  anemia[  ];  Neuro: TIA[  ];  headaches[  ];  stroke[  ];  vertigo[  ];  seizures[  ];   paresthesias[  ];  difficulty walking[  ];  Psych:depression[x  ]; anxiety[ x ];  Endocrine: diabetes[  ];  thyroid dysfunction[  ];       Physical Exam: BP 133/85 (BP Location: Right Arm)   Pulse 74   Temp 97.8 F (36.6 C)  (Oral)   Resp 15   Ht 5\' 10"  (1.778 m)   Wt 108 kg Comment: b  SpO2 96%   BMI 34.16 kg/m    General appearance: alert and cooperative Head: Normocephalic, without obvious abnormality, atraumatic Neck: no adenopathy, no carotid bruit, no JVD, supple, symmetrical, trachea midline and thyroid not enlarged, symmetric, no tenderness/mass/nodules Resp: clear to auscultation bilaterally Cardio: regular rate and rhythm, S1, S2 normal, no murmur, click, rub or gallop GI: soft, non-tender; bowel sounds normal; no masses,  no organomegaly Extremities: extremities normal, atraumatic, no cyanosis or edema Neurologic: Alert and oriented X 3, normal strength and tone. Normal symmetric reflexes. Normal coordination and gait  Diagnostic Studies & Laboratory data:     Recent Radiology Findings:   CT CHEST WO CONTRAST  Result Date: 08/28/2019 CLINICAL DATA:  Preoperative evaluation for bypass surgery, aortic disease EXAM: CT CHEST WITHOUT CONTRAST TECHNIQUE: Multidetector CT imaging of the chest was performed following the standard protocol without IV contrast. COMPARISON:  08/26/2019 FINDINGS: Cardiovascular: There is diffuse atherosclerosis throughout the coronary vasculature. Minimal calcification of the aortic valve. Mild calcified atheromatous plaque involving the distal aortic arch and descending thoracic aorta. No pericardial effusion. Mediastinum/Nodes: No enlarged mediastinal or axillary lymph nodes. Thyroid gland, trachea, and esophagus demonstrate no significant findings. Lungs/Pleura: Mild upper lobe predominant emphysema. No superimposed airspace disease, effusion, or pneumothorax. Central airways are widely patent. Upper Abdomen: No acute abnormality. Musculoskeletal: No chest wall mass or suspicious bone lesions identified. Reconstructed images demonstrate no additional findings. IMPRESSION: 1. Mild calcified atheromatous plaque of the distal aortic arch and descending thoracic aorta. 2. Minimal  calcification of the aortic valve. 3. Diffuse coronary artery atherosclerosis. 4. Mild emphysema.  No acute airspace disease. Aortic Atherosclerosis (ICD10-I70.0) and Emphysema (ICD10-J43.9). Electronically Signed   By: Randa Ngo M.D.   On: 08/28/2019 12:33   ECHOCARDIOGRAM COMPLETE  Result Date: 08/28/2019   ECHOCARDIOGRAM REPORT   Patient Name:   Jacob Brady Date of Exam: 08/28/2019 Medical Rec #:  [id number]           Height:       70.0 in Accession #:    [id number]          Weight:       249.1 lb Date of Birth:  [birth year]           BSA:          2.29 m Patient Age:    56 years            BP:  [address]       [place] [address]             [phone]        [name] [facility] Medical Record #[id number] Date of Birth: 04/26/1961  Referring: No ref. provider found [facility]: [facility] Primary Cardiologist:No primary care provider on file.  Chief Complaint:    Chief Complaint  Patient presents with  . Chest Pain  . Suicidal    History of Present Illness:      59 yo man with known DM, HL, HTN began having CP in Dec. This progressed and he was seen at [facility] where r/o for MI in mid-Jan. He planned to f/u at [facility] but his pain worsened to point of suicidal ideation. He has continued to smoke cigarettes through this. He was admitted this week with worsening pain. 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 Consult received for CABG. Has been stable on NTG and heparin gtt.   Current Activity/ Functional Status: Patient will be independent with mobility/ambulation, transfers, ADL's, IADL's.   Zubrod Score: At the time of surgery this patient's most appropriate activity status/level should be described as: []     0    Normal activity, no symptoms []     1    Restricted in physical strenuous activity but ambulatory, able to do out light work []     2    Ambulatory and capable of self care, unable to do work activities, up and about                 more than 50%  Of the time                            []     3    Only limited self care, in bed greater than 50% of waking hours []     4    Completely disabled, no self care, confined to bed or chair []     5    Moribund  Past Medical History:  Diagnosis Date  . Arthritis   . Diabetes mellitus without complication (HCC)   . Hypertension   . Pancreatitis   . PTSD (post-traumatic stress disorder)     Past Surgical History:  Procedure Laterality Date  . CHOLECYSTECTOMY    . LEFT HEART CATH AND CORONARY ANGIOGRAPHY N/A 08/27/2019   Procedure: LEFT HEART CATH AND  CORONARY ANGIOGRAPHY;  Surgeon: , MD;  [facility]: [facility];  Service: Cardiovascular;  Laterality: N/A;  . WRIST FRACTURE SURGERY Right     Social History   Tobacco Use  Smoking Status Current Every Day Smoker  . Packs/day: 1.50  . Years: 35.00  . Pack years: 52.50  Smokeless Tobacco Never Used    Social History   Substance and Sexual Activity  Alcohol Use No     Allergies  Allergen Reactions  . Hctz [Hydrochlorothiazide] Other (See Comments)    Unknown rxn, "they just told me not to take it anymore"  . Seroquel [Quetiapine Fumarate] Other (See Comments)    Elevates liver enzymes     Current Facility-Administered Medications  Medication Dose Route Frequency Provider Last Rate Last Admin  . [MAR Hold] 0.9 %  sodium chloride infusion  250 mL Intravenous PRN , MD 10 mL/hr at 08/29/19 0909 250 mL at 08/29/19 0909  . [MAR Hold] acetaminophen (  history of     obstruction [  ]; urinary frequency [  ]             Skin: rash, swelling[  ];, hair loss[  ];  peripheral edema[  ];  or itching[  ]; Musculosketetal: myalgias[  ];  joint swelling[  ];  joint erythema[  ];  joint pain[  ];  back pain[  ];  Heme/Lymph: bruising[  ];  bleeding[  ];  anemia[  ];  Neuro: TIA[  ];  headaches[  ];  stroke[  ];  vertigo[  ];  seizures[  ];   paresthesias[  ];  difficulty walking[  ];  Psych:depression[x  ]; anxiety[ x ];  Endocrine: diabetes[  ];  thyroid dysfunction[  ];       Physical Exam: BP 133/85 (BP Location: Right Arm)   Pulse 74   Temp 97.8 F (36.6 C)  (Oral)   Resp 15   Ht 5\' 10"  (1.778 m)   Wt 108 kg Comment: b  SpO2 96%   BMI 34.16 kg/m    General appearance: alert and cooperative Head: Normocephalic, without obvious abnormality, atraumatic Neck: no adenopathy, no carotid bruit, no JVD, supple, symmetrical, trachea midline and thyroid not enlarged, symmetric, no tenderness/mass/nodules Resp: clear to auscultation bilaterally Cardio: regular rate and rhythm, S1, S2 normal, no murmur, click, rub or gallop GI: soft, non-tender; bowel sounds normal; no masses,  no organomegaly Extremities: extremities normal, atraumatic, no cyanosis or edema Neurologic: Alert and oriented X 3, normal strength and tone. Normal symmetric reflexes. Normal coordination and gait  Diagnostic Studies & Laboratory data:     Recent Radiology Findings:   CT CHEST WO CONTRAST  Result Date: 08/28/2019 CLINICAL DATA:  Preoperative evaluation for bypass surgery, aortic disease EXAM: CT CHEST WITHOUT CONTRAST TECHNIQUE: Multidetector CT imaging of the chest was performed following the standard protocol without IV contrast. COMPARISON:  08/26/2019 FINDINGS: Cardiovascular: There is diffuse atherosclerosis throughout the coronary vasculature. Minimal calcification of the aortic valve. Mild calcified atheromatous plaque involving the distal aortic arch and descending thoracic aorta. No pericardial effusion. Mediastinum/Nodes: No enlarged mediastinal or axillary lymph nodes. Thyroid gland, trachea, and esophagus demonstrate no significant findings. Lungs/Pleura: Mild upper lobe predominant emphysema. No superimposed airspace disease, effusion, or pneumothorax. Central airways are widely patent. Upper Abdomen: No acute abnormality. Musculoskeletal: No chest wall mass or suspicious bone lesions identified. Reconstructed images demonstrate no additional findings. IMPRESSION: 1. Mild calcified atheromatous plaque of the distal aortic arch and descending thoracic aorta. 2. Minimal  calcification of the aortic valve. 3. Diffuse coronary artery atherosclerosis. 4. Mild emphysema.  No acute airspace disease. Aortic Atherosclerosis (ICD10-I70.0) and Emphysema (ICD10-J43.9). Electronically Signed   By: Randa Ngo M.D.   On: 08/28/2019 12:33   ECHOCARDIOGRAM COMPLETE  Result Date: 08/28/2019   ECHOCARDIOGRAM REPORT   Patient Name:   Jacob Brady Date of Exam: 08/28/2019 Medical Rec #:  [id number]           Height:       70.0 in Accession #:    [id number]          Weight:       249.1 lb Date of Birth:  [birth year]           BSA:          2.29 m Patient Age:    56 years            BP:  [address]       [place] [address]             [phone]        [name] [facility] Medical Record #[id number] Date of Birth: 04/26/1961  Referring: No ref. provider found [facility]: [facility] Primary Cardiologist:No primary care provider on file.  Chief Complaint:    Chief Complaint  Patient presents with  . Chest Pain  . Suicidal    History of Present Illness:      59 yo man with known DM, HL, HTN began having CP in Dec. This progressed and he was seen at [facility] where r/o for MI in mid-Jan. He planned to f/u at [facility] but his pain worsened to point of suicidal ideation. He has continued to smoke cigarettes through this. He was admitted this week with worsening pain. 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 Consult received for CABG. Has been stable on NTG and heparin gtt.   Current Activity/ Functional Status: Patient will be independent with mobility/ambulation, transfers, ADL's, IADL's.   Zubrod Score: At the time of surgery this patient's most appropriate activity status/level should be described as: []     0    Normal activity, no symptoms []     1    Restricted in physical strenuous activity but ambulatory, able to do out light work []     2    Ambulatory and capable of self care, unable to do work activities, up and about                 more than 50%  Of the time                            []     3    Only limited self care, in bed greater than 50% of waking hours []     4    Completely disabled, no self care, confined to bed or chair []     5    Moribund  Past Medical History:  Diagnosis Date  . Arthritis   . Diabetes mellitus without complication (HCC)   . Hypertension   . Pancreatitis   . PTSD (post-traumatic stress disorder)     Past Surgical History:  Procedure Laterality Date  . CHOLECYSTECTOMY    . LEFT HEART CATH AND CORONARY ANGIOGRAPHY N/A 08/27/2019   Procedure: LEFT HEART CATH AND  CORONARY ANGIOGRAPHY;  Surgeon: , MD;  [facility]: [facility];  Service: Cardiovascular;  Laterality: N/A;  . WRIST FRACTURE SURGERY Right     Social History   Tobacco Use  Smoking Status Current Every Day Smoker  . Packs/day: 1.50  . Years: 35.00  . Pack years: 52.50  Smokeless Tobacco Never Used    Social History   Substance and Sexual Activity  Alcohol Use No     Allergies  Allergen Reactions  . Hctz [Hydrochlorothiazide] Other (See Comments)    Unknown rxn, "they just told me not to take it anymore"  . Seroquel [Quetiapine Fumarate] Other (See Comments)    Elevates liver enzymes     Current Facility-Administered Medications  Medication Dose Route Frequency Provider Last Rate Last Admin  . [MAR Hold] 0.9 %  sodium chloride infusion  250 mL Intravenous PRN , MD 10 mL/hr at 08/29/19 0909 250 mL at 08/29/19 0909  . [MAR Hold] acetaminophen (  [address]       [place] [address]             [phone]        [name] [facility] Medical Record #[id number] Date of Birth: 04/26/1961  Referring: No ref. provider found [facility]: [facility] Primary Cardiologist:No primary care provider on file.  Chief Complaint:    Chief Complaint  Patient presents with  . Chest Pain  . Suicidal    History of Present Illness:      59 yo man with known DM, HL, HTN began having CP in Dec. This progressed and he was seen at [facility] where r/o for MI in mid-Jan. He planned to f/u at [facility] but his pain worsened to point of suicidal ideation. He has continued to smoke cigarettes through this. He was admitted this week with worsening pain. 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 Consult received for CABG. Has been stable on NTG and heparin gtt.   Current Activity/ Functional Status: Patient will be independent with mobility/ambulation, transfers, ADL's, IADL's.   Zubrod Score: At the time of surgery this patient's most appropriate activity status/level should be described as: []     0    Normal activity, no symptoms []     1    Restricted in physical strenuous activity but ambulatory, able to do out light work []     2    Ambulatory and capable of self care, unable to do work activities, up and about                 more than 50%  Of the time                            []     3    Only limited self care, in bed greater than 50% of waking hours []     4    Completely disabled, no self care, confined to bed or chair []     5    Moribund  Past Medical History:  Diagnosis Date  . Arthritis   . Diabetes mellitus without complication (HCC)   . Hypertension   . Pancreatitis   . PTSD (post-traumatic stress disorder)     Past Surgical History:  Procedure Laterality Date  . CHOLECYSTECTOMY    . LEFT HEART CATH AND CORONARY ANGIOGRAPHY N/A 08/27/2019   Procedure: LEFT HEART CATH AND  CORONARY ANGIOGRAPHY;  Surgeon: , MD;  [facility]: [facility];  Service: Cardiovascular;  Laterality: N/A;  . WRIST FRACTURE SURGERY Right     Social History   Tobacco Use  Smoking Status Current Every Day Smoker  . Packs/day: 1.50  . Years: 35.00  . Pack years: 52.50  Smokeless Tobacco Never Used    Social History   Substance and Sexual Activity  Alcohol Use No     Allergies  Allergen Reactions  . Hctz [Hydrochlorothiazide] Other (See Comments)    Unknown rxn, "they just told me not to take it anymore"  . Seroquel [Quetiapine Fumarate] Other (See Comments)    Elevates liver enzymes     Current Facility-Administered Medications  Medication Dose Route Frequency Provider Last Rate Last Admin  . [MAR Hold] 0.9 %  sodium chloride infusion  250 mL Intravenous PRN , MD 10 mL/hr at 08/29/19 0909 250 mL at 08/29/19 0909  . [MAR Hold] acetaminophen (  history of     obstruction [  ]; urinary frequency [  ]             Skin: rash, swelling[  ];, hair loss[  ];  peripheral edema[  ];  or itching[  ]; Musculosketetal: myalgias[  ];  joint swelling[  ];  joint erythema[  ];  joint pain[  ];  back pain[  ];  Heme/Lymph: bruising[  ];  bleeding[  ];  anemia[  ];  Neuro: TIA[  ];  headaches[  ];  stroke[  ];  vertigo[  ];  seizures[  ];   paresthesias[  ];  difficulty walking[  ];  Psych:depression[x  ]; anxiety[ x ];  Endocrine: diabetes[  ];  thyroid dysfunction[  ];       Physical Exam: BP 133/85 (BP Location: Right Arm)   Pulse 74   Temp 97.8 F (36.6 C)  (Oral)   Resp 15   Ht 5\' 10"  (1.778 m)   Wt 108 kg Comment: b  SpO2 96%   BMI 34.16 kg/m    General appearance: alert and cooperative Head: Normocephalic, without obvious abnormality, atraumatic Neck: no adenopathy, no carotid bruit, no JVD, supple, symmetrical, trachea midline and thyroid not enlarged, symmetric, no tenderness/mass/nodules Resp: clear to auscultation bilaterally Cardio: regular rate and rhythm, S1, S2 normal, no murmur, click, rub or gallop GI: soft, non-tender; bowel sounds normal; no masses,  no organomegaly Extremities: extremities normal, atraumatic, no cyanosis or edema Neurologic: Alert and oriented X 3, normal strength and tone. Normal symmetric reflexes. Normal coordination and gait  Diagnostic Studies & Laboratory data:     Recent Radiology Findings:   CT CHEST WO CONTRAST  Result Date: 08/28/2019 CLINICAL DATA:  Preoperative evaluation for bypass surgery, aortic disease EXAM: CT CHEST WITHOUT CONTRAST TECHNIQUE: Multidetector CT imaging of the chest was performed following the standard protocol without IV contrast. COMPARISON:  08/26/2019 FINDINGS: Cardiovascular: There is diffuse atherosclerosis throughout the coronary vasculature. Minimal calcification of the aortic valve. Mild calcified atheromatous plaque involving the distal aortic arch and descending thoracic aorta. No pericardial effusion. Mediastinum/Nodes: No enlarged mediastinal or axillary lymph nodes. Thyroid gland, trachea, and esophagus demonstrate no significant findings. Lungs/Pleura: Mild upper lobe predominant emphysema. No superimposed airspace disease, effusion, or pneumothorax. Central airways are widely patent. Upper Abdomen: No acute abnormality. Musculoskeletal: No chest wall mass or suspicious bone lesions identified. Reconstructed images demonstrate no additional findings. IMPRESSION: 1. Mild calcified atheromatous plaque of the distal aortic arch and descending thoracic aorta. 2. Minimal  calcification of the aortic valve. 3. Diffuse coronary artery atherosclerosis. 4. Mild emphysema.  No acute airspace disease. Aortic Atherosclerosis (ICD10-I70.0) and Emphysema (ICD10-J43.9). Electronically Signed   By: Randa Ngo M.D.   On: 08/28/2019 12:33   ECHOCARDIOGRAM COMPLETE  Result Date: 08/28/2019   ECHOCARDIOGRAM REPORT   Patient Name:   Jacob Brady Date of Exam: 08/28/2019 Medical Rec #:  [id number]           Height:       70.0 in Accession #:    [id number]          Weight:       249.1 lb Date of Birth:  [birth year]           BSA:          2.29 m Patient Age:    56 years            BP:  [address]       [place] [address]             [phone]        [name] [facility] Medical Record #[id number] Date of Birth: 04/26/1961  Referring: No ref. provider found Primary Care: [facility] Primary Cardiologist:No primary care provider on file.  Chief Complaint:    Chief Complaint  Patient presents with  . Chest Pain  . Suicidal    History of Present Illness:      59 yo man with known DM, HL, HTN began having CP in Dec. This progressed and he was seen at [facility] where r/o for MI in mid-Jan. He planned to f/u at [facility] but his pain worsened to point of suicidal ideation. He has continued to smoke cigarettes through this. He was admitted this week with worsening pain. 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 Consult received for CABG. Has been stable on NTG and heparin gtt.   Current Activity/ Functional Status: Patient will be independent with mobility/ambulation, transfers, ADL's, IADL's.   Zubrod Score: At the time of surgery this patient's most appropriate activity status/level should be described as: []     0    Normal activity, no symptoms []     1    Restricted in physical strenuous activity but ambulatory, able to do out light work []     2    Ambulatory and capable of self care, unable to do work activities, up and about                 more than 50%  Of the time                            []     3    Only limited self care, in bed greater than 50% of waking hours []     4    Completely disabled, no self care, confined to bed or chair []     5    Moribund  Past Medical History:  Diagnosis Date  . Arthritis   . Diabetes mellitus without complication (HCC)   . Hypertension   . Pancreatitis   . PTSD (post-traumatic stress disorder)     Past Surgical History:  Procedure Laterality Date  . CHOLECYSTECTOMY    . LEFT HEART CATH AND CORONARY ANGIOGRAPHY N/A 08/27/2019   Procedure: LEFT HEART CATH AND  CORONARY ANGIOGRAPHY;  Surgeon: , MD;  Location: [facility] INVASIVE CV LAB;  Service: Cardiovascular;  Laterality: N/A;  . WRIST FRACTURE SURGERY Right     Social History   Tobacco Use  Smoking Status Current Every Day Smoker  . Packs/day: 1.50  . Years: 35.00  . Pack years: 52.50  Smokeless Tobacco Never Used    Social History   Substance and Sexual Activity  Alcohol Use No     Allergies  Allergen Reactions  . Hctz [Hydrochlorothiazide] Other (See Comments)    Unknown rxn, "they just told me not to take it anymore"  . Seroquel [Quetiapine Fumarate] Other (See Comments)    Elevates liver enzymes     Current Facility-Administered Medications  Medication Dose Route Frequency Provider Last Rate Last Admin  . [MAR Hold] 0.9 %  sodium chloride infusion  250 mL Intravenous PRN , MD 10 mL/hr at 08/29/19 0909 250 mL at 08/29/19 0909  . [MAR Hold] acetaminophen (

## 2019-08-31 ENCOUNTER — Encounter: Payer: Self-pay | Admitting: *Deleted

## 2019-08-31 ENCOUNTER — Inpatient Hospital Stay (HOSPITAL_COMMUNITY): Payer: No Typology Code available for payment source

## 2019-08-31 LAB — CBC
HCT: 36.7 % — ABNORMAL LOW (ref 39.0–52.0)
HCT: 23.7 % — ABNORMAL LOW (ref 39.0–52.0)
HCT: 23.7 % — ABNORMAL LOW (ref 39.0–52.0)
Hemoglobin: 11.8 g/dL — ABNORMAL LOW (ref 13.0–17.0)
Hemoglobin: 7.7 g/dL — ABNORMAL LOW (ref 13.0–17.0)
Hemoglobin: 7.8 g/dL — ABNORMAL LOW (ref 13.0–17.0)
MCH: 30.6 pg (ref 26.0–34.0)
MCH: 31.4 pg (ref 26.0–34.0)
MCH: 30.5 pg (ref 26.0–34.0)
MCHC: 32.2 g/dL (ref 30.0–36.0)
MCHC: 32.5 g/dL (ref 30.0–36.0)
MCHC: 32.9 g/dL (ref 30.0–36.0)
MCV: 95.3 fL (ref 80.0–100.0)
MCV: 96.7 fL (ref 80.0–100.0)
MCV: 92.6 fL (ref 80.0–100.0)
Platelets: 138 10*3/uL — ABNORMAL LOW (ref 150–400)
Platelets: 99 10*3/uL — ABNORMAL LOW (ref 150–400)
Platelets: 90 10*3/uL — ABNORMAL LOW (ref 150–400)
RBC: 3.85 MIL/uL — ABNORMAL LOW (ref 4.22–5.81)
RBC: 2.45 MIL/uL — ABNORMAL LOW (ref 4.22–5.81)
RBC: 2.56 MIL/uL — ABNORMAL LOW (ref 4.22–5.81)
RDW: 14 % (ref 11.5–15.5)
RDW: 14.1 % (ref 11.5–15.5)
RDW: 16.4 % — ABNORMAL HIGH (ref 11.5–15.5)
WBC: 18.6 10*3/uL — ABNORMAL HIGH (ref 4.0–10.5)
WBC: 13.2 10*3/uL — ABNORMAL HIGH (ref 4.0–10.5)
WBC: 12.7 10*3/uL — ABNORMAL HIGH (ref 4.0–10.5)
nRBC: 0 % (ref 0.0–0.2)
nRBC: 0 % (ref 0.0–0.2)
nRBC: 0 % (ref 0.0–0.2)

## 2019-08-31 LAB — PREPARE FRESH FROZEN PLASMA: Unit division: 0

## 2019-08-31 LAB — BASIC METABOLIC PANEL WITH GFR
Anion gap: 9 (ref 5–15)
BUN: 9 mg/dL (ref 6–20)
CO2: 21 mmol/L — ABNORMAL LOW (ref 22–32)
Calcium: 8.3 mg/dL — ABNORMAL LOW (ref 8.9–10.3)
Chloride: 104 mmol/L (ref 98–111)
Creatinine, Ser: 0.73 mg/dL (ref 0.61–1.24)
GFR calc Af Amer: >60 mL/min
GFR calc non Af Amer: >60 mL/min
Glucose, Bld: 117 mg/dL — ABNORMAL HIGH (ref 70–99)
Potassium: 3.7 mmol/L (ref 3.5–5.1)
Sodium: 134 mmol/L — ABNORMAL LOW (ref 135–145)

## 2019-08-31 LAB — POCT I-STAT 7, (LYTES, BLD GAS, ICA,H+H)
Acid-base deficit: 3 mmol/L — ABNORMAL HIGH (ref 0.0–2.0)
Bicarbonate: 21.2 mmol/L (ref 20.0–28.0)
Calcium, Ion: 1.21 mmol/L (ref 1.15–1.40)
HCT: 20 % — ABNORMAL LOW (ref 39.0–52.0)
Hemoglobin: 6.8 g/dL — CL (ref 13.0–17.0)
O2 Saturation: 92 %
Patient temperature: 38.1
Potassium: 4.1 mmol/L (ref 3.5–5.1)
Sodium: 140 mmol/L (ref 135–145)
TCO2: 22 mmol/L (ref 22–32)
pCO2 arterial: 33.3 mmHg (ref 32.0–48.0)
pH, Arterial: 7.417 (ref 7.350–7.450)
pO2, Arterial: 66 mmHg — ABNORMAL LOW (ref 83.0–108.0)

## 2019-08-31 LAB — GLUCOSE, CAPILLARY
Glucose-Capillary: 101 mg/dL — ABNORMAL HIGH (ref 70–99)
Glucose-Capillary: 110 mg/dL — ABNORMAL HIGH (ref 70–99)
Glucose-Capillary: 110 mg/dL — ABNORMAL HIGH (ref 70–99)
Glucose-Capillary: 111 mg/dL — ABNORMAL HIGH (ref 70–99)
Glucose-Capillary: 113 mg/dL — ABNORMAL HIGH (ref 70–99)
Glucose-Capillary: 114 mg/dL — ABNORMAL HIGH (ref 70–99)
Glucose-Capillary: 118 mg/dL — ABNORMAL HIGH (ref 70–99)
Glucose-Capillary: 124 mg/dL — ABNORMAL HIGH (ref 70–99)
Glucose-Capillary: 126 mg/dL — ABNORMAL HIGH (ref 70–99)
Glucose-Capillary: 129 mg/dL — ABNORMAL HIGH (ref 70–99)
Glucose-Capillary: 130 mg/dL — ABNORMAL HIGH (ref 70–99)
Glucose-Capillary: 133 mg/dL — ABNORMAL HIGH (ref 70–99)
Glucose-Capillary: 133 mg/dL — ABNORMAL HIGH (ref 70–99)
Glucose-Capillary: 144 mg/dL — ABNORMAL HIGH (ref 70–99)
Glucose-Capillary: 151 mg/dL — ABNORMAL HIGH (ref 70–99)
Glucose-Capillary: 157 mg/dL — ABNORMAL HIGH (ref 70–99)
Glucose-Capillary: 159 mg/dL — ABNORMAL HIGH (ref 70–99)
Glucose-Capillary: 185 mg/dL — ABNORMAL HIGH (ref 70–99)
Glucose-Capillary: 83 mg/dL (ref 70–99)

## 2019-08-31 LAB — BPAM FFP
Blood Product Expiration Date: 202102092359
Blood Product Expiration Date: 202102092359
ISSUE DATE / TIME: 202102042355
ISSUE DATE / TIME: 202102042355
Unit Type and Rh: 6200
Unit Type and Rh: 6200

## 2019-08-31 LAB — BASIC METABOLIC PANEL
Anion gap: 8 (ref 5–15)
BUN: 8 mg/dL (ref 6–20)
CO2: 21 mmol/L — ABNORMAL LOW (ref 22–32)
Calcium: 8.5 mg/dL — ABNORMAL LOW (ref 8.9–10.3)
Chloride: 111 mmol/L (ref 98–111)
Creatinine, Ser: 0.74 mg/dL (ref 0.61–1.24)
GFR calc Af Amer: >60 mL/min (ref >60)
GFR calc non Af Amer: >60 mL/min (ref >60)
Glucose, Bld: 136 mg/dL — ABNORMAL HIGH (ref 70–99)
Potassium: 4.4 mmol/L (ref 3.5–5.1)
Sodium: 140 mmol/L (ref 135–145)

## 2019-08-31 LAB — MAGNESIUM
Magnesium: 2 mg/dL (ref 1.7–2.4)
Magnesium: 2 mg/dL (ref 1.7–2.4)

## 2019-08-31 LAB — PREPARE RBC (CROSSMATCH)

## 2019-08-31 MED ORDER — MIRTAZAPINE 7.5 MG PO TABS
30.0000 mg | ORAL_TABLET | Freq: Every day | ORAL | Status: DC
  Administered 2019-08-31 – 2019-09-05 (×6): 30 mg via ORAL
  Filled 2019-08-31 (×3): qty 4
  Filled 2019-08-31 (×3): qty 2

## 2019-08-31 MED ORDER — ISOSORBIDE DINITRATE 10 MG PO TABS
5.0000 mg | ORAL_TABLET | Freq: Three times a day (TID) | ORAL | Status: DC
  Administered 2019-08-31 (×3): 5 mg via ORAL
  Filled 2019-08-31 (×3): qty 1

## 2019-08-31 MED ORDER — ORAL CARE MOUTH RINSE
15.0000 mL | Freq: Two times a day (BID) | OROMUCOSAL | Status: DC
  Administered 2019-08-31 – 2019-09-05 (×9): 15 mL via OROMUCOSAL

## 2019-08-31 MED ORDER — DIAZEPAM 2 MG PO TABS
2.0000 mg | ORAL_TABLET | Freq: Three times a day (TID) | ORAL | Status: AC
  Administered 2019-08-31 – 2019-09-01 (×4): 2 mg via ORAL
  Filled 2019-08-31 (×5): qty 1

## 2019-08-31 MED ORDER — BUSPIRONE HCL 10 MG PO TABS
10.0000 mg | ORAL_TABLET | Freq: Every day | ORAL | Status: DC
  Administered 2019-08-31 – 2019-09-05 (×6): 10 mg via ORAL
  Filled 2019-08-31 (×7): qty 1

## 2019-08-31 MED ORDER — FUROSEMIDE 10 MG/ML IJ SOLN
20.0000 mg | Freq: Two times a day (BID) | INTRAMUSCULAR | Status: DC
  Administered 2019-08-31 – 2019-09-01 (×2): 20 mg via INTRAVENOUS
  Filled 2019-08-31 (×2): qty 2

## 2019-08-31 MED ORDER — SODIUM CHLORIDE 0.9% FLUSH: 10.0000 mL | INTRAVENOUS | Status: DC | PRN

## 2019-08-31 MED ORDER — ALBUMIN HUMAN 5 % IV SOLN
INTRAVENOUS | Status: AC
  Filled 2019-08-31: qty 250

## 2019-08-31 MED ORDER — SODIUM CHLORIDE 0.9% FLUSH
10.0000 mL | Freq: Two times a day (BID) | INTRAVENOUS | Status: DC
  Administered 2019-09-01 – 2019-09-02 (×3): 10 mL
  Administered 2019-09-03: 10:00:00 20 mL
  Administered 2019-09-03: 23:00:00 30 mL
  Administered 2019-09-04: 20:00:00 40 mL
  Administered 2019-09-04 – 2019-09-06 (×4): 10 mL

## 2019-08-31 MED ORDER — LEVALBUTEROL TARTRATE 45 MCG/ACT IN AERO: 2.0000 | INHALATION_SPRAY | Freq: Three times a day (TID) | RESPIRATORY_TRACT | Status: DC

## 2019-08-31 MED ORDER — KETOROLAC TROMETHAMINE 15 MG/ML IJ SOLN
15.0000 mg | Freq: Four times a day (QID) | INTRAMUSCULAR | Status: DC
  Administered 2019-08-31 – 2019-09-02 (×12): 15 mg via INTRAVENOUS
  Filled 2019-08-31 (×14): qty 1

## 2019-08-31 MED ORDER — VENLAFAXINE HCL ER 150 MG PO CP24
150.0000 mg | ORAL_CAPSULE | Freq: Every day | ORAL | Status: DC
  Administered 2019-08-31 – 2019-09-06 (×7): 150 mg via ORAL
  Filled 2019-08-31 (×7): qty 1

## 2019-08-31 MED ORDER — POTASSIUM CHLORIDE 10 MEQ/50ML IV SOLN: 10.0000 meq | INTRAVENOUS | Status: DC

## 2019-08-31 MED ORDER — GABAPENTIN 300 MG PO CAPS
600.0000 mg | ORAL_CAPSULE | Freq: Every day | ORAL | Status: DC
  Administered 2019-08-31 – 2019-09-05 (×6): 600 mg via ORAL
  Filled 2019-08-31 (×6): qty 2

## 2019-08-31 MED ORDER — COLCHICINE 0.6 MG PO TABS
0.6000 mg | ORAL_TABLET | Freq: Every day | ORAL | Status: DC
  Administered 2019-08-31 – 2019-09-06 (×7): 0.6 mg via ORAL
  Filled 2019-08-31 (×7): qty 1

## 2019-08-31 MED ORDER — LEVALBUTEROL HCL 1.25 MG/0.5ML IN NEBU
1.2500 mg | INHALATION_SOLUTION | Freq: Three times a day (TID) | RESPIRATORY_TRACT | Status: DC
  Administered 2019-08-31 – 2019-09-02 (×7): 1.25 mg via RESPIRATORY_TRACT
  Filled 2019-08-31 (×7): qty 0.5

## 2019-08-31 MED ORDER — CLOPIDOGREL BISULFATE 75 MG PO TABS
75.0000 mg | ORAL_TABLET | Freq: Every day | ORAL | Status: DC
  Administered 2019-08-31 – 2019-09-06 (×7): 75 mg via ORAL
  Filled 2019-08-31 (×7): qty 1

## 2019-08-31 MED ORDER — POTASSIUM CHLORIDE CRYS ER 20 MEQ PO TBCR
20.0000 meq | EXTENDED_RELEASE_TABLET | ORAL | Status: AC
  Administered 2019-08-31 – 2019-09-01 (×3): 20 meq via ORAL
  Filled 2019-08-31 (×3): qty 1

## 2019-08-31 MED ORDER — ASPIRIN 81 MG PO CHEW
81.0000 mg | CHEWABLE_TABLET | Freq: Every day | ORAL | Status: DC
  Administered 2019-08-31 – 2019-09-06 (×7): 81 mg via ORAL
  Filled 2019-08-31 (×7): qty 1

## 2019-08-31 MED ORDER — SODIUM CHLORIDE 0.9% IV SOLUTION: Freq: Once | INTRAVENOUS | Status: DC

## 2019-08-31 NOTE — Progress Notes (Signed)
1 Day Post-Op Procedure(s) (LRB): CORONARY ARTERY BYPASS GRAFTING (CABG) using LIMA to LAD; RIMA to PDL; endoscopic right greater saphenous vein harvest to Diag1; and left radial harvest sequenced to OM1 and OM2. (N/A) RADIAL ARTERY HARVEST (Left) INDOCYANINE GREEN FLUORESCENCE IMAGING (ICG) (N/A) TRANSESOPHAGEAL ECHOCARDIOGRAM (TEE) (N/A) Endovein Harvest Of Greater Saphenous Vein (Right) Subjective: Incisional pain  Objective: Vital signs in last 24 hours: Temp:  [97.9 F (36.6 C)-101.3 F (38.5 C)] 100.8 F (38.2 C) (02/05 0700) Pulse Rate:  [69-94] 86 (02/05 0700) Cardiac Rhythm: Normal sinus rhythm (02/05 0240) Resp:  [11-41] 32 (02/05 0700) BP: (88-146)/(62-88) 132/72 (02/05 0700) SpO2:  [83 %-100 %] 94 % (02/05 0725) Arterial Line BP: (65-158)/(37-85) 146/62 (02/05 0700) FiO2 (%):  [40 %-50 %] 40 % (02/04 1708) Weight:  [115.4 kg] 115.4 kg (02/05 0500)  Hemodynamic parameters for last 24 hours: PAP: (18-53)/(0-18) 27/2 CVP:  [1 mmHg] 1 mmHg CO:  [3.6 L/min-6.4 L/min] 6.2 L/min CI:  [1.6 L/min/m2-2.8 L/min/m2] 2.7 L/min/m2  Intake/Output from previous day: 02/04 0701 - 02/05 0700 In: 9668 [P.O.:540; I.V.:4849; MOQHU:7654; IV Piggyback:3037] Out: 5430 [Urine:2670; Blood:1600; Chest Tube:1160] Intake/Output this shift: No intake/output data recorded.  General appearance: alert and cooperative Neurologic: intact Heart: regular rate and rhythm, S1, S2 normal, no murmur, click, rub or gallop Lungs: clear to auscultation bilaterally Abdomen: soft, non-tender; bowel sounds normal; no masses,  no organomegaly Extremities: extremities normal, atraumatic, no cyanosis or edema Wound: dressed  Lab Results: Recent Labs    08/30/19 2000 08/30/19 2000 08/31/19 0330 08/31/19 0622  WBC 16.4*  --  13.2*  --   HGB 9.7*   < > 7.7* 6.8*  HCT 29.9*   < > 23.7* 20.0*  PLT 133*  --  99*  --    < > = values in this interval not displayed.   BMET:  Recent Labs     08/30/19 2000 08/30/19 2000 08/31/19 0330 08/31/19 0622  NA 141   < > 140 140  K 4.2   < > 4.4 4.1  CL 113*  --  111  --   CO2 19*  --  21*  --   GLUCOSE 117*  --  136*  --   BUN 7  --  8  --   CREATININE 0.72  --  0.74  --   CALCIUM 7.8*  --  8.5*  --    < > = values in this interval not displayed.    PT/INR:  Recent Labs    08/30/19 1407  LABPROT 15.6*  INR 1.3*   ABG    Component Value Date/Time   PHART 7.417 08/31/2019 0622   HCO3 21.2 08/31/2019 0622   TCO2 22 08/31/2019 0622   ACIDBASEDEF 3.0 (H) 08/31/2019 0622   O2SAT 92.0 08/31/2019 0622   CBG (last 3)  Recent Labs    08/31/19 0325 08/31/19 0409 08/31/19 0619  GLUCAP 144* 133* 110*    Assessment/Plan: S/P Procedure(s) (LRB): CORONARY ARTERY BYPASS GRAFTING (CABG) using LIMA to LAD; RIMA to PDL; endoscopic right greater saphenous vein harvest to Diag1; and left radial harvest sequenced to OM1 and OM2. (N/A) RADIAL ARTERY HARVEST (Left) INDOCYANINE GREEN FLUORESCENCE IMAGING (ICG) (N/A) TRANSESOPHAGEAL ECHOCARDIOGRAM (TEE) (N/A) Endovein Harvest Of Greater Saphenous Vein (Right) Mobilize remove pa cath  Pain control    LOS: 4 days    Linden Dolin 08/31/2019

## 2019-09-01 ENCOUNTER — Inpatient Hospital Stay (HOSPITAL_COMMUNITY): Payer: No Typology Code available for payment source

## 2019-09-01 DIAGNOSIS — Z951 Presence of aortocoronary bypass graft: Secondary | ICD-10-CM

## 2019-09-01 LAB — POCT I-STAT 7, (LYTES, BLD GAS, ICA,H+H)
Acid-base deficit: 2 mmol/L (ref 0.0–2.0)
Bicarbonate: 23.4 mmol/L (ref 20.0–28.0)
Calcium, Ion: 1.23 mmol/L (ref 1.15–1.40)
HCT: 20 % — ABNORMAL LOW (ref 39.0–52.0)
Hemoglobin: 6.8 g/dL — CL (ref 13.0–17.0)
O2 Saturation: 94 %
Patient temperature: 98.4
Potassium: 4 mmol/L (ref 3.5–5.1)
Sodium: 139 mmol/L (ref 135–145)
TCO2: 25 mmol/L (ref 22–32)
pCO2 arterial: 40 mmHg (ref 32.0–48.0)
pH, Arterial: 7.375 (ref 7.350–7.450)
pO2, Arterial: 74 mmHg — ABNORMAL LOW (ref 83.0–108.0)

## 2019-09-01 LAB — BASIC METABOLIC PANEL
Anion gap: 8 (ref 5–15)
BUN: 12 mg/dL (ref 6–20)
CO2: 23 mmol/L (ref 22–32)
Calcium: 8.2 mg/dL — ABNORMAL LOW (ref 8.9–10.3)
Chloride: 106 mmol/L (ref 98–111)
Creatinine, Ser: 0.65 mg/dL (ref 0.61–1.24)
GFR calc Af Amer: >60 mL/min (ref >60)
GFR calc non Af Amer: >60 mL/min (ref >60)
Glucose, Bld: 138 mg/dL — ABNORMAL HIGH (ref 70–99)
Potassium: 3.9 mmol/L (ref 3.5–5.1)
Sodium: 137 mmol/L (ref 135–145)

## 2019-09-01 LAB — GLUCOSE, CAPILLARY
Glucose-Capillary: 101 mg/dL — ABNORMAL HIGH (ref 70–99)
Glucose-Capillary: 104 mg/dL — ABNORMAL HIGH (ref 70–99)
Glucose-Capillary: 106 mg/dL — ABNORMAL HIGH (ref 70–99)
Glucose-Capillary: 114 mg/dL — ABNORMAL HIGH (ref 70–99)
Glucose-Capillary: 115 mg/dL — ABNORMAL HIGH (ref 70–99)
Glucose-Capillary: 119 mg/dL — ABNORMAL HIGH (ref 70–99)
Glucose-Capillary: 123 mg/dL — ABNORMAL HIGH (ref 70–99)
Glucose-Capillary: 125 mg/dL — ABNORMAL HIGH (ref 70–99)
Glucose-Capillary: 127 mg/dL — ABNORMAL HIGH (ref 70–99)
Glucose-Capillary: 129 mg/dL — ABNORMAL HIGH (ref 70–99)
Glucose-Capillary: 130 mg/dL — ABNORMAL HIGH (ref 70–99)
Glucose-Capillary: 135 mg/dL — ABNORMAL HIGH (ref 70–99)
Glucose-Capillary: 138 mg/dL — ABNORMAL HIGH (ref 70–99)
Glucose-Capillary: 145 mg/dL — ABNORMAL HIGH (ref 70–99)
Glucose-Capillary: 145 mg/dL — ABNORMAL HIGH (ref 70–99)
Glucose-Capillary: 149 mg/dL — ABNORMAL HIGH (ref 70–99)

## 2019-09-01 LAB — CBC
HCT: 23.6 % — ABNORMAL LOW (ref 39.0–52.0)
Hemoglobin: 7.6 g/dL — ABNORMAL LOW (ref 13.0–17.0)
MCH: 29.8 pg (ref 26.0–34.0)
MCHC: 32.2 g/dL (ref 30.0–36.0)
MCV: 92.5 fL (ref 80.0–100.0)
Platelets: 85 10*3/uL — ABNORMAL LOW (ref 150–400)
RBC: 2.55 MIL/uL — ABNORMAL LOW (ref 4.22–5.81)
RDW: 16.6 % — ABNORMAL HIGH (ref 11.5–15.5)
WBC: 10.1 10*3/uL (ref 4.0–10.5)
nRBC: 0 % (ref 0.0–0.2)

## 2019-09-01 LAB — MAGNESIUM: Magnesium: 2.2 mg/dL (ref 1.7–2.4)

## 2019-09-01 LAB — PREPARE RBC (CROSSMATCH)

## 2019-09-01 LAB — TRIGLYCERIDES: Triglycerides: 215 mg/dL — ABNORMAL HIGH (ref <150)

## 2019-09-01 MED ORDER — MAGNESIUM OXIDE 400 (241.3 MG) MG PO TABS
800.0000 mg | ORAL_TABLET | Freq: Two times a day (BID) | ORAL | Status: DC
  Administered 2019-09-01 – 2019-09-06 (×11): 800 mg via ORAL
  Filled 2019-09-01 (×11): qty 2

## 2019-09-01 MED ORDER — FUROSEMIDE 10 MG/ML IJ SOLN
40.0000 mg | Freq: Once | INTRAMUSCULAR | Status: AC
  Administered 2019-09-01: 09:00:00 40 mg via INTRAVENOUS
  Filled 2019-09-01: qty 4

## 2019-09-01 MED ORDER — SODIUM CHLORIDE 0.9% IV SOLUTION: Freq: Once | INTRAVENOUS | Status: AC

## 2019-09-01 MED ORDER — ISOSORBIDE DINITRATE 10 MG PO TABS
10.0000 mg | ORAL_TABLET | Freq: Three times a day (TID) | ORAL | Status: DC
  Administered 2019-09-01 – 2019-09-06 (×13): 10 mg via ORAL
  Filled 2019-09-01 (×13): qty 1

## 2019-09-01 MED ORDER — FUROSEMIDE 10 MG/ML IJ SOLN
6.0000 mg/h | INTRAVENOUS | Status: DC
  Administered 2019-09-01: 12:00:00 6 mg/h via INTRAVENOUS
  Filled 2019-09-01: qty 25

## 2019-09-01 MED ORDER — POTASSIUM CHLORIDE CRYS ER 20 MEQ PO TBCR
20.0000 meq | EXTENDED_RELEASE_TABLET | Freq: Two times a day (BID) | ORAL | Status: DC
  Administered 2019-09-01 – 2019-09-06 (×11): 20 meq via ORAL
  Filled 2019-09-01 (×11): qty 1

## 2019-09-01 NOTE — Progress Notes (Signed)
2 Days Post-Op Procedure(s) (LRB): CORONARY ARTERY BYPASS GRAFTING (CABG) using LIMA to LAD; RIMA to PDL; endoscopic right greater saphenous vein harvest to Diag1; and left radial harvest sequenced to OM1 and OM2. (N/A) RADIAL ARTERY HARVEST (Left) INDOCYANINE GREEN FLUORESCENCE IMAGING (ICG) (N/A) TRANSESOPHAGEAL ECHOCARDIOGRAM (TEE) (N/A) Endovein Harvest Of Greater Saphenous Vein (Right) Subjective: Incisional pain  Objective: Vital signs in last 24 hours: Temp:  [98.1 F (36.7 C)-100.4 F (38 C)] 98.4 F (36.9 C) (02/06 0400) Pulse Rate:  [72-106] 103 (02/06 0645) Cardiac Rhythm: Normal sinus rhythm;Sinus tachycardia (02/06 0400) Resp:  [0-40] 17 (02/06 0645) BP: (76-123)/(27-77) 112/71 (02/06 0630) SpO2:  [80 %-97 %] 95 % (02/06 0645) Arterial Line BP: (83-159)/(41-70) 107/48 (02/06 0645) Weight:  [710 kg] 117 kg (02/06 0500)  Hemodynamic parameters for last 24 hours:    Intake/Output from previous day: 02/05 0701 - 02/06 0700 In: 2134.5 [P.O.:250; I.V.:1369.5; Blood:315; IV Piggyback:200] Out: 2735 [Urine:1935; Chest Tube:800] Intake/Output this shift: Total I/O In: 86.8 [I.V.:86.8] Out: 140 [Urine:100; Chest Tube:40]  General appearance: alert and cooperative Neurologic: intact Heart: regular rate and rhythm, S1, S2 normal, no murmur, click, rub or gallop Lungs: clear to auscultation bilaterally Abdomen: soft, non-tender; bowel sounds normal; no masses,  no organomegaly Extremities: extremities normal, atraumatic, no cyanosis or edema Wound: dressed, dry  Lab Results: Recent Labs    08/31/19 1700 08/31/19 1700 09/01/19 0500 09/01/19 0506  WBC 12.7*  --  10.1  --   HGB 7.8*   < > 7.6* 6.8*  HCT 23.7*   < > 23.6* 20.0*  PLT 90*  --  85*  --    < > = values in this interval not displayed.   BMET:  Recent Labs    08/31/19 1700 08/31/19 1700 09/01/19 0500 09/01/19 0506  NA 134*   < > 137 139  K 3.7   < > 3.9 4.0  CL 104  --  106  --   CO2 21*  --   23  --   GLUCOSE 117*  --  138*  --   BUN 9  --  12  --   CREATININE 0.73  --  0.65  --   CALCIUM 8.3*  --  8.2*  --    < > = values in this interval not displayed.    PT/INR:  Recent Labs    08/30/19 1407  LABPROT 15.6*  INR 1.3*   ABG    Component Value Date/Time   PHART 7.375 09/01/2019 0506   HCO3 23.4 09/01/2019 0506   TCO2 25 09/01/2019 0506   ACIDBASEDEF 2.0 09/01/2019 0506   O2SAT 94.0 09/01/2019 0506   CBG (last 3)  Recent Labs    09/01/19 0206 09/01/19 0412 09/01/19 0752  GLUCAP 130* 145* 135*    Assessment/Plan: S/P Procedure(s) (LRB): CORONARY ARTERY BYPASS GRAFTING (CABG) using LIMA to LAD; RIMA to PDL; endoscopic right greater saphenous vein harvest to Diag1; and left radial harvest sequenced to OM1 and OM2. (N/A) RADIAL ARTERY HARVEST (Left) INDOCYANINE GREEN FLUORESCENCE IMAGING (ICG) (N/A) TRANSESOPHAGEAL ECHOCARDIOGRAM (TEE) (N/A) Endovein Harvest Of Greater Saphenous Vein (Right) Mobilize Diuresis transfuse one unit prbc   LOS: 5 days    Linden Dolin 09/01/2019

## 2019-09-02 ENCOUNTER — Inpatient Hospital Stay: Payer: Self-pay

## 2019-09-02 ENCOUNTER — Inpatient Hospital Stay (HOSPITAL_COMMUNITY): Payer: No Typology Code available for payment source

## 2019-09-02 LAB — BPAM RBC
Blood Product Expiration Date: 202102282359
Blood Product Expiration Date: 202103012359
ISSUE DATE / TIME: 202102050817
ISSUE DATE / TIME: 202102060912
Unit Type and Rh: 6200
Unit Type and Rh: 6200

## 2019-09-02 LAB — CBC
HCT: 27.4 % — ABNORMAL LOW (ref 39.0–52.0)
Hemoglobin: 8.7 g/dL — ABNORMAL LOW (ref 13.0–17.0)
MCH: 30.5 pg (ref 26.0–34.0)
MCHC: 31.8 g/dL (ref 30.0–36.0)
MCV: 96.1 fL (ref 80.0–100.0)
Platelets: 120 10*3/uL — ABNORMAL LOW (ref 150–400)
RBC: 2.85 MIL/uL — ABNORMAL LOW (ref 4.22–5.81)
RDW: 15.8 % — ABNORMAL HIGH (ref 11.5–15.5)
WBC: 10.9 10*3/uL — ABNORMAL HIGH (ref 4.0–10.5)
nRBC: 0 % (ref 0.0–0.2)

## 2019-09-02 LAB — BASIC METABOLIC PANEL
Anion gap: 8 (ref 5–15)
BUN: 12 mg/dL (ref 6–20)
CO2: 25 mmol/L (ref 22–32)
Calcium: 8.5 mg/dL — ABNORMAL LOW (ref 8.9–10.3)
Chloride: 104 mmol/L (ref 98–111)
Creatinine, Ser: 0.88 mg/dL (ref 0.61–1.24)
GFR calc Af Amer: >60 mL/min (ref >60)
GFR calc non Af Amer: >60 mL/min (ref >60)
Glucose, Bld: 116 mg/dL — ABNORMAL HIGH (ref 70–99)
Potassium: 4.3 mmol/L (ref 3.5–5.1)
Sodium: 137 mmol/L (ref 135–145)

## 2019-09-02 LAB — TYPE AND SCREEN
ABO/RH(D): A POS
Antibody Screen: NEGATIVE
Unit division: 0
Unit division: 0

## 2019-09-02 LAB — GLUCOSE, CAPILLARY
Glucose-Capillary: 100 mg/dL — ABNORMAL HIGH (ref 70–99)
Glucose-Capillary: 107 mg/dL — ABNORMAL HIGH (ref 70–99)
Glucose-Capillary: 110 mg/dL — ABNORMAL HIGH (ref 70–99)
Glucose-Capillary: 112 mg/dL — ABNORMAL HIGH (ref 70–99)
Glucose-Capillary: 114 mg/dL — ABNORMAL HIGH (ref 70–99)
Glucose-Capillary: 116 mg/dL — ABNORMAL HIGH (ref 70–99)
Glucose-Capillary: 117 mg/dL — ABNORMAL HIGH (ref 70–99)
Glucose-Capillary: 119 mg/dL — ABNORMAL HIGH (ref 70–99)
Glucose-Capillary: 122 mg/dL — ABNORMAL HIGH (ref 70–99)
Glucose-Capillary: 123 mg/dL — ABNORMAL HIGH (ref 70–99)
Glucose-Capillary: 140 mg/dL — ABNORMAL HIGH (ref 70–99)
Glucose-Capillary: 144 mg/dL — ABNORMAL HIGH (ref 70–99)

## 2019-09-02 LAB — MAGNESIUM: Magnesium: 1.9 mg/dL (ref 1.7–2.4)

## 2019-09-02 MED ORDER — LEVALBUTEROL HCL 1.25 MG/0.5ML IN NEBU
1.2500 mg | INHALATION_SOLUTION | Freq: Two times a day (BID) | RESPIRATORY_TRACT | Status: DC
  Administered 2019-09-03 – 2019-09-04 (×3): 1.25 mg via RESPIRATORY_TRACT
  Filled 2019-09-02 (×4): qty 0.5

## 2019-09-02 MED ORDER — INSULIN ASPART 100 UNIT/ML ~~LOC~~ SOLN
0.0000 [IU] | Freq: Three times a day (TID) | SUBCUTANEOUS | Status: DC
  Administered 2019-09-03 – 2019-09-06 (×5): 2 [IU] via SUBCUTANEOUS

## 2019-09-02 MED ORDER — INSULIN ASPART 100 UNIT/ML ~~LOC~~ SOLN: 3.0000 [IU] | Freq: Three times a day (TID) | SUBCUTANEOUS | Status: DC

## 2019-09-02 MED ORDER — INSULIN DETEMIR 100 UNIT/ML ~~LOC~~ SOLN
12.0000 [IU] | Freq: Two times a day (BID) | SUBCUTANEOUS | Status: DC
  Administered 2019-09-02 – 2019-09-06 (×9): 12 [IU] via SUBCUTANEOUS
  Filled 2019-09-02 (×11): qty 0.12

## 2019-09-02 MED ORDER — ATORVASTATIN CALCIUM 80 MG PO TABS
80.0000 mg | ORAL_TABLET | Freq: Every day | ORAL | Status: DC
  Administered 2019-09-02 – 2019-09-05 (×4): 80 mg via ORAL
  Filled 2019-09-02 (×3): qty 1

## 2019-09-02 MED ORDER — FUROSEMIDE 10 MG/ML IJ SOLN: 20.0000 mg | Freq: Four times a day (QID) | INTRAMUSCULAR | Status: DC

## 2019-09-02 MED ORDER — FUROSEMIDE 10 MG/ML IJ SOLN
20.0000 mg | Freq: Three times a day (TID) | INTRAMUSCULAR | Status: DC
  Administered 2019-09-02 – 2019-09-03 (×6): 20 mg via INTRAVENOUS
  Filled 2019-09-02 (×6): qty 2

## 2019-09-02 MED ORDER — INSULIN ASPART 100 UNIT/ML ~~LOC~~ SOLN: 0.0000 [IU] | Freq: Every day | SUBCUTANEOUS | Status: DC

## 2019-09-02 MED ORDER — MORPHINE SULFATE (PF) 2 MG/ML IV SOLN
1.0000 mg | INTRAVENOUS | Status: DC | PRN
  Administered 2019-09-03 (×2): 2 mg via INTRAVENOUS
  Filled 2019-09-02 (×2): qty 1

## 2019-09-02 MED ORDER — MIDODRINE HCL 5 MG PO TABS
5.0000 mg | ORAL_TABLET | Freq: Three times a day (TID) | ORAL | Status: DC
  Administered 2019-09-02 – 2019-09-03 (×5): 5 mg via ORAL
  Filled 2019-09-02 (×5): qty 1

## 2019-09-02 MED ORDER — SODIUM CHLORIDE 0.9% FLUSH: 10.0000 mL | INTRAVENOUS | Status: DC | PRN

## 2019-09-02 MED ORDER — SODIUM CHLORIDE 0.9% FLUSH
10.0000 mL | Freq: Two times a day (BID) | INTRAVENOUS | Status: DC
  Administered 2019-09-02 (×2): 10 mL
  Administered 2019-09-03: 10:00:00 20 mL

## 2019-09-02 NOTE — Progress Notes (Signed)
Peripherally Inserted Central Catheter/Midline Placement  The IV Nurse has discussed with the patient and/or persons authorized to consent for the patient, the purpose of this procedure and the potential benefits and risks involved with this procedure.  The benefits include less needle sticks, lab draws from the catheter, and the patient may be discharged home with the catheter. Risks include, but not limited to, infection, bleeding, blood clot (thrombus formation), and puncture of an artery; nerve damage and irregular heartbeat and possibility to perform a PICC exchange if needed/ordered by physician.  Alternatives to this procedure were also discussed.  Bard Power PICC patient education guide, fact sheet on infection prevention and patient information card has been provided to patient /or left at bedside.    PICC/Midline Placement Documentation  PICC Double Lumen 09/02/19 PICC Right Brachial 42 cm 0 cm (Active)  Indication for Insertion or Continuance of Line Prolonged intravenous therapies 09/02/19 1200  Exposed Catheter (cm) 0 cm 09/02/19 1200  Site Assessment Clean;Dry;Intact 09/02/19 1200  Lumen #1 Status Flushed;Blood return noted;Saline locked 09/02/19 1200  Lumen #2 Status Flushed;Blood return noted;Saline locked 09/02/19 1200  Dressing Type Transparent 09/02/19 1200  Dressing Status Clean;Dry;Intact;Antimicrobial disc in place;Other (Comment) 09/02/19 1200  Line Care Connections checked and tightened 09/02/19 1200  Dressing Change Due 09/09/19 09/02/19 1200       Edwin Cap 09/02/2019, 12:19 PM

## 2019-09-02 NOTE — Progress Notes (Signed)
Physical Therapy Re-Evaluation and Treatment Patient Details Name: Jacob Brady MRN: [id number] DOB: [birth year] Today's Date: 09/02/2019    History of Present Illness Pt is a 59 yo male with poorly controlled insulin-dependent DM, HTN, HLD, major depressive disorder, PTSD, and pancreatitis who was seen for the evaluation of chest pain. Upon admission, pt dx with unstable angina and reduced EF (45-54%). He underwent CABG 2/4.    PT Comments    Re-Evaluation complete following CABG. Pt required min assist bed mobility, min assist transfers and min guard assist ambulation 1500' with eva walker. Pt on 9L O2 at rest. Mobilized on 10L with SpO2 > 90%. Fatigue noted at end of session. Goals reviewed. Current POC remains appropriate.     Follow Up Recommendations  No PT follow up;Supervision for mobility/OOB(cardiac rehab)     Equipment Recommendations  Other (comment)(TBD)    Recommendations for Other Services       Precautions / Restrictions Precautions Precautions: Fall;Other (comment);Sternal Precaution Comments: chest tube, foley catheter, central line, educated on sternal precautions    Mobility  Bed Mobility Overal bed mobility: Needs Assistance Bed Mobility: Supine to Sit     Supine to sit: Min assist     General bed mobility comments: assist to maintain sternal precautions  Transfers Overall transfer level: Needs assistance Equipment used: Ambulation equipment used Transfers: Sit to/from Stand Sit to Stand: Min assist         General transfer comment: cues for sternal precautions, min guard from elevated surface  Ambulation/Gait Ambulation/Gait assistance: Min guard Gait Distance (Feet): 1500 Feet Assistive device: Bilateral platform walker(eva walker) Gait Pattern/deviations: Step-through pattern;Decreased stride length   Gait velocity interpretation: 1.31 - 2.62 ft/sec, indicative of limited community ambulator General Gait Details: Ambulated with eva  walker on 10L O2. SpO2 > 90%. Multiple standing rest breaks. Pt with c/o mild dizziness. No LOB noted.   Stairs             Wheelchair Mobility    Modified Rankin (Stroke Patients Only)       Balance Overall balance assessment: Needs assistance Sitting-balance support: No upper extremity supported;Feet supported Sitting balance-Leahy Scale: Good     Standing balance support: During functional activity;Bilateral upper extremity supported;No upper extremity supported Standing balance-Leahy Scale: Fair Standing balance comment: static stand without UE support. [place] walker for ambulation                            Cognition Arousal/Alertness: Awake/alert Behavior During Therapy: WFL for tasks assessed/performed;Flat affect Overall Cognitive Status: Within Functional Limits for tasks assessed                                        Exercises      General Comments General comments (skin integrity, edema, etc.): Pt on 9L O2 at rest. Mobilized on 10L. SpO2 > 90%.      Pertinent Vitals/Pain Pain Assessment: Faces Faces Pain Scale: Hurts even more Pain Location: sx incision site, foley catheter site Pain Descriptors / Indicators: Grimacing;Guarding;Discomfort Pain Intervention(s): Monitored during session;Repositioned;Patient requesting pain meds-RN notified    Home Living                      Prior Function            PT Goals (current goals can now be found in  the care plan section) Acute Rehab PT Goals Patient Stated Goal: return home PT Goal Formulation: With patient Time For Goal Achievement: 09/16/19 Potential to Achieve Goals: Good Progress towards PT goals: Progressing toward goals    Frequency    Min 3X/week      PT Plan Current plan remains appropriate    Co-evaluation              AM-PAC PT "6 Clicks" Mobility   Outcome Measure  Help needed turning from your back to your side while in a flat bed  without using bedrails?: None Help needed moving from lying on your back to sitting on the side of a flat bed without using bedrails?: A Little Help needed moving to and from a bed to a chair (including a wheelchair)?: A Little Help needed standing up from a chair using your arms (e.g., wheelchair or bedside chair)?: A Little Help needed to walk in hospital room?: A Little Help needed climbing 3-5 steps with a railing? : A Lot 6 Click Score: 18    End of Session Equipment Utilized During Treatment: Gait belt;Oxygen Activity Tolerance: Patient tolerated treatment well Patient left: in chair;with nursing/sitter in room;with call bell/phone within reach Nurse Communication: Mobility status PT Visit Diagnosis: Difficulty in walking, not elsewhere classified (R26.2);History of falling (Z91.81);Pain     Time: 1025-1110 PT Time Calculation (min) (ACUTE ONLY): 45 min  Charges:  $Gait Training: 23-37 mins $Therapeutic Activity: 8-22 mins                     Aida Raider, PT  Office # [phone] Pager [phone]    Ilda Foil 09/02/2019, 1:09 PM

## 2019-09-02 NOTE — Progress Notes (Signed)
3 Days Post-Op Procedure(s) (LRB): CORONARY ARTERY BYPASS GRAFTING (CABG) using LIMA to LAD; RIMA to PDL; endoscopic right greater saphenous vein harvest to Diag1; and left radial harvest sequenced to OM1 and OM2. (N/A) RADIAL ARTERY HARVEST (Left) INDOCYANINE GREEN FLUORESCENCE IMAGING (ICG) (N/A) TRANSESOPHAGEAL ECHOCARDIOGRAM (TEE) (N/A) Endovein Harvest Of Greater Saphenous Vein (Right) Subjective: Needs hi flow O2 and persistent neo drip Walked in hall Will place PICC and DC sleeve Transition lasix drip to tid and DC foley in am CXR clear Objective: Vital signs in last 24 hours: Temp:  [97.8 F (36.6 C)-98.6 F (37 C)] 98.6 F (37 C) (02/07 0811) Pulse Rate:  [68-105] 85 (02/07 0811) Cardiac Rhythm: Normal sinus rhythm (02/07 0330) Resp:  [10-27] 12 (02/07 0811) BP: (85-134)/(60-87) 89/67 (02/07 0330) SpO2:  [76 %-100 %] 100 % (02/07 0811) Arterial Line BP: (80-164)/(42-84) 92/47 (02/07 0330) Weight:  [115.8 kg] 115.8 kg (02/07 0500)  Hemodynamic parameters for last 24 hours:    Intake/Output from previous day: 02/06 0701 - 02/07 0700 In: 2665.6 [P.O.:1440; I.V.:910.6; Blood:315] Out: 3335 [Urine:2675; Chest Tube:660] Intake/Output this shift: No intake/output data recorded.       Exam    General- alert and comfortable    Neck- no JVD, no cervical adenopathy palpable, no carotid bruit   Lungs- clear without rales, wheezes   Cor- regular rate and rhythm, no murmur , gallop   Abdomen- soft, non-tender   Extremities - warm, non-tender, minimal edema   Neuro- oriented, appropriate, no focal weakness   Lab Results: Recent Labs    09/01/19 0500 09/01/19 0500 09/01/19 0506 09/02/19 0500  WBC 10.1  --   --  10.9*  HGB 7.6*   < > 6.8* 8.7*  HCT 23.6*   < > 20.0* 27.4*  PLT 85*  --   --  120*   < > = values in this interval not displayed.   BMET:  Recent Labs    09/01/19 0500 09/01/19 0500 09/01/19 0506 09/02/19 0500  NA 137   < > 139 137  K 3.9   < >  4.0 4.3  CL 106  --   --  104  CO2 23  --   --  25  GLUCOSE 138*  --   --  116*  BUN 12  --   --  12  CREATININE 0.65  --   --  0.88  CALCIUM 8.2*  --   --  8.5*   < > = values in this interval not displayed.    PT/INR:  Recent Labs    08/30/19 1407  LABPROT 15.6*  INR 1.3*   ABG    Component Value Date/Time   PHART 7.375 09/01/2019 0506   HCO3 23.4 09/01/2019 0506   TCO2 25 09/01/2019 0506   ACIDBASEDEF 2.0 09/01/2019 0506   O2SAT 94.0 09/01/2019 0506   CBG (last 3)  Recent Labs    09/02/19 0504 09/02/19 0622 09/02/19 0821  GLUCAP 116* 123* 144*    Assessment/Plan: S/P Procedure(s) (LRB): CORONARY ARTERY BYPASS GRAFTING (CABG) using LIMA to LAD; RIMA to PDL; endoscopic right greater saphenous vein harvest to Diag1; and left radial harvest sequenced to OM1 and OM2. (N/A) RADIAL ARTERY HARVEST (Left) INDOCYANINE GREEN FLUORESCENCE IMAGING (ICG) (N/A) TRANSESOPHAGEAL ECHOCARDIOGRAM (TEE) (N/A) Endovein Harvest Of Greater Saphenous Vein (Right)   Diuresis, DM control, DC lines,tubes   LOS: 6 days    Kathlee Nations Trigt III 09/02/2019

## 2019-09-03 ENCOUNTER — Inpatient Hospital Stay (HOSPITAL_COMMUNITY): Payer: No Typology Code available for payment source

## 2019-09-03 LAB — BASIC METABOLIC PANEL
Anion gap: 9 (ref 5–15)
BUN: 23 mg/dL — ABNORMAL HIGH (ref 6–20)
CO2: 25 mmol/L (ref 22–32)
Calcium: 8.8 mg/dL — ABNORMAL LOW (ref 8.9–10.3)
Chloride: 104 mmol/L (ref 98–111)
Creatinine, Ser: 1.2 mg/dL (ref 0.61–1.24)
GFR calc Af Amer: >60 mL/min (ref >60)
GFR calc non Af Amer: >60 mL/min (ref >60)
Glucose, Bld: 121 mg/dL — ABNORMAL HIGH (ref 70–99)
Potassium: 4.1 mmol/L (ref 3.5–5.1)
Sodium: 138 mmol/L (ref 135–145)

## 2019-09-03 LAB — CBC
HCT: 26.7 % — ABNORMAL LOW (ref 39.0–52.0)
Hemoglobin: 8.4 g/dL — ABNORMAL LOW (ref 13.0–17.0)
MCH: 30.8 pg (ref 26.0–34.0)
MCHC: 31.5 g/dL (ref 30.0–36.0)
MCV: 97.8 fL (ref 80.0–100.0)
Platelets: 136 10*3/uL — ABNORMAL LOW (ref 150–400)
RBC: 2.73 MIL/uL — ABNORMAL LOW (ref 4.22–5.81)
RDW: 15.3 % (ref 11.5–15.5)
WBC: 8.5 10*3/uL (ref 4.0–10.5)
nRBC: 0 % (ref 0.0–0.2)

## 2019-09-03 LAB — GLUCOSE, CAPILLARY
Glucose-Capillary: 113 mg/dL — ABNORMAL HIGH (ref 70–99)
Glucose-Capillary: 138 mg/dL — ABNORMAL HIGH (ref 70–99)
Glucose-Capillary: 96 mg/dL (ref 70–99)
Glucose-Capillary: 121 mg/dL — ABNORMAL HIGH (ref 70–99)
Glucose-Capillary: 119 mg/dL — ABNORMAL HIGH (ref 70–99)

## 2019-09-03 NOTE — Discharge Summary (Signed)
Physician Discharge Summary  Patient ID: Jacob Brady MRN: [id number] DOB/AGE: [birth year] 59 y.o.  Admit date: 08/26/2019 Discharge date: 09/06/2019  Admission Diagnoses: Unstable angina pectoris Coronary artery disease Hypertension Dyslipidemia Type 2 diabetes mellitus Diabetic ketoacidosis History of recent suicidal ideation History of recurrent major depression PTSD  Discharge Diagnoses:   S/P CABG x 5 Unstable angina pectoris Coronary artery disease Hypertension Dylipidemia Type 2 diabetes mellitus Diabetic ketoacidosis History of recent suicidal ideation History of recurrent major Depression PTSD    Discharged Condition: stable   History of Present Illness:      59 yo man with known DM, HL, HTN began having CP in Dec. This progressed and he was seen at [facility] where r/o for MI in mid-Jan. He planned to f/u at [facility] but his pain worsened to point of suicidal ideation. He has continued to smoke cigarettes through this. He was admitted this week with worsening pain. 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 Consult received for CABG. Has been stable on NTG and heparin gtt. coronary bypass grafting was offered and he elected to proceed. Pre-op work up was completed and satisfactory.   Hospital Course: Mr. Majette was admitted and treated for acute diabetic ketoacidosis. He was seen in consultation by the cardiology service and urgent left heart cath was performed demonstrating severe multivessel coronary artery disease.  Mr. Venhuizen remained stable following left heart cath. He was taken to the OR on 08/30/19 where CABG x 4 was carried out without complication. Please see the operative note below. Following the procedure, he separated from cardiopulmonary bypass with no difficulty and was later transferred to the CVICU. He remained hemodynamically stable but required vasopressor support for several days. By the 3rd pos-op day, he was maintaining adequate BP off  of neo-synephrine and with oral midodrine He was transfused with PRBC's early post op for Hgb of 6.8. His cardiac rhythm remained stable. He was transferred to 4E progressive care on post-op day 4.  He continued to make progress toward independent mobility. His CXR on the day of discharge showed no significant pleural effusions but did show some increase I interstitial markings c/w mild pulmonary edema. Lasix will be continued at discharge.   He was weaned from supplemental O2 and was maintaining adequate O2 saturation on RA at the time of discharge.      Consults: cardiology  Significant Diagnostic Studies: {  LEFT HEART CATH AND CORONARY ANGIOGRAPHY  Conclusion    Prox RCA lesion is 95% stenosed.  Prox Cx lesion is 80% stenosed.  Prox Cx to Mid Cx lesion is 85% stenosed.  Mid Cx lesion is 95% stenosed.  Dist Cx-1 lesion is 90% stenosed.  Dist Cx-2 lesion is 50% stenosed.  Ost LAD to Prox LAD lesion is 75% stenosed.  1st Diag lesion is 90% stenosed.  Mid LAD lesion is 40% stenosed.  There is mild left ventricular systolic dysfunction.  The left ventricular ejection fraction is 45-50% by visual estimate.   Severe multivessel CAD with 75% eccentric near ostial LAD stenosis, diffuse 90% stenosis in the first diagonal branch of the LAD with 40% mid LAD stenosis; segmental high-grade multiple stenoses in a large left circumflex vessel with 80 and 85% proximal stenosis, 95% stenosis at the bifurcation of a large marginal branch followed by 90% stenosis and distal 50% stenosis; large dominant RCA with 95% ulcerated plaque in the proximal to mid vessel.  Mild LV dysfunction with EF estimated 45 to 50% and suggestion of mild  distal inferior hypocontractility.  LVEDP 18 mm.  RECOMMENDATION: Angiographic findings were reviewed with Dr. Herbie Baltimore who had seen the patient in cardiology consult.  The patient will be started on IV nitroglycerin drip.  He will to new amlodipine and  beta-blocker therapy will be initiated.  Will change statin therapy to atorvastatin 80 mg.  We will add fenofibrate with his marked hypertriglyceridemia and also consider Vascepa post discharge.  He will need stabilization of his diabetic status.  Surgical consultation will be obtained in am.     Diagnostic Dominance: Right     ECHOCARDIOGRAM REPORT    Patient Name:  Jacob Brady Date of Exam: 08/28/2019  Medical Rec #: [id number]      Height:    70.0 in  Accession #:  [id number]     Weight:    249.1 lb  Date of Birth: October 20, 1960      BSA:     2.29 m  Patient Age:  59 years      BP:      140/81 mmHg  Patient Gender: M          HR:      77 bpm.  Exam Location: Inpatient   Procedure: 2D Echo, Cardiac Doppler and Color Doppler   Indications:  I25.110 Atherosclerotic heart disease of native coronary  artery         with unstable angina pectoris    History:    Patient has no prior history of Echocardiogram  examinations.         Abnormal ECG, Signs/Symptoms:Chest Pain; Risk         Factors:Hypertension and Diabetes.    Sonographer:  Sheralyn Boatman RDCS  Referring Phys: [phone] THOMAS A KELLY     Sonographer Comments: Image acquisition challenging due to respiratory  motion.  IMPRESSIONS    1. Left ventricular ejection fraction, by visual estimation, is 60 to  65%. The left ventricle has normal function. Moderately increased left  ventricular posterior wall thickness. There is severely increased left  ventricular hypertrophy of the basal  septum.  2. The left ventricle has no regional wall motion abnormalities.  3. Left ventricular diastolic parameters are consistent with Grade I  diastolic dysfunction (impaired relaxation).  4. Global right ventricle has normal systolic function.The right  ventricular size is normal. No increase in right ventricular wall  thickness.  5.  Left atrial size was normal.  6. Right atrial size was normal.  7. The mitral valve is normal in structure. No evidence of mitral valve  regurgitation. No evidence of mitral stenosis.  8. The tricuspid valve is normal in structure. Tricuspid valve  regurgitation is not demonstrated.  9. The aortic valve is tricuspid. There is moderate thickening and  moderate calcification of the aortic valve. Aortic valve regurgitation is  not visualized. No evidence of aortic valve sclerosis or stenosis.  10. The pulmonic valve was normal in structure. Pulmonic valve  regurgitation is not visualized.  11. The inferior vena cava is normal in size with greater than 50%  respiratory variability, suggesting right atrial pressure of 3 mmHg.    CHEST - 2 VIEW  09/06/19  COMPARISON:  09/04/2019  FINDINGS: Cardiac shadow is stable. Postsurgical changes are again seen. Right-sided PICC line is noted at the cavoatrial junction. Persistent interstitial changes are noted on the left with slight increase on the right consistent with mild edema. Small posterior effusions are seen. No acute bony abnormality is noted.  IMPRESSION: Slight increase in interstitial markings  particularly on the right consistent with edema.   Electronically Signed   By: Alcide Clever M.D.   On: 09/06/2019 09:38  Treatments:   CARDIOTHORACIC SURGERY OPERATIVE NOTE  Date of Procedure:    08/30/2019  Preoperative Diagnosis:      Severe 3-vessel Coronary Artery Disease with mildly depressed LV function  Postoperative Diagnosis:    Same  Procedure:        Coronary Artery Bypass Grafting x 5             Left Internal Mammary Artery to Distal Left Anterior Descending Coronary Artery; Pedicled RIMA to right Posterolateral Coronary Artery; Left radial artery Graft to 1st and 2nd Obtuse Marginal Branches of Left Circumflex Coronary Artery; Sapheonous Vein Graft to 1st  Diagonal Branch Coronary Artery; Endoscopic Vein  Harvest from right Thigh; left open radial artery harvest; bilateral IMA harvesting Completion graft surveillance  with indocyanine green fluorescence imaging (SPY) Multilevel rib block with expare/marcaine solution Rigid sternal reconstruction with linear plating system  Surgeon:        Nicholos Johns MD  Assistant:       Gaynelle Arabian PA-C  Anesthesia:    get  Operative Findings: ? Mildly reduced left ventricular systolic function ? good quality  mammary artery conduits ? good quality saphenous vein and left radial artery conduits ? Good quality target vessels for grafting    BRIEF CLINICAL NOTE AND INDICATIONS FOR SURGERY  59 yo man has had worsening CP for past few weeks. Ultimately admitted for intractable angina and underwent LHC showing severe 3V CAD; presents now for CABG.   Discharge Exam: Blood pressure (!) 175/120, pulse 75, temperature 97.7 F (36.5 C), temperature source Oral, resp. rate 15, height 5\' 10"  (1.778 m), weight 110.9 kg, SpO2 91 %.  Physical Exam: General appearance:alert, cooperative and no distress Neurologic:intact Heart:regular rate and rhythm Lungs:Breath sounds are clear anterior and posterior.  Abdomen:soft and non-tender Extremities:all well perfused, expected bruising and mild erythema right thigh. Wound:All incisions to chest, left arm, right thigh are intact and dry.  Disposition:  Discharged to home in stable condition with home health PT and rolling walker.     Allergies as of 09/06/2019      Reactions   Hctz [hydrochlorothiazide] Other (See Comments)   Unknown rxn, "they just told me not to take it anymore"   Seroquel [quetiapine Fumarate] Other (See Comments)   Elevates liver enzymes      Medication List    STOP taking these medications   amLODipine 10 MG tablet Commonly known as: NORVASC   hydrALAZINE 10 MG tablet Commonly known as: APRESOLINE   ibuprofen 200 MG tablet Commonly known as: ADVIL     TAKE  these medications   aspirin EC 81 MG tablet Take 1 tablet (81 mg total) by mouth daily.   atorvastatin 80 MG tablet Commonly known as: LIPITOR Take 1 tablet (80 mg total) by mouth daily at 6 PM.   busPIRone 10 MG tablet Commonly known as: BUSPAR Take 10 mg by mouth at bedtime.   clopidogrel 75 MG tablet Commonly known as: PLAVIX Take 1 tablet (75 mg total) by mouth daily.   colchicine 0.6 MG tablet Take 1 tablet (0.6 mg total) by mouth daily.   divalproex 500 MG DR tablet Commonly known as: DEPAKOTE Take 1,000 mg by mouth at bedtime.   furosemide 40 MG tablet Commonly known as: LASIX Take 1 tablet (40 mg total) by mouth daily for 10 days.   gabapentin  300 MG capsule Commonly known as: NEURONTIN Take 600 mg by mouth at bedtime.   insulin NPH-regular Human (70-30) 100 UNIT/ML injection Inject 25 Units into the skin 2 (two) times daily with a meal.   isosorbide mononitrate 30 MG 24 hr tablet Commonly known as: IMDUR Take 1 tablet (30 mg total) by mouth daily. For 3 months then stop.   lisinopril 40 MG tablet Commonly known as: ZESTRIL Take 40 mg by mouth daily.   metoprolol tartrate 25 MG tablet Commonly known as: LOPRESSOR Take 1 tablet (25 mg total) by mouth 2 (two) times daily.   mirtazapine 30 MG tablet Commonly known as: REMERON Take 1 tablet (30 mg total) by mouth at bedtime.   potassium chloride SA 20 MEQ tablet Commonly known as: KLOR-CON Take 1 tablet (20 mEq total) by mouth daily.   traMADol 50 MG tablet Commonly known as: ULTRAM Take 1 tablet (50 mg total) by mouth every 4 (four) hours as needed for up to 7 days for moderate pain.   venlafaxine 75 MG tablet Commonly known as: EFFEXOR Take 150 mg by mouth every morning.            Durable Medical Equipment  (From admission, onward)         Start     Ordered   09/06/19 0926  For home use only DME 4 wheeled rolling walker with seat  Once    Question:  Patient needs a walker to treat with  the following condition  Answer:  Weakness generalized   09/06/19 3664         Follow-up Information    Linden Dolin, MD. Go on 09/10/2019.   Specialty: Cardiothoracic Surgery Why: You have and appointment with Dr. Vickey Sages on Monday 09/10/19 at 2pm.  Contact information: [address] [place] [address] [phone]        Marcelino Duster, PA. Go on 09/19/2019.   Specialties: Physician Assistant, Cardiology, Radiology Why: You have a cardilogy follow up appointment with Micah Flesher, PA-C on Wednesday 09/19/19 at 10:15am.  Contact information: [address] [place] [address] [phone]          The patient has been discharged on:   1.Beta Blocker:  Yes [x   ]                              No   [   ]                              If No, reason:  2.Ace Inhibitor/ARB: Yes [ x  ]                                     No  [    ]                                     If No, reason:  3.Statin:   Yes [ x  ]                  No  [   ]  If No, reason:  4.Ecasa:  Yes  [  x ]                  No   [   ]                  If No, reason:    Signed: Leary Roca, PA-C 09/06/2019, 10:24 AM

## 2019-09-03 NOTE — Progress Notes (Signed)
Inpatient Rehabilitation-Admissions Coordinator   Please see note from PM&R PA Dan Angiulli. Due to current functional level, this pt does not require an IP Rehab stay. AC will sign off and will notify [facility] team.   Cheri Rous, OTR/L  Rehab Admissions Coordinator  [phone] 09/03/2019 9:49 AM

## 2019-09-03 NOTE — Progress Notes (Signed)
CARDIAC REHAB PHASE I   PRE:  Rate/Rhythm: 83 SR    BP: sitting 142/78    SaO2: 99 5L  MODE:  Ambulation: 80 ft   POST:  Rate/Rhythm: 92 SR    BP: sitting 139/92     SaO2: would not register in hall on 4 and 6L, 97 10L on return  Pt sleepy but wanting to walk on my arrival. Falling asleep quickly when I went out of room. Stood independently and used RW. Attempted 4L but when I had pt rest after 40 ft to check SaO2, he continued to be groggy and SaO2 would not register. Increased to 6L but still not registering. Pt began to c/o dizziness and needed to return to room. Increased O2 to 10L and pt was able to walk back to room, now good pleth while walking. To bed for nap, RN with pt.  [id number]  Harriet Masson CES, ACSM 09/03/2019 2:16 PM

## 2019-09-03 NOTE — Progress Notes (Signed)
4 Days Post-Op Procedure(s) (LRB): CORONARY ARTERY BYPASS GRAFTING (CABG) using LIMA to LAD; RIMA to PDL; endoscopic right greater saphenous vein harvest to Diag1; and left radial harvest sequenced to OM1 and OM2. (N/A) RADIAL ARTERY HARVEST (Left) INDOCYANINE GREEN FLUORESCENCE IMAGING (ICG) (N/A) TRANSESOPHAGEAL ECHOCARDIOGRAM (TEE) (N/A) Endovein Harvest Of Greater Saphenous Vein (Right) Subjective: Awake and alert. Pain controlled, says he wants to avoid narcotic analgesics at this point. Walked in the hall yesterday.   Objective: Vital signs in last 24 hours: Temp:  [97.7 F (36.5 C)-98.4 F (36.9 C)] 97.8 F (36.6 C) (02/08 0334) Pulse Rate:  [67-119] 85 (02/08 0700) Cardiac Rhythm: Normal sinus rhythm (02/07 2330) Resp:  [0-29] 8 (02/08 0715) BP: (64-183)/(43-153) 97/57 (02/08 0715) SpO2:  [67 %-100 %] 91 % (02/08 0700) Arterial Line BP: (69-144)/(43-95) 111/59 (02/07 1515) Weight:  [116.3 kg] 116.3 kg (02/08 0500)     Intake/Output from previous day: 02/07 0701 - 02/08 0700 In: 472.5 [P.O.:420; I.V.:52.5] Out: 1245 [Urine:1245] Intake/Output this shift: No intake/output data recorded.  General appearance: alert, cooperative and no distress Neurologic: intact Heart: regular rate and rhythm Lungs: Breath sounds are clear. Abdomen: soft and non-tender. Extremities: All well perfused, mild LE edema.  Wound: The sternal incision is covered with a dry dressing. The left arm and RE incisions are dry.   Lab Results: Recent Labs    09/02/19 0500 09/03/19 0559  WBC 10.9* 8.5  HGB 8.7* 8.4*  HCT 27.4* 26.7*  PLT 120* 136*   BMET:  Recent Labs    09/02/19 0500 09/03/19 0559  NA 137 138  K 4.3 4.1  CL 104 104  CO2 25 25  GLUCOSE 116* 121*  BUN 12 23*  CREATININE 0.88 1.20  CALCIUM 8.5* 8.8*    PT/INR: No results for input(s): LABPROT, INR in the last 72 hours. ABG    Component Value Date/Time   PHART 7.375 09/01/2019 0506   HCO3 23.4 09/01/2019 0506   TCO2 25 09/01/2019 0506   ACIDBASEDEF 2.0 09/01/2019 0506   O2SAT 94.0 09/01/2019 0506   CBG (last 3)  Recent Labs    09/02/19 2133 09/02/19 2334 09/03/19 0333  GLUCAP 140* 122* 113*    Assessment/Plan: S/P Procedure(s) (LRB): CORONARY ARTERY BYPASS GRAFTING (CABG) using LIMA to LAD; RIMA to PDL; endoscopic right greater saphenous vein harvest to Diag1; and left radial harvest sequenced to OM1 and OM2. (N/A) RADIAL ARTERY HARVEST (Left) INDOCYANINE GREEN FLUORESCENCE IMAGING (ICG) (N/A) TRANSESOPHAGEAL ECHOCARDIOGRAM (TEE) (N/A) Endovein Harvest Of Greater Saphenous Vein (Right)  -POD-5 CABG x 4 for MVCAD presenting with unstable angina. Pre-op EF 60-65% by ECHO.  Hemodynamically stable off all vasopressors. Now on midodrine 5mg  po TID. Continue ASA, atorvastatin.  Beta blocker not yet started due to soft BP.  On Imdur for radial arterial graft.   -Pulm- O2 at 10L, sat 93%  -Volume Excess- Wt 3kg above pre-op. Gently diuresing as BP allows.   -Expected acute blood loss anemia- Transfused early post-op. Hct stable.   -Type 2 DM- Glucos well controlled on Levemir and SSI.  -Disposition- plan transfer to 4E today. His functional level is too high to qualify for CIR.  He lives alone and would benefit from short stay in a SNF or other rehab facility. Request [facility]  Team evaluation.     LOS: 7 days    [facility], Leary Roca [place] 09/03/2019

## 2019-09-03 NOTE — Discharge Instructions (Signed)

## 2019-09-03 NOTE — Evaluation (Signed)
Occupational Therapy Evaluation Patient Details Name: Jacob Brady MRN: [id number] DOB: 15-Dec-1960 Today's Date: 09/03/2019    History of Present Illness Pt is a 59 yo male with poorly controlled insulin-dependent DM, HTN, HLD, major depressive disorder, PTSD, and pancreatitis who was seen for the evaluation of chest pain. Upon admission, pt dx with unstable angina and reduced EF (45-54%). He underwent CABG 2/4.   Clinical Impression   PTA, pt was living alone and was independent. Pt currently requiring Min Guard-Min A for UB ADLs, Min Guard A for LB ADLs, and Min Guard A for functional mobility. Pt presenting with decreased balance, strength, and activity tolerance. Providing education on sternal precautions and compensatory techniques for UB/LB dressing. VSS on 8-10L O2Pt will need further acute OT to provide further education on safe performance of ADLs. Recommend dc to home once medically stable per phsyician. Pt reporting he will need increased support at home as he does not have any support at dc.     Follow Up Recommendations  No OT follow up;Supervision/Assistance - 24 hour    Equipment Recommendations  Tub/shower seat    Recommendations for Other Services PT consult     Precautions / Restrictions Precautions Precautions: Fall Precaution Comments: Sternal precautions Restrictions Other Position/Activity Restrictions: Sternal precautions      Mobility Bed Mobility               General bed mobility comments: Standing in room with RN in room  Transfers Overall transfer level: Needs assistance Equipment used: Ambulation equipment used Transfers: Sit to/from Stand Sit to Stand: Min guard         General transfer comment: Min Guard A for safety    Balance Overall balance assessment: Needs assistance Sitting-balance support: No upper extremity supported;Feet supported Sitting balance-Leahy Scale: Good Sitting balance - Comments: independent sitting EOB  once positioned due to lines   Standing balance support: No upper extremity supported;During functional activity Standing balance-Leahy Scale: Fair Standing balance comment: static stand without UE support.                           ADL either performed or assessed with clinical judgement   ADL Overall ADL's : Needs assistance/impaired Eating/Feeding: Set up;Sitting   Grooming: Supervision/safety;Standing   Upper Body Bathing: Min guard;Sitting Upper Body Bathing Details (indicate cue type and reason): Requiring education on compensatory techniques for UB ADLs Lower Body Bathing: Min guard;Sit to/from stand   Upper Body Dressing : Sitting;Minimal assistance Upper Body Dressing Details (indicate cue type and reason): Educating pt on compensatory tehcniques for donning shirt. Pt requiring Min A for bringing over shoulder Lower Body Dressing: Min guard;Sit to/from stand Lower Body Dressing Details (indicate cue type and reason): Pt donning pants with Min Guard A for Min cues for safety and adherance to precautions Toilet Transfer: Min guard;Ambulation           Functional mobility during ADLs: Min guard General ADL Comments: Providing education on sternal precautions and compensatory techniques for UB ADLs and LB ADLs.      Vision Baseline Vision/History: Wears glasses Wears Glasses: At all times Patient Visual Report: No change from baseline       Perception     Praxis      Pertinent Vitals/Pain Pain Assessment: Faces Faces Pain Scale: Hurts little more Pain Location: sx incision site, foley catheter site Pain Descriptors / Indicators: Grimacing;Guarding;Discomfort Pain Intervention(s): Monitored during session;Repositioned     Hand  Dominance Right   Extremity/Trunk Assessment Upper Extremity Assessment Upper Extremity Assessment: Overall WFL for tasks assessed   Lower Extremity Assessment Lower Extremity Assessment: Overall WFL for tasks assessed    Cervical / Trunk Assessment Cervical / Trunk Assessment: Normal   Communication Communication Communication: No difficulties   Cognition Arousal/Alertness: Awake/alert Behavior During Therapy: WFL for tasks assessed/performed;Flat affect Overall Cognitive Status: Within Functional Limits for tasks assessed                                     General Comments  SpO2 stable on 10L O2.     Exercises     Shoulder Instructions      Home Living Family/patient expects to be discharged to:: Private residence Living Arrangements: Alone Available Help at Discharge: Other (Comment)(pt reports he has no one who can assist) Type of Home: Apartment(second floor) Home Access: Stairs to enter Entrance Stairs-Number of Steps: 15   Home Layout: One level     Bathroom Shower/Tub: Chief Strategy Officer: Standard     Home Equipment: None          Prior Functioning/Environment Level of Independence: Independent        Comments: pt reports ambulating 1.5 miles to grocery store 2x/wk without AD, able to carry groceries home. Reports he gets OOB with carrying groceries up stairs, but no LOB. Pt reports he does not drive, has been relying on transportation from [place] to attend appointments, but this was canceled with covid.        OT Problem List: Decreased strength;Impaired balance (sitting and/or standing);Decreased activity tolerance;Decreased knowledge of use of DME or AE;Decreased knowledge of precautions;Cardiopulmonary status limiting activity      OT Treatment/Interventions: Self-care/ADL training;Therapeutic exercise;Energy conservation;DME and/or AE instruction;Therapeutic activities;Patient/family education    OT Goals(Current goals can be found in the care plan section) Acute Rehab OT Goals Patient Stated Goal: "Get some support" OT Goal Formulation: With patient Time For Goal Achievement: 09/17/19 Potential to Achieve Goals: Good  OT Frequency:  Min 3X/week   Barriers to D/C:            Co-evaluation              AM-PAC OT "6 Clicks" Daily Activity     Outcome Measure Help from another person eating meals?: None Help from another person taking care of personal grooming?: A Little Help from another person toileting, which includes using toliet, bedpan, or urinal?: A Little Help from another person bathing (including washing, rinsing, drying)?: A Little Help from another person to put on and taking off regular upper body clothing?: A Little Help from another person to put on and taking off regular lower body clothing?: A Little 6 Click Score: 19   End of Session Equipment Utilized During Treatment: Oxygen(10L) Nurse Communication: Mobility status  Activity Tolerance: Patient tolerated treatment well Patient left: in chair;with call bell/phone within reach  OT Visit Diagnosis: Unsteadiness on feet (R26.81);Other abnormalities of gait and mobility (R26.89);Muscle weakness (generalized) (M62.81)                Time: 9562-1308 OT Time Calculation (min): 29 min Charges:  OT General Charges $OT Visit: 1 Visit OT Evaluation $OT Eval Moderate Complexity: 1 Mod OT Treatments $Self Care/Home Management : 8-22 mins  Liller Yohn MSOT, OTR/L Acute Rehab Pager: [phone] Office: [phone]  Theodoro Grist Dmarcus Decicco 09/03/2019, 1:45 PM

## 2019-09-03 NOTE — Progress Notes (Signed)
Physical medicine rehab consult requested chart reviewed.  Patient currently ambulating 1500 feet minimal guard bilateral platform rolling walker with recommendations of cardiac rehab outpatient.  Patient will not need inpatient rehab services at this time.

## 2019-09-03 NOTE — Plan of Care (Signed)

## 2019-09-04 ENCOUNTER — Inpatient Hospital Stay (HOSPITAL_COMMUNITY): Payer: No Typology Code available for payment source

## 2019-09-04 LAB — BASIC METABOLIC PANEL
Anion gap: 11 (ref 5–15)
BUN: 19 mg/dL (ref 6–20)
CO2: 26 mmol/L (ref 22–32)
Calcium: 8.6 mg/dL — ABNORMAL LOW (ref 8.9–10.3)
Chloride: 101 mmol/L (ref 98–111)
Creatinine, Ser: 0.72 mg/dL (ref 0.61–1.24)
GFR calc Af Amer: >60 mL/min (ref >60)
GFR calc non Af Amer: >60 mL/min (ref >60)
Glucose, Bld: 104 mg/dL — ABNORMAL HIGH (ref 70–99)
Potassium: 3.9 mmol/L (ref 3.5–5.1)
Sodium: 138 mmol/L (ref 135–145)

## 2019-09-04 LAB — CBC
HCT: 24.3 % — ABNORMAL LOW (ref 39.0–52.0)
Hemoglobin: 7.7 g/dL — ABNORMAL LOW (ref 13.0–17.0)
MCH: 30.6 pg (ref 26.0–34.0)
MCHC: 31.7 g/dL (ref 30.0–36.0)
MCV: 96.4 fL (ref 80.0–100.0)
Platelets: 155 10*3/uL (ref 150–400)
RBC: 2.52 MIL/uL — ABNORMAL LOW (ref 4.22–5.81)
RDW: 14.8 % (ref 11.5–15.5)
WBC: 7.5 10*3/uL (ref 4.0–10.5)
nRBC: 0 % (ref 0.0–0.2)

## 2019-09-04 LAB — GLUCOSE, CAPILLARY
Glucose-Capillary: 95 mg/dL (ref 70–99)
Glucose-Capillary: 127 mg/dL — ABNORMAL HIGH (ref 70–99)
Glucose-Capillary: 108 mg/dL — ABNORMAL HIGH (ref 70–99)
Glucose-Capillary: 148 mg/dL — ABNORMAL HIGH (ref 70–99)

## 2019-09-04 MED ORDER — MIDODRINE HCL 5 MG PO TABS
2.5000 mg | ORAL_TABLET | Freq: Three times a day (TID) | ORAL | Status: DC
  Administered 2019-09-04 (×3): 2.5 mg via ORAL
  Filled 2019-09-04 (×3): qty 1

## 2019-09-04 MED ORDER — OXYCODONE HCL 5 MG PO TABS
5.0000 mg | ORAL_TABLET | Freq: Four times a day (QID) | ORAL | Status: DC | PRN
  Administered 2019-09-05 – 2019-09-06 (×4): 5 mg via ORAL
  Filled 2019-09-04 (×4): qty 1

## 2019-09-04 MED ORDER — FUROSEMIDE 10 MG/ML IJ SOLN
40.0000 mg | Freq: Two times a day (BID) | INTRAMUSCULAR | Status: DC
  Administered 2019-09-04 – 2019-09-05 (×3): 40 mg via INTRAVENOUS
  Filled 2019-09-04 (×3): qty 4

## 2019-09-04 MED ORDER — CARVEDILOL 6.25 MG PO TABS
6.2500 mg | ORAL_TABLET | Freq: Two times a day (BID) | ORAL | Status: DC
  Administered 2019-09-04 – 2019-09-05 (×2): 6.25 mg via ORAL
  Filled 2019-09-04 (×2): qty 1

## 2019-09-04 MED ORDER — TAB-A-VITE/IRON PO TABS
1.0000 | ORAL_TABLET | Freq: Every day | ORAL | Status: DC
  Administered 2019-09-04 – 2019-09-06 (×3): 1 via ORAL
  Filled 2019-09-04 (×3): qty 1

## 2019-09-04 MED ORDER — CARVEDILOL 3.125 MG PO TABS: 3.1250 mg | ORAL_TABLET | Freq: Two times a day (BID) | ORAL | Status: DC

## 2019-09-04 MED FILL — Magnesium Sulfate Inj 50%: INTRAMUSCULAR | Qty: 10 | Status: AC

## 2019-09-04 MED FILL — Heparin Sodium (Porcine) Inj 1000 Unit/ML: INTRAMUSCULAR | Qty: 30 | Status: AC

## 2019-09-04 MED FILL — Potassium Chloride Inj 2 mEq/ML: INTRAVENOUS | Qty: 40 | Status: AC

## 2019-09-04 NOTE — Progress Notes (Signed)
[address]       [place] [address]             [phone]      5 Days Post-Op Procedure(s) (LRB): CORONARY ARTERY BYPASS GRAFTING (CABG) using LIMA to LAD; RIMA to PDL; endoscopic right greater saphenous vein harvest to Diag1; and left radial harvest sequenced to OM1 and OM2. (N/A) RADIAL ARTERY HARVEST (Left) INDOCYANINE GREEN FLUORESCENCE IMAGING (ICG) (N/A) TRANSESOPHAGEAL ECHOCARDIOGRAM (TEE) (N/A) Endovein Harvest Of Greater Saphenous Vein (Right) Subjective: Feels okay this morning. Sleeping when I entered the room. No complaints.   Objective: Vital signs in last 24 hours: Temp:  [97.2 F (36.2 C)-98.8 F (37.1 C)] 98.4 F (36.9 C) (02/09 0321) Pulse Rate:  [76-171] 102 (02/09 0321) Cardiac Rhythm: Normal sinus rhythm (02/08 1900) Resp:  [12-20] 20 (02/09 0321) BP: (82-150)/(52-85) 117/70 (02/09 0321) SpO2:  [73 %-97 %] 96 % (02/08 1943) Weight:  [114.8 kg] 114.8 kg (02/09 0321)     Intake/Output from previous day: 02/08 0701 - 02/09 0700 In: 720 [P.O.:720] Out: 1562 [Urine:1562] Intake/Output this shift: No intake/output data recorded.  General appearance: alert, cooperative and no distress Heart: regular rate and rhythm, S1, S2 normal, no murmur, click, rub or gallop Lungs: clear to auscultation bilaterally Abdomen: soft, non-tender; bowel sounds normal; no masses,  no organomegaly Extremities: ecchymosis on the right inner thigh where the vein was harvested.  Wound: clean and dry sternotomy incision with staples.   Lab Results: Recent Labs    09/03/19 0559 09/04/19 0350  WBC 8.5 7.5  HGB 8.4* 7.7*  HCT 26.7* 24.3*  PLT 136* 155   BMET:  Recent Labs    09/03/19 0559 09/04/19 0350  NA 138 138  K 4.1 3.9  CL 104 101  CO2 25 26  GLUCOSE 121* 104*  BUN 23* 19  CREATININE 1.20 0.72  CALCIUM 8.8* 8.6*    PT/INR: No results for input(s): LABPROT, INR in the last 72 hours. ABG    Component Value Date/Time   PHART  7.375 09/01/2019 0506   HCO3 23.4 09/01/2019 0506   TCO2 25 09/01/2019 0506   ACIDBASEDEF 2.0 09/01/2019 0506   O2SAT 94.0 09/01/2019 0506   CBG (last 3)  Recent Labs    09/03/19 1657 09/03/19 2118 09/04/19 0653  GLUCAP 121* 119* 95    Assessment/Plan: S/P Procedure(s) (LRB): CORONARY ARTERY BYPASS GRAFTING (CABG) using LIMA to LAD; RIMA to PDL; endoscopic right greater saphenous vein harvest to Diag1; and left radial harvest sequenced to OM1 and OM2. (N/A) RADIAL ARTERY HARVEST (Left) INDOCYANINE GREEN FLUORESCENCE IMAGING (ICG) (N/A) TRANSESOPHAGEAL ECHOCARDIOGRAM (TEE) (N/A) Endovein Harvest Of Greater Saphenous Vein (Right)  1. CV- some elevated Bps. Will decrease Midodrine to 2.5TID. Continue ASA, statin, and BB.  2. Pulm-Remains on 4L of [place]. Continue to use incentive spirometer. CXR stable.  3. Creatinine 0.72, electrolytes okay.  4. H and H decreased to 7.7/24.3, no overt bleeding. Will continue to trend. H and H in the morning. 5. Endo-Type 2 DM, continue SSI and Levemir, well controlled 6. GI-still not much of an appetite but no n/v 7. Lives alone and needs to walk a mile to the store for groceries and carry them home. Discussed a 10lb weight limit after discharge. He will need a facility in the interim-case management consulted.  Plan: continue to wean oxygen as able. Ambulation TID. Continue to work on discharge plan. Weaning midodrine.     LOS: 8 days  Elgie Collard 09/04/2019

## 2019-09-04 NOTE — Progress Notes (Signed)
CARDIAC REHAB PHASE I   PRE:  Rate/Rhythm: 95 SR    BP: sitting 121/97    SaO2: 94 4L HFNC  MODE:  Ambulation: 300 ft   POST:  Rate/Rhythm: 108 ST    BP: sitting 133/98     SaO2: 100 8L HFNC   Pt ambulated with RW, assist x2 and O2 with HFNC. Began on 6L however after 150 ft SAO2 87 6L (although slow/difficult to register) and pt feeling SOB and dizzy with flat affect. Increased to 8L. Continued ambulating with groggy look, SAO2 registering higher. 100 8L after walk. To bed, pt sleepy. Encouraged IS and more ambulation. Will f/u as x2 for safety. He moves well but has been groggy. [phone]  Jacob Brady CES, ACSM 09/04/2019 11:29 AM

## 2019-09-04 NOTE — Progress Notes (Signed)
Physical Therapy Treatment Patient Details Name: Jacob Brady MRN: [id number] DOB: Jul 27, 1960 Today's Date: 09/04/2019    History of Present Illness Pt is a 59 yo male with poorly controlled insulin-dependent DM, HTN, HLD, major depressive disorder, PTSD, and pancreatitis who was seen for the evaluation of chest pain. Upon admission, pt dx with unstable angina and reduced EF (45-54%). He underwent CABG 2/4.    PT Comments    Patient progressing well towards PT goals. Tolerated gait training with use of RW and min guard assist for safety. Sp02 dropped to 80s on 3L/min 02 [place], needed 6 L to maintain Sp02 >90% with activity. Pt not the best at self monitoring symptoms and needed a forced seated rest break due to dizziness. Concerned about going home without support as pt has to walk to the grocery store 1.5 miles each way and carry a backpack home. Pt has no family/friend support. Will consult SW to assist with any programs that may be able to assist this pt. Also recommend rollator for safety with ambulation. Able to state sternal precautions. Next session will focus on bed mobility and stair training as pt needs to be able to negotiate steps to enter apt. Will follow.   Follow Up Recommendations  Home health PT;Supervision for mobility/OOB     Equipment Recommendations  Other (comment)(4 wheeled walker (rollator))    Recommendations for Other Services       Precautions / Restrictions Precautions Precautions: Fall Precaution Booklet Issued: No Precaution Comments: Sternal precautions, watch 02 Restrictions Weight Bearing Restrictions: Yes Other Position/Activity Restrictions: Sternal precautions    Mobility  Bed Mobility               General bed mobility comments: Up in chair upon PT arrival.  Transfers Overall transfer level: Needs assistance Equipment used: Rolling walker (2 wheeled) Transfers: Sit to/from Stand Sit to Stand: Min guard         General  transfer comment: Min guard for safety. Stood from chair x2; cues to not use UEs to stand.  Ambulation/Gait Ambulation/Gait assistance: Min guard Gait Distance (Feet): 300 Feet(+ 200') Assistive device: Rolling walker (2 wheeled) Gait Pattern/deviations: Step-through pattern;Decreased stride length   Gait velocity interpretation: 1.31 - 2.62 ft/sec, indicative of limited community ambulator General Gait Details: Slow, mostly steady gait with use of RW for support; Sp02 dropped to 80s on 3L/min 02 , needed 6L to maintain Sp02 >90%, HR 90-107 bpm. + dizziness requiring 1 seated rest break.   Stairs             Wheelchair Mobility    Modified Rankin (Stroke Patients Only)       Balance Overall balance assessment: Needs assistance Sitting-balance support: No upper extremity supported;Feet supported Sitting balance-Leahy Scale: Good     Standing balance support: During functional activity Standing balance-Leahy Scale: Fair Standing balance comment: static stand without UE support.                            Cognition Arousal/Alertness: Awake/alert Behavior During Therapy: WFL for tasks assessed/performed Overall Cognitive Status: Within Functional Limits for tasks assessed                                 General Comments: Worried about going home and not having enough support.      Exercises      General Comments General comments (  skin integrity, edema, etc.): Sp02 dropped to 80s on 3L/min 02 [place], needed 6L to maintain Sp02 >90% with activity. HR ranged from 90-107 bpm. pre activity BP 123/86, post activity BP 131/83.      Pertinent Vitals/Pain Pain Assessment: Faces Faces Pain Scale: Hurts a little bit Pain Location: chest incision Pain Descriptors / Indicators: Sore Pain Intervention(s): Repositioned;Monitored during session    Home Living                      Prior Function            PT Goals (current goals can now  be found in the care plan section) Progress towards PT goals: Progressing toward goals    Frequency    Min 3X/week      PT Plan Discharge plan needs to be updated    Co-evaluation              AM-PAC PT "6 Clicks" Mobility   Outcome Measure  Help needed turning from your back to your side while in a flat bed without using bedrails?: None Help needed moving from lying on your back to sitting on the side of a flat bed without using bedrails?: A Little Help needed moving to and from a bed to a chair (including a wheelchair)?: A Little Help needed standing up from a chair using your arms (e.g., wheelchair or bedside chair)?: A Little Help needed to walk in hospital room?: A Little Help needed climbing 3-5 steps with a railing? : A Little 6 Click Score: 19    End of Session Equipment Utilized During Treatment: Gait belt;Oxygen Activity Tolerance: Treatment limited secondary to medical complications (Comment);Patient tolerated treatment well(drop in Sp02) Patient left: in chair;with call bell/phone within reach Nurse Communication: Mobility status;Other (comment)(weaning 02) PT Visit Diagnosis: Difficulty in walking, not elsewhere classified (R26.2);History of falling (Z91.81);Pain Pain - part of body: (incision)     Time: 1751-0258 PT Time Calculation (min) (ACUTE ONLY): 28 min  Charges:  $Gait Training: 8-22 mins $Therapeutic Activity: 8-22 mins                     Marisa Severin, PT, DPT Acute Rehabilitation Services Pager [phone] Office [place] 09/04/2019, 12:25 PM

## 2019-09-05 LAB — BASIC METABOLIC PANEL
Anion gap: 9 (ref 5–15)
BUN: 12 mg/dL (ref 6–20)
CO2: 27 mmol/L (ref 22–32)
Calcium: 8.6 mg/dL — ABNORMAL LOW (ref 8.9–10.3)
Chloride: 102 mmol/L (ref 98–111)
Creatinine, Ser: 0.77 mg/dL (ref 0.61–1.24)
GFR calc Af Amer: >60 mL/min (ref >60)
GFR calc non Af Amer: >60 mL/min (ref >60)
Glucose, Bld: 131 mg/dL — ABNORMAL HIGH (ref 70–99)
Potassium: 4.2 mmol/L (ref 3.5–5.1)
Sodium: 138 mmol/L (ref 135–145)

## 2019-09-05 LAB — HEMOGLOBIN AND HEMATOCRIT, BLOOD
HCT: 25.5 % — ABNORMAL LOW (ref 39.0–52.0)
Hemoglobin: 7.9 g/dL — ABNORMAL LOW (ref 13.0–17.0)

## 2019-09-05 LAB — GLUCOSE, CAPILLARY
Glucose-Capillary: 105 mg/dL — ABNORMAL HIGH (ref 70–99)
Glucose-Capillary: 143 mg/dL — ABNORMAL HIGH (ref 70–99)
Glucose-Capillary: 138 mg/dL — ABNORMAL HIGH (ref 70–99)
Glucose-Capillary: 136 mg/dL — ABNORMAL HIGH (ref 70–99)

## 2019-09-05 MED ORDER — FUROSEMIDE 40 MG PO TABS
40.0000 mg | ORAL_TABLET | Freq: Two times a day (BID) | ORAL | Status: DC
  Administered 2019-09-05 – 2019-09-06 (×2): 40 mg via ORAL
  Filled 2019-09-05 (×2): qty 1

## 2019-09-05 MED ORDER — METOPROLOL TARTRATE 25 MG PO TABS
25.0000 mg | ORAL_TABLET | Freq: Two times a day (BID) | ORAL | Status: DC
  Administered 2019-09-05 – 2019-09-06 (×2): 25 mg via ORAL
  Filled 2019-09-05 (×2): qty 1

## 2019-09-05 MED ORDER — POLYETHYLENE GLYCOL 3350 17 G PO PACK
17.0000 g | PACK | Freq: Once | ORAL | Status: DC
  Filled 2019-09-05 (×2): qty 1

## 2019-09-05 MED ORDER — LEVALBUTEROL HCL 1.25 MG/0.5ML IN NEBU
1.2500 mg | INHALATION_SOLUTION | Freq: Four times a day (QID) | RESPIRATORY_TRACT | Status: DC | PRN
  Administered 2019-09-05: 05:00:00 1.25 mg via RESPIRATORY_TRACT
  Filled 2019-09-05: qty 0.5

## 2019-09-05 MED FILL — Heparin Sodium (Porcine) Inj 1000 Unit/ML: INTRAMUSCULAR | Qty: 20 | Status: AC

## 2019-09-05 MED FILL — Mannitol IV Soln 20%: INTRAVENOUS | Qty: 500 | Status: AC

## 2019-09-05 MED FILL — Sodium Bicarbonate IV Soln 8.4%: INTRAVENOUS | Qty: 50 | Status: AC

## 2019-09-05 MED FILL — Electrolyte-R (PH 7.4) Solution: INTRAVENOUS | Qty: 4000 | Status: AC

## 2019-09-05 MED FILL — Albumin, Human Inj 5%: INTRAVENOUS | Qty: 250 | Status: AC

## 2019-09-05 MED FILL — Sodium Chloride IV Soln 0.9%: INTRAVENOUS | Qty: 3000 | Status: AC

## 2019-09-05 NOTE — Progress Notes (Signed)
CARDIAC REHAB PHASE I   PRE:  Rate/Rhythm: 88 SR    BP: sitting 153/91    SaO2: 96 RA  MODE:  Ambulation: 300 ft   POST:  Rate/Rhythm: 97 SR    BP: sitting 103/91     SaO2: 90 2L, 97 2L at rest  Pt in bed, 96 RA with good pleth. Used 2L for walk. Assist x2 with RW, 2L. Slow, steady pace. SaO2 89-92 2L. Rest x1 for dizziness and slightly SOB. To recliner after walk. Notified pt he could have trials of RA but right now he wants to wear 2L until he recovers from walk. Encouraged IS. [phone]   Harriet Masson CES, ACSM 09/05/2019 12:47 PM

## 2019-09-05 NOTE — Progress Notes (Addendum)
6 Days Post-Op Procedure(s) (LRB): CORONARY ARTERY BYPASS GRAFTING (CABG) using LIMA to LAD; RIMA to PDL; endoscopic right greater saphenous vein harvest to Diag1; and left radial harvest sequenced to OM1 and OM2. (N/A) RADIAL ARTERY HARVEST (Left) INDOCYANINE GREEN FLUORESCENCE IMAGING (ICG) (N/A) TRANSESOPHAGEAL ECHOCARDIOGRAM (TEE) (N/A) Endovein Harvest Of Greater Saphenous Vein (Right) Subjective: Resting in bed now. Had some shortness of breath earlier this morning and FiO2 increased from 3Lnc to venturi mask (?40%). Feels better now, O2 sat 96-100%.  Doing well with mobility, no BM yet.   Objective: Vital signs in last 24 hours: Temp:  [97.7 F (36.5 C)-98.9 F (37.2 C)] 97.8 F (36.6 C) (02/10 0300) Pulse Rate:  [77-94] 94 (02/10 0300) Cardiac Rhythm: Normal sinus rhythm;Bundle branch block (02/09 1900) Resp:  [20-24] 24 (02/10 0300) BP: (130-158)/(70-98) 158/80 (02/09 2346) SpO2:  [89 %-100 %] 100 % (02/10 0510) Weight:  [114.8 kg] 114.8 kg (02/10 0545)     Intake/Output from previous day: 02/09 0701 - 02/10 0700 In: 240 [P.O.:240] Out: 950 [Urine:950] Intake/Output this shift: No intake/output data recorded.  General appearance: alert, cooperative and no distress Neurologic: intact Heart: regular rate and rhythm Lungs: Breath sounds are clear anterior and posterior.  CXR shows prominent interstitial markings, left >right. No significant effusion.  Abdomen: soft and non-tender Extremities: all well perfused, expected bruising right thigh.  Wound: All incisions to chest, left arm, right thigh are intact and dry.   Lab Results: Recent Labs    09/03/19 0559 09/03/19 0559 09/04/19 0350 09/05/19 0357  WBC 8.5  --  7.5  --   HGB 8.4*   < > 7.7* 7.9*  HCT 26.7*   < > 24.3* 25.5*  PLT 136*  --  155  --    < > = values in this interval not displayed.   BMET:  Recent Labs    09/04/19 0350 09/05/19 0357  NA 138 138  K 3.9 4.2  CL 101 102  CO2 26 27  GLUCOSE  104* 131*  BUN 19 12  CREATININE 0.72 0.77  CALCIUM 8.6* 8.6*    PT/INR: No results for input(s): LABPROT, INR in the last 72 hours. ABG    Component Value Date/Time   PHART 7.375 09/01/2019 0506   HCO3 23.4 09/01/2019 0506   TCO2 25 09/01/2019 0506   ACIDBASEDEF 2.0 09/01/2019 0506   O2SAT 94.0 09/01/2019 0506   CBG (last 3)  Recent Labs    09/04/19 1624 09/04/19 2115 09/05/19 0633  GLUCAP 108* 148* 105*    EXAM: CHEST - 2 VIEW  COMPARISON:  09/02/2018  FINDINGS: Cardiac shadow is stable. Postsurgical changes are again Brady. PICC is noted in the distal superior cava stable. Stable interstitial markings are noted in the left lung peripherally when compared with the prior exam. No sizable effusion is Brady. No acute bony abnormality is noted.  IMPRESSION: No significant interval change from the prior study.   Electronically Signed   By: Alcide Clever M.D.   On: 09/04/2019 09:34   Assessment/Plan: S/P Procedure(s) (LRB): CORONARY ARTERY BYPASS GRAFTING (CABG) using LIMA to LAD; RIMA to PDL; endoscopic right greater saphenous vein harvest to Diag1; and left radial harvest sequenced to OM1 and OM2. (N/A) RADIAL ARTERY HARVEST (Left) INDOCYANINE GREEN FLUORESCENCE IMAGING (ICG) (N/A) TRANSESOPHAGEAL ECHOCARDIOGRAM (TEE) (N/A) Endovein Harvest Of Greater Saphenous Vein (Right)   -POD-7 CABG x 4 for MVCAD presenting with unstable angina. Pre-op EF 60-65% by ECHO.  Hemodynamically stable off all vasopressors. Now on  midodrine 2.5mg  po TID, BP 130's- 150's so will discontinue today. Continue ASA, atorvastatin, Coreg.   -Pulm- O2 at 10L, sat 93%  -Volume Excess- Wt only 2kg above pre-op but lungs still have significant interstitial densities. . Continue diuresis  -Expected acute blood loss anemia- Transfused early post-op. Hct acceptable, transfusion not indicated now.  Monitor.   -Type 2 DM- Glucose well controlled on Levemir and SSI.  -Disposition-   His functional level is too high to qualify for CIR.  He lives alone and would benefit from short stay in a SNF or other rehab facility. Requested [facility]  Team evaluation.      LOS: 9 days    Jacob Brady, [place] [phone] 09/05/2019  Pt Brady and examined; agree with note. Making good progress, needing more diuresis. Jacob Brady, [place]

## 2019-09-05 NOTE — Progress Notes (Addendum)
Occupational Therapy Treatment Patient Details Name: Jacob Brady MRN: [id number] DOB: [birth year] Today's Date: 09/05/2019    History of present illness Pt is a 59 yo male with poorly controlled insulin-dependent DM, HTN, HLD, major depressive disorder, PTSD, and pancreatitis who was seen for the evaluation of chest pain. Upon admission, pt dx with unstable angina and reduced EF (45-54%). He underwent CABG 2/4.   OT comments  Patient progressing towards OT goals.  Reviewed sternal precautions and ADL compensatory techniques to optimize independence and pain mgmt. Initiated education on energy conservation techniques, IADL mgmt with cooking (position of items in kitchen, sliding pots/pans versus picking up, etc).  Patient completing LB ADLs with supervision, transfers with supervision, and simulated tub transfers with min guard assist (but difficulty noted raising L LE over threshold height of walk in shower only).  VSS during session on 4L via venturi mask. Discussed options of assist for IADLs, may benefit from [facility] aide but patient reports through [place] he should be able to get the support he needs for grocery shopping; pt frustrated and eager to figure out a plan- will reach out to SW team.  Will follow acutely.    Follow Up Recommendations  No OT follow up;Supervision/Assistance - 24 hour;Other (comment)(maybe HH aide support for IADLS? )    Equipment Recommendations  Tub/shower seat    Recommendations for Other Services      Precautions / Restrictions Precautions Precautions: Fall Precaution Booklet Issued: No Precaution Comments: Sternal precautions, watch 02 Restrictions Weight Bearing Restrictions: Yes Other Position/Activity Restrictions: Sternal precautions       Mobility Bed Mobility Overal bed mobility: Needs Assistance Bed Mobility: Sit to Supine       Sit to supine: Supervision   General bed mobility comments: line mgmt   Transfers Overall transfer level:  Needs assistance Equipment used: Rolling walker (2 wheeled);None Transfers: Sit to/from Stand Sit to Stand: Supervision         General transfer comment: supervision for safety, line mgmt; pt following precautions and standing without using UEs     Balance Overall balance assessment: Needs assistance Sitting-balance support: No upper extremity supported;Feet supported Sitting balance-Leahy Scale: Good     Standing balance support: No upper extremity supported;During functional activity Standing balance-Leahy Scale: Fair Standing balance comment: supervision for safety                            ADL either performed or assessed with clinical judgement   ADL Overall ADL's : Needs assistance/impaired     Grooming: Supervision/safety;Standing             Upper Body Dressing Details (indicate cue type and reason): verbally reviewed technique for donning pullover shirt, discussed compensatory techinques; pt declined to practice at this time (reports he already has)  Lower Body Dressing: Supervision/safety;Sit to/from stand Lower Body Dressing Details (indicate cue type and reason): pt able to simulate sock mgmt with supervision seated, supervision sit to stand  Toilet Transfer: Supervision/safety;Ambulation Toilet Transfer Details (indicate cue type and reason): for line mgmt    Toileting - Clothing Manipulation Details (indicate cue type and reason): discussed techniques for hygiene and sternal precuations  Tub/ Shower Transfer: Min guard;Ambulation Tub/Shower Transfer Details (indicate cue type and reason): simulated tub transfers, difficulty brining L LE over threshold; dicussed technique for UE support and use of shower chair in tub  Functional mobility during ADLs: Supervision/safety General ADL Comments: further reviewed precautions, compensatory techniques, recommendations,  energy conservation and IADL mgmt      Vision       Perception     Praxis       Cognition Arousal/Alertness: Awake/alert Behavior During Therapy: WFL for tasks assessed/performed Overall Cognitive Status: Within Functional Limits for tasks assessed                                 General Comments: frustrated about not hearing back from CM, he would is interested in increased assist at home from [place] for iADLs (grocery shopping)         Exercises     Shoulder Instructions       General Comments Pt on 4L via venturi mask; SpO2 maintained and VSS     Pertinent Vitals/ Pain       Pain Assessment: Faces Faces Pain Scale: Hurts little more Pain Location: chest incision Pain Descriptors / Indicators: Sore Pain Intervention(s): Repositioned;Limited activity within patient's tolerance;Monitored during session  Home Living                                          Prior Functioning/Environment              Frequency  Min 3X/week        Progress Toward Goals  OT Goals(current goals can now be found in the care plan section)  Progress towards OT goals: Progressing toward goals  Acute Rehab OT Goals Patient Stated Goal: "Get some support" OT Goal Formulation: With patient  Plan Discharge plan remains appropriate;Frequency remains appropriate    Co-evaluation                 AM-PAC OT "6 Clicks" Daily Activity     Outcome Measure   Help from another person eating meals?: None Help from another person taking care of personal grooming?: A Little Help from another person toileting, which includes using toliet, bedpan, or urinal?: A Little Help from another person bathing (including washing, rinsing, drying)?: A Little Help from another person to put on and taking off regular upper body clothing?: A Little Help from another person to put on and taking off regular lower body clothing?: A Little 6 Click Score: 19    End of Session Equipment Utilized During Treatment: Oxygen(4L)  OT Visit Diagnosis:  Unsteadiness on feet (R26.81);Other abnormalities of gait and mobility (R26.89);Muscle weakness (generalized) (M62.81)   Activity Tolerance Patient tolerated treatment well   Patient Left in bed;with call bell/phone within reach   Nurse Communication Mobility status        Time: 3875-6433 OT Time Calculation (min): 26 min  Charges: OT General Charges $OT Visit: 1 Visit OT Treatments $Self Care/Home Management : 23-37 mins  Barry Brunner, OT Acute Rehabilitation Services Pager [phone] Office [phone]     Chancy Milroy 09/05/2019, 9:25 AM

## 2019-09-05 NOTE — Progress Notes (Signed)
Would always take off pulse ox , explained the importance of monitoring  Oxygen level but non- compliant. Not in any form of distress , continue to monitor.

## 2019-09-05 NOTE — Progress Notes (Signed)
Physical Therapy Treatment Patient Details Name: Jacob Brady MRN: [id number] DOB: 1960/10/21 Today's Date: 09/05/2019    History of Present Illness Pt is a 59 yo male with poorly controlled insulin-dependent DM, HTN, HLD, major depressive disorder, PTSD, and pancreatitis who was seen for the evaluation of chest pain. Upon admission, pt dx with unstable angina and reduced EF (45-54%). He underwent CABG 2/4.    PT Comments    Pt presents with decreased safety awareness and decreased insight to deficits impeding safe d/c home alone, he would require 24/7 supervision at this time. Did not attempt stairs today as pt with multiple episodes of LOB and instability, with decreased SpO2 into low 80s with 6Lo2 via vent mask during ambulation. Pt disconnected self from all lines and O2 to go to bathroom and had no shoes or no-skid socks on. Pt with no acknowledgment of the decreased safety or self assessment of his SOB and decreased SpO2 on RA. Acute PT to cont to follow.     Follow Up Recommendations  Home health PT;Supervision/Assistance - 24 hour     Equipment Recommendations  Rolling walker with 5" wheels    Recommendations for Other Services       Precautions / Restrictions Precautions Precautions: Fall;Sternal Precaution Booklet Issued: No Precaution Comments: pt with no recall until PT started re-educating pt on them Restrictions Weight Bearing Restrictions: Yes Other Position/Activity Restrictions: sternal precautions    Mobility  Bed Mobility               General bed mobility comments: pt in bathroom upon PT arrival  Transfers Overall transfer level: Needs assistance Equipment used: None Transfers: Sit to/from Stand Sit to Stand: Supervision         General transfer comment: pt slightly impulsive requiring supervision for safety  Ambulation/Gait Ambulation/Gait assistance: Min guard;Min assist Gait Distance (Feet): 200 Feet Assistive device: Rolling  walker (2 wheeled) Gait Pattern/deviations: Step-through pattern;Decreased stride length;Staggering right Gait velocity: impulsively quickl Gait velocity interpretation: 1.31 - 2.62 ft/sec, indicative of limited community ambulator General Gait Details: pt unsteady with 2 episodes of LOB requiring minA to regain balance. Pt amb with 6Lo2 via vent it mask, despite drop in SpO2 into 80s pt continues to amb despite dizzienss and LOB, PT required pt to sit at about 120' and then at 200'   Stairs             Wheelchair Mobility    Modified Rankin (Stroke Patients Only)       Balance Overall balance assessment: Needs assistance Sitting-balance support: No upper extremity supported;Feet supported Sitting balance-Leahy Scale: Good     Standing balance support: No upper extremity supported;During functional activity Standing balance-Leahy Scale: Poor Standing balance comment: pt unsteady, supervision/min guard for safety                            Cognition Arousal/Alertness: Awake/alert Behavior During Therapy: Impulsive Overall Cognitive Status: Impaired/Different from baseline Area of Impairment: Safety/judgement                         Safety/Judgement: Decreased awareness of safety;Decreased awareness of deficits     General Comments: walking to room and pt's lines were all disconnected on the floor, O2 off and pt was in the bathroom with no grip socks or shoes on      Exercises      General Comments General comments (skin integrity,  edema, etc.): Pt on 6Lo2 via vent i mask, Pt with no awareness of when he needs to sit due to SOB, drop in SpO2 or with dizziness      Pertinent Vitals/Pain Pain Assessment: No/denies pain    Home Living                      Prior Function            PT Goals (current goals can now be found in the care plan section) Progress towards PT goals: Progressing toward goals    Frequency    Min  3X/week      PT Plan Current plan remains appropriate    Co-evaluation              AM-PAC PT "6 Clicks" Mobility   Outcome Measure  Help needed turning from your back to your side while in a flat bed without using bedrails?: None Help needed moving from lying on your back to sitting on the side of a flat bed without using bedrails?: None Help needed moving to and from a bed to a chair (including a wheelchair)?: A Little Help needed standing up from a chair using your arms (e.g., wheelchair or bedside chair)?: A Little Help needed to walk in hospital room?: A Little Help needed climbing 3-5 steps with a railing? : A Lot 6 Click Score: 19    End of Session Equipment Utilized During Treatment: Gait belt;Oxygen Activity Tolerance: Patient tolerated treatment well Patient left: in chair;with call bell/phone within reach Nurse Communication: Mobility status;Other (comment) PT Visit Diagnosis: Difficulty in walking, not elsewhere classified (R26.2);History of falling (Z91.81);Pain     Time: 0488-8916 PT Time Calculation (min) (ACUTE ONLY): 24 min  Charges:  $Gait Training: 23-37 mins                     Jacob Brady, PT, DPT Acute Rehabilitation Services Pager #: [phone] Office #: [phone]    Jacob Brady 09/05/2019, 1:33 PM

## 2019-09-05 NOTE — TOC Progression Note (Signed)
Transition of Care [facility]) - Progression Note    Patient Details  Name: Jacob Brady MRN: [id number] Date of Birth: 31-Jan-1961  Transition of Care [facility]) CM/SW Contact  Gildardo Griffes, [place] Phone Number: [phone] 09/05/2019, 10:20 AM  Clinical Narrative:      CSW spoke with patient regarding groceries and barriers to getting them. He reports getting winded and it's a mile to the [employer]. CSW has reached out to [employer], they report no specific programs for getting groceries for patient. This was relayed to him. He reports he's unable to do grocery delivery program through an app as he has a regular flip phone. CSW suggested patient use his apartment complex computers to go online and order his groceries for delivery (he resides at [facility] apartment complex). CSW also notes patient has a sister and daughter, CSW suggested reaching out to family/friend support as well. CSW lastly suggested patient become acquainted with his neighbors at his apartment complex, as they may be able to assist. Patient can also take a taxi the 1 mile to the grocery store if he's too winded to get there.   Patient reports he will attempt using his apartment complex computer to order groceries online and get delivered to his home from [employer].       Expected Discharge Plan and Services                                                 Social Determinants of Health (SDOH) Interventions    Readmission Risk Interventions No flowsheet data found.

## 2019-09-06 ENCOUNTER — Other Ambulatory Visit: Payer: Self-pay | Admitting: Cardiothoracic Surgery

## 2019-09-06 ENCOUNTER — Inpatient Hospital Stay (HOSPITAL_COMMUNITY): Payer: No Typology Code available for payment source

## 2019-09-06 DIAGNOSIS — Z951 Presence of aortocoronary bypass graft: Secondary | ICD-10-CM

## 2019-09-06 LAB — GLUCOSE, CAPILLARY: Glucose-Capillary: 133 mg/dL — ABNORMAL HIGH (ref 70–99)

## 2019-09-06 LAB — BASIC METABOLIC PANEL
Anion gap: 14 (ref 5–15)
BUN: 12 mg/dL (ref 6–20)
CO2: 28 mmol/L (ref 22–32)
Calcium: 9.3 mg/dL (ref 8.9–10.3)
Chloride: 98 mmol/L (ref 98–111)
Creatinine, Ser: 0.81 mg/dL (ref 0.61–1.24)
GFR calc Af Amer: >60 mL/min (ref >60)
GFR calc non Af Amer: >60 mL/min (ref >60)
Glucose, Bld: 112 mg/dL — ABNORMAL HIGH (ref 70–99)
Potassium: 4 mmol/L (ref 3.5–5.1)
Sodium: 140 mmol/L (ref 135–145)

## 2019-09-06 LAB — CBC
HCT: 26.1 % — ABNORMAL LOW (ref 39.0–52.0)
Hemoglobin: 8.1 g/dL — ABNORMAL LOW (ref 13.0–17.0)
MCH: 30.1 pg (ref 26.0–34.0)
MCHC: 31 g/dL (ref 30.0–36.0)
MCV: 97 fL (ref 80.0–100.0)
Platelets: 242 10*3/uL (ref 150–400)
RBC: 2.69 MIL/uL — ABNORMAL LOW (ref 4.22–5.81)
RDW: 14.6 % (ref 11.5–15.5)
WBC: 9 10*3/uL (ref 4.0–10.5)
nRBC: 0 % (ref 0.0–0.2)

## 2019-09-06 MED ORDER — COLCHICINE 0.6 MG PO TABS: 0.6000 mg | ORAL_TABLET | Freq: Every day | ORAL | 0 refills | Status: DC

## 2019-09-06 MED ORDER — POTASSIUM CHLORIDE CRYS ER 20 MEQ PO TBCR: 20.0000 meq | EXTENDED_RELEASE_TABLET | Freq: Every day | ORAL | 1 refills | Status: DC

## 2019-09-06 MED ORDER — CLOPIDOGREL BISULFATE 75 MG PO TABS: 75.0000 mg | ORAL_TABLET | Freq: Every day | ORAL | 2 refills | Status: DC

## 2019-09-06 MED ORDER — INSULIN NPH ISOPHANE & REGULAR (70-30) 100 UNIT/ML ~~LOC~~ SUSP: 25.0000 [IU] | Freq: Two times a day (BID) | SUBCUTANEOUS | 11 refills | Status: DC

## 2019-09-06 MED ORDER — ATORVASTATIN CALCIUM 80 MG PO TABS: 80.0000 mg | ORAL_TABLET | Freq: Every day | ORAL | 2 refills | Status: DC

## 2019-09-06 MED ORDER — METOPROLOL TARTRATE 25 MG PO TABS: 25.0000 mg | ORAL_TABLET | Freq: Two times a day (BID) | ORAL | 2 refills | Status: DC

## 2019-09-06 MED ORDER — ISOSORBIDE MONONITRATE ER 30 MG PO TB24: 30.0000 mg | ORAL_TABLET | Freq: Every day | ORAL | 2 refills | Status: DC

## 2019-09-06 MED ORDER — FUROSEMIDE 40 MG PO TABS: 40.0000 mg | ORAL_TABLET | Freq: Every day | ORAL | 1 refills | Status: DC

## 2019-09-06 MED ORDER — TRAMADOL HCL 50 MG PO TABS: 50.0000 mg | ORAL_TABLET | ORAL | 0 refills | Status: DC | PRN

## 2019-09-06 MED ORDER — TRAMADOL HCL 50 MG PO TABS: 50.0000 mg | ORAL_TABLET | ORAL | 0 refills | Status: AC | PRN

## 2019-09-06 MED FILL — CLOPIDOGREL 75 MG TABLET: 75 | 30 days supply | Qty: 30 | Fill #0

## 2019-09-06 MED FILL — POTASSIUM CHLORIDE 20meq ER: 20 | 10 days supply | Qty: 10 | Fill #0

## 2019-09-06 MED FILL — ISOSORBIDE MN ER 30 MG TAB: 30 | 30 days supply | Qty: 30 | Fill #0

## 2019-09-06 MED FILL — COLCHICINE 0.6 MG TABS: 0.6 | 30 days supply | Qty: 30 | Fill #0

## 2019-09-06 MED FILL — traMADol HCL 50 MG TABS: 50 | 7 days supply | Qty: 40 | Fill #0

## 2019-09-06 MED FILL — METOPROLOL TARTRATE 25 MG T: 25 | 30 days supply | Qty: 60 | Fill #0

## 2019-09-06 MED FILL — ATORVASTATIN CALCIUM 80 MG: 80 | 30 days supply | Qty: 30 | Fill #0

## 2019-09-06 MED FILL — FUROSEMIDE 40 MG TABLET: 40 | 10 days supply | Qty: 10 | Fill #0

## 2019-09-06 NOTE — Plan of Care (Signed)

## 2019-09-06 NOTE — Progress Notes (Signed)
Physical Therapy Treatment Patient Details Name: Jacob Brady MRN: [id number] DOB: [birth year] Today's Date: 09/06/2019    History of Present Illness Pt is a 59 yo male with poorly controlled insulin-dependent DM, HTN, HLD, major depressive disorder, PTSD, and pancreatitis who was seen for the evaluation of chest pain. Upon admission, pt dx with unstable angina and reduced EF (45-54%). He underwent CABG 2/4.    PT Comments    Pt tolerated treatment well, negotiating stairs without assistance requirements and use of rail. PT provides reinforcement of sternal precautions and recommended progression of home exercise program. Pt will benefit from continued acute PT POC to improve activity tolerance and reinforce sternal precautions.   Follow Up Recommendations  Home health PT;Supervision/Assistance - 24 hour     Equipment Recommendations  Rolling walker with 5" wheels(for community mobility)    Recommendations for Other Services       Precautions / Restrictions Precautions Precautions: Fall;Sternal Precaution Booklet Issued: No Precaution Comments: pt verbalizes sternal precautions, does require some cueing during mobility Restrictions Weight Bearing Restrictions: Yes Other Position/Activity Restrictions: sternal precautions    Mobility  Bed Mobility Overal bed mobility: (pt received and left sitting at edge of bed)                Transfers Overall transfer level: Independent Equipment used: None Transfers: Sit to/from Stand Sit to Stand: Independent         General transfer comment: pt requires cues to reduce pushing through BUEs  Ambulation/Gait Ambulation/Gait assistance: Independent Gait Distance (Feet): 100 Feet Assistive device: None Gait Pattern/deviations: Step-through pattern Gait velocity: functional Gait velocity interpretation: >2.62 ft/sec, indicative of community ambulatory General Gait Details: pt with steady step through gait, no balance  deviations noted   Stairs Stairs: Yes Stairs assistance: Modified independent (Device/Increase time) Stair Management: One rail Left;Alternating pattern Number of Stairs: 10     Wheelchair Mobility    Modified Rankin (Stroke Patients Only)       Balance Overall balance assessment: Independent Sitting-balance support: No upper extremity supported;Feet supported Sitting balance-Leahy Scale: Normal     Standing balance support: No upper extremity supported Standing balance-Leahy Scale: Normal                              Cognition Arousal/Alertness: Awake/alert Behavior During Therapy: Impulsive Overall Cognitive Status: Within Functional Limits for tasks assessed                           Safety/Judgement: Decreased awareness of deficits;Decreased awareness of safety            Exercises      General Comments General comments (skin integrity, edema, etc.): pt on RA with stable sats during session      Pertinent Vitals/Pain Pain Assessment: Faces Faces Pain Scale: Hurts little more Pain Location: chest incision Pain Descriptors / Indicators: Sore Pain Intervention(s): Patient requesting pain meds-RN notified(RN present durign session and offering pain meds, pt decline)    Home Living                      Prior Function            PT Goals (current goals can now be found in the care plan section) Acute Rehab PT Goals Patient Stated Goal: "Get some support" Progress towards PT goals: Progressing toward goals    Frequency  Min 3X/week      PT Plan Current plan remains appropriate    Co-evaluation              AM-PAC PT "6 Clicks" Mobility   Outcome Measure  Help needed turning from your back to your side while in a flat bed without using bedrails?: None Help needed moving from lying on your back to sitting on the side of a flat bed without using bedrails?: None Help needed moving to and from a bed to a  chair (including a wheelchair)?: A Little Help needed standing up from a chair using your arms (e.g., wheelchair or bedside chair)?: A Little     6 Click Score: 14    End of Session   Activity Tolerance: Patient tolerated treatment well Patient left: in bed;with call bell/phone within reach Nurse Communication: Mobility status PT Visit Diagnosis: Difficulty in walking, not elsewhere classified (R26.2);History of falling (Z91.81);Pain     Time: 4481-8563 PT Time Calculation (min) (ACUTE ONLY): 8 min  Charges:  $Gait Training: 8-22 mins                     Zenaida Niece, PT, DPT Acute Rehabilitation Pager: [phone]    Zenaida Niece 09/06/2019, 11:41 AM

## 2019-09-06 NOTE — Progress Notes (Signed)
7 Days Post-Op Procedure(s) (LRB): CORONARY ARTERY BYPASS GRAFTING (CABG) using LIMA to LAD; RIMA to PDL; endoscopic right greater saphenous vein harvest to Diag1; and left radial harvest sequenced to OM1 and OM2. (N/A) RADIAL ARTERY HARVEST (Left) INDOCYANINE GREEN FLUORESCENCE IMAGING (ICG) (N/A) TRANSESOPHAGEAL ECHOCARDIOGRAM (TEE) (N/A) Endovein Harvest Of Greater Saphenous Vein (Right) Subjective: Out of bed, independent with mobility. Now on RA, O2 sat 96%.  Objective: Vital signs in last 24 hours: Temp:  [97.7 F (36.5 C)-98.7 F (37.1 C)] 97.7 F (36.5 C) (02/11 0731) Pulse Rate:  [75-90] 75 (02/11 0731) Cardiac Rhythm: Normal sinus rhythm;Bundle branch block (02/11 0704) Resp:  [10-28] 15 (02/11 0731) BP: (112-171)/(62-103) 127/62 (02/11 0731) SpO2:  [91 %-99 %] 91 % (02/11 0731) Weight:  [110.9 kg] 110.9 kg (02/11 0412)     Intake/Output from previous day: 02/10 0701 - 02/11 0700 In: 320 [P.O.:320] Out: 3400 [Urine:3400] Intake/Output this shift: Total I/O In: 240 [P.O.:240] Out: -   Physical Exam: General appearance: alert, cooperative and no distress Neurologic: intact Heart: regular rate and rhythm Lungs: Breath sounds are clear anterior and posterior.   Abdomen: soft and non-tender Extremities: all well perfused, expected bruising and mild erythema right thigh.  Wound: All incisions to chest, left arm, right thigh are intact and dry.   Lab Results: Recent Labs    09/04/19 0350 09/04/19 0350 09/05/19 0357 09/06/19 0500  WBC 7.5  --   --  9.0  HGB 7.7*   < > 7.9* 8.1*  HCT 24.3*   < > 25.5* 26.1*  PLT 155  --   --  242   < > = values in this interval not displayed.   BMET:  Recent Labs    09/05/19 0357 09/06/19 0500  NA 138 140  K 4.2 4.0  CL 102 98  CO2 27 28  GLUCOSE 131* 112*  BUN 12 12  CREATININE 0.77 0.81  CALCIUM 8.6* 9.3    PT/INR: No results for input(s): LABPROT, INR in the last 72 hours. ABG    Component Value Date/Time   PHART 7.375 09/01/2019 0506   HCO3 23.4 09/01/2019 0506   TCO2 25 09/01/2019 0506   ACIDBASEDEF 2.0 09/01/2019 0506   O2SAT 94.0 09/01/2019 0506   CBG (last 3)  Recent Labs    09/05/19 1626 09/05/19 2136 09/06/19 0633  GLUCAP 138* 136* 133*    Assessment/Plan: S/P Procedure(s) (LRB): CORONARY ARTERY BYPASS GRAFTING (CABG) using LIMA to LAD; RIMA to PDL; endoscopic right greater saphenous vein harvest to Diag1; and left radial harvest sequenced to OM1 and OM2. 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 Pre-op EF 60-65% by ECHO. BP stable off midodrine. Continue ASA, atorvastatin, Coreg. Pacer wires removed this AM.   -Pulm- Now on RA, sat 96%.  Check CXR this AM.   -Volume Excess- Wt now below pre-op.   -Expected acute blood loss anemia- Transfused early post-op. Hct is trending up.  -Type 2 DM- Glucose well controlled on Levemir and SSI. Plan to resume his insulin 70/30 after discharge.   -Disposition- Plan discharge to home later today if f/u CXR is OK. To f/u in office on 2/22.    LOS: 10 days    Leary Roca, [place] [phone] 09/06/2019

## 2019-09-06 NOTE — Progress Notes (Signed)
Pt transported to ED per his request to call cab for Pick up/

## 2019-09-06 NOTE — TOC Progression Note (Addendum)
Transition of Care [facility]) - Progression Note    Patient Details  Name: Jacob Brady MRN: [id number] Date of Birth: 1960/11/23  Transition of Care [facility]) CM/SW Contact  Leone Haven, RN Phone Number: 09/06/2019, 12:38 PM  Clinical Narrative:    NCM [facility]ed scripts to [facility] at [phone], so the [place] pharmacy can fill them.  NCM awaiting to hear back, patient does not have any funds to pay for meds. Patient MD is out of town and will not be back til next week.  NCM assisted patient with meds. TOC will fill these and bring to his room.        Expected Discharge Plan and Services           Expected Discharge Date: 09/06/19                                     Social Determinants of Health (SDOH) Interventions    Readmission Risk Interventions No flowsheet data found.

## 2019-09-06 NOTE — Progress Notes (Signed)
[id number] Pt stated he worked with PT earlier and did some stairs and walked without walker. His sats are 96%RA in room. Discussed with pt sternal precautions and staying in the tube, importance of IS, walking guidelines, carb counting, smoking cessation, CRP 2. Gave pt heart healthy and diabetic diets. Gave smoking cessation handout. Pt stated he is going to quit one day at a time and he has nicotine patches with step one to three from [place]. Referred to [place] CRP 2 which pt is very interested. Will need transportation.  Pt voiced understanding of all ed. Luetta Nutting RN BSN 09/06/2019 11:12 AM

## 2019-09-10 ENCOUNTER — Encounter: Payer: Self-pay | Admitting: Cardiothoracic Surgery

## 2019-09-10 ENCOUNTER — Ambulatory Visit (INDEPENDENT_AMBULATORY_CARE_PROVIDER_SITE_OTHER): Payer: Self-pay | Admitting: Cardiothoracic Surgery

## 2019-09-10 ENCOUNTER — Ambulatory Visit
Admission: RE | Admit: 2019-09-10 | Discharge: 2019-09-10 | Disposition: A | Discharge status: Home / Self Care | Payer: No Typology Code available for payment source | Source: Ambulatory Visit | Attending: Cardiothoracic Surgery | Admitting: Cardiothoracic Surgery

## 2019-09-10 ENCOUNTER — Other Ambulatory Visit: Payer: Self-pay

## 2019-09-10 VITALS — BP 103/70 | HR 88 | Temp 97.7°F | Resp 16 | Ht 70.0 in | Wt 235.2 lb

## 2019-09-10 DIAGNOSIS — I2510 Atherosclerotic heart disease of native coronary artery without angina pectoris: Secondary | ICD-10-CM

## 2019-09-10 DIAGNOSIS — Z951 Presence of aortocoronary bypass graft: Secondary | ICD-10-CM
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
## 2019-09-12 ENCOUNTER — Telehealth (HOSPITAL_COMMUNITY): Payer: Self-pay | Admitting: *Deleted
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
## 2019-09-19 ENCOUNTER — Ambulatory Visit: Payer: No Typology Code available for payment source | Admitting: Physician Assistant

## 2019-09-20 ENCOUNTER — Telehealth: Payer: Self-pay

## 2019-09-20 ENCOUNTER — Other Ambulatory Visit: Payer: Self-pay

## 2019-09-20 DIAGNOSIS — Z951 Presence of aortocoronary bypass graft: Secondary | ICD-10-CM

## 2019-09-20 MED ORDER — ATORVASTATIN CALCIUM 80 MG PO TABS: 80.0000 mg | ORAL_TABLET | Freq: Every day | ORAL | 2 refills | Status: DC

## 2019-09-20 MED ORDER — METOPROLOL TARTRATE 25 MG PO TABS: 25.0000 mg | ORAL_TABLET | Freq: Two times a day (BID) | ORAL | 2 refills | Status: DC

## 2019-09-20 MED ORDER — ISOSORBIDE MONONITRATE ER 30 MG PO TB24: 30.0000 mg | ORAL_TABLET | Freq: Every day | ORAL | 2 refills | Status: DC

## 2019-09-20 MED ORDER — CLOPIDOGREL BISULFATE 75 MG PO TABS: 75.0000 mg | ORAL_TABLET | Freq: Every day | ORAL | 2 refills | Status: DC

## 2019-09-20 NOTE — Telephone Encounter (Signed)
Patient is calling or RX's that he was given at discharge. He has miss placed them and unable to locate. He is requesting new RX's and [facility].  The RX's are Imdur/lipitor/plavix/metoprolol

## 2019-09-21 ENCOUNTER — Other Ambulatory Visit: Payer: Self-pay | Admitting: Cardiothoracic Surgery

## 2019-09-21 DIAGNOSIS — Z951 Presence of aortocoronary bypass graft: Secondary | ICD-10-CM

## 2019-09-24 ENCOUNTER — Ambulatory Visit (INDEPENDENT_AMBULATORY_CARE_PROVIDER_SITE_OTHER): Payer: Self-pay | Admitting: Cardiothoracic Surgery

## 2019-09-24 ENCOUNTER — Ambulatory Visit
Admission: RE | Admit: 2019-09-24 | Discharge: 2019-09-24 | Disposition: A | Discharge status: Home / Self Care | Payer: No Typology Code available for payment source | Source: Ambulatory Visit | Attending: Cardiothoracic Surgery | Admitting: Cardiothoracic Surgery

## 2019-09-24 ENCOUNTER — Other Ambulatory Visit: Payer: Self-pay

## 2019-09-24 VITALS — BP 130/78 | HR 90 | Temp 96.9°F | Resp 20 | Ht 71.0 in | Wt 237.0 lb

## 2019-09-24 DIAGNOSIS — Z951 Presence of aortocoronary bypass graft: Secondary | ICD-10-CM

## 2019-09-24 NOTE — Progress Notes (Signed)
[address]       [place] [address]             [phone]     CARDIOTHORACIC SURGERY OFFICE NOTE  Referring Provider is Leonie Man, MD Primary Cardiologist is No primary care provider on file. [facility]   HPI: 59 yo man s/p CABG 08/30/19. Doing well; presents for routine f/u.  Denies CP or SOB.    Current Outpatient Medications  Medication Sig Dispense Refill  . atorvastatin (LIPITOR) 80 MG tablet Take 1 tablet (80 mg total) by mouth daily at 6 PM. 30 tablet 2  . busPIRone (BUSPAR) 10 MG tablet Take 10 mg by mouth at bedtime.     . clopidogrel (PLAVIX) 75 MG tablet Take 1 tablet (75 mg total) by mouth daily. 30 tablet 2  . colchicine 0.6 MG tablet Take 1 tablet (0.6 mg total) by mouth daily. 30 tablet 0  . divalproex (DEPAKOTE) 500 MG DR tablet Take 1,000 mg by mouth at bedtime.    . gabapentin (NEURONTIN) 300 MG capsule Take 600 mg by mouth at bedtime.     . insulin NPH-regular Human (70-30) 100 UNIT/ML injection Inject 25 Units into the skin 2 (two) times daily with a meal. 10 mL 11  . isosorbide mononitrate (IMDUR) 30 MG 24 hr tablet Take 1 tablet (30 mg total) by mouth daily. For 3 months then stop. 30 tablet 2  . lisinopril (ZESTRIL) 40 MG tablet Take 40 mg by mouth daily.    . metoprolol tartrate (LOPRESSOR) 25 MG tablet Take 1 tablet (25 mg total) by mouth 2 (two) times daily. 60 tablet 2  . mirtazapine (REMERON) 30 MG tablet Take 1 tablet (30 mg total) by mouth at bedtime. 30 tablet 0  . potassium chloride SA (KLOR-CON) 20 MEQ tablet Take 1 tablet (20 mEq total) by mouth daily. 10 tablet 1  . venlafaxine (EFFEXOR) 75 MG tablet Take 150 mg by mouth every morning.     Marland Kitchen aspirin EC 81 MG tablet Take 1 tablet (81 mg total) by mouth daily. (Patient not taking: Reported on 09/24/2019)    . furosemide (LASIX) 40 MG tablet Take 1 tablet (40 mg total) by mouth daily for 10 days. 10 tablet 1   No current facility-administered  medications for this visit.      Physical Exam:   BP 130/78 (BP Location: Right Arm, Patient Position: Sitting, Cuff Size: Normal)   Pulse 90   Temp (!) 96.9 F (36.1 C) (Temporal)   Resp 20   Ht 5\' 11"  (1.803 m)   Wt 107.5 kg   SpO2 98% Comment: RA  BMI 33.05 kg/m   General:  Well-appearing, NAD  Chest:   cta  CV:   rrr  Incisions:  C/d/i  Abdomen:  sntnd  Extremities:  Mild tenderness right thigh at SVG harvest tunnel  Diagnostic Tests:  CXR with clear lung fields   Impression:  Doing well after CABG  Plan:  Have reviewed meds with patient and provided another list of what we consider his current meds. He will contact [facility] to relay this information.  F/u with TCTS as needed.   I spent in excess of 20 minutes during the conduct of this office consultation and >50% of this time involved direct face-to-face encounter with the patient for counseling and/or coordination of their care.  Level 2  10 minutes Level 3                 15 minutes Level 4                 25 minutes Level 5                 40 minutes  B. Lorayne Marek, MD 09/24/2019 6:25 PM

## 2019-09-27 ENCOUNTER — Telehealth: Payer: Self-pay

## 2019-09-27 DIAGNOSIS — Z951 Presence of aortocoronary bypass graft: Secondary | ICD-10-CM

## 2019-09-27 MED ORDER — CLOPIDOGREL BISULFATE 75 MG PO TABS: 75.0000 mg | ORAL_TABLET | Freq: Every day | ORAL | 2 refills | Status: AC

## 2019-09-27 NOTE — Telephone Encounter (Signed)
Re printed plavix RX [facility]

## 2019-10-04 ENCOUNTER — Encounter: Payer: Self-pay | Admitting: Physician Assistant

## 2019-10-04 NOTE — Progress Notes (Signed)
Cardiology Office Note:    Date:  10/08/2019   ID:  Jacob Brady, DOB 17-Sep-1960, MRN [id number]  PCP:  [facility]  Cardiologist:  Glenetta Hew, MD   Referring MD: [facility]   Chief Complaint  Patient presents with  . Follow-up    s/p CABG    History of Present Illness:    Jacob Brady is a 59 y.o. male with a hx of HTN, HLD, poorly controlled insulin dependent DM, MDD, PTSD, pancreatitis and CAD s/p CABG x 5 on 08/30/19. He presented to [facility] with chest pain that started in December, but deferred cardiology consult, stating that he preferred to follow up at the [facility] in [place] for stress testing. That did not happen due to transportation issues. He returned to the ER 08/26/19 with mildly elevated troponin, LDL of 536, and chest pain found to be in DKA. A1c was 15.5% on 08/05/19. He described nonexertional chest pain and mild DOE in the setting of a long history of smoking cigarettes. Myoview was obtained and revealed large area of inferior lateral wall infarct. He proceeded to heart catheterization which revealed severe multivessel disease and TCTS was consulted. He underwent CABG x 5 with LIMA-LAD, RIMA-PLB of RCA, SVG-D1, and left radial artery to OM1-OM2 on 08/30/19. He tolerated the procedure well, but required pressors for three days post-op. Rhythm remained stable and he discharged on 09/03/19. He has seen TCTS in follow up who attempted to reconcile his medications with his [place] list.   He presents today for follow up. He does not have transportation and has been paying for cabs. He does not have a smart phone.   His cardiac medications from the [place] list include: ASA, plavix toprol 50 mg daily 80 mg lipitor 30 mg imdur 10 mg hydralazine BID 10 mg amlodipine 40 mg lisinopril  He has completed the short course of 40 mg lasix / 20 meQ K daily post surgery.   Overall, doing very well. No chest pain. He can't afford transportation to cardiac rehab  and does not have a smart phone. He is walking 1.5-2 miles daily.    Past Medical History:  Diagnosis Date  . Arthritis   . CAD (coronary artery disease)   . Current every day smoker   . Diabetes mellitus without complication ([place])   . Hx of CABG 08/30/2019   LIMA-LAD, RIMA-PLB of RCA, SVG-D1, and left radial artery to OM1-OM2  . Hyperlipidemia LDL goal <70   . Hypertension   . Major depressive disorder   . Pancreatitis   . Poorly controlled diabetes mellitus ([place])   . PTSD (post-traumatic stress disorder)   . PTSD (post-traumatic stress disorder)     Past Surgical History:  Procedure Laterality Date  . CHOLECYSTECTOMY    . CORONARY ARTERY BYPASS GRAFT N/A 08/30/2019   Procedure: CORONARY ARTERY BYPASS GRAFTING (CABG) using LIMA to LAD; RIMA to PDL; endoscopic right greater saphenous vein harvest to Diag1; and left radial harvest sequenced to OM1 and OM2.;  Surgeon: Wonda Olds, MD;  Location: [facility];  Service: Open Heart Surgery;  Laterality: N/A;  BILATERAL IMA  . ENDOVEIN HARVEST OF GREATER SAPHENOUS VEIN Right 08/30/2019   Procedure: [place] Of Greater Saphenous Vein;  Surgeon: Wonda Olds, MD;  Location: [facility];  Service: Open Heart Surgery;  Laterality: Right;  . LEFT HEART CATH AND CORONARY ANGIOGRAPHY N/A 08/27/2019   Procedure: LEFT HEART CATH AND CORONARY ANGIOGRAPHY;  Surgeon: Troy Sine, MD;  [facility];  Service: Cardiovascular;  Laterality: N/A;  . RADIAL ARTERY HARVEST Left 08/30/2019   Procedure: RADIAL ARTERY HARVEST;  Surgeon: Linden DolinAtkins, Broadus Z, MD;  Location: [facility] OR;  Service: Open Heart Surgery;  Laterality: Left;  . TEE WITHOUT CARDIOVERSION N/A 08/30/2019   Procedure: TRANSESOPHAGEAL ECHOCARDIOGRAM (TEE);  Surgeon: Linden DolinAtkins, Broadus Z, MD;  [facility];  Service: Open Heart Surgery;  Laterality: N/A;  . WRIST FRACTURE SURGERY Right     Current Medications: Current Meds  Medication Sig  . amLODipine (NORVASC) 10 MG tablet  Take 10 mg by mouth daily.  Marland Kitchen. atorvastatin (LIPITOR) 80 MG tablet Take 1 tablet (80 mg total) by mouth daily at 6 PM.  . busPIRone (BUSPAR) 10 MG tablet Take 10 mg by mouth at bedtime.   . clopidogrel (PLAVIX) 75 MG tablet Take 1 tablet (75 mg total) by mouth daily. Please [facility] any [facility]  . divalproex (DEPAKOTE) 500 MG DR tablet Take 1,000 mg by mouth at bedtime.  . gabapentin (NEURONTIN) 300 MG capsule Take 600 mg by mouth at bedtime.   . hydrALAZINE (APRESOLINE) 10 MG tablet Take 10 mg by mouth in the morning and at bedtime.  . insulin NPH-regular Human (70-30) 100 UNIT/ML injection Inject 25 Units into the skin 2 (two) times daily with a meal.  . isosorbide mononitrate (IMDUR) 30 MG 24 hr tablet Take 1 tablet (30 mg total) by mouth daily. For 3 months then stop.  Marland Kitchen. lisinopril (ZESTRIL) 40 MG tablet Take 40 mg by mouth daily.  . metoprolol tartrate (LOPRESSOR) 50 MG tablet Take 50 mg by mouth daily.  . mirtazapine (REMERON) 30 MG tablet Take 1 tablet (30 mg total) by mouth at bedtime.  Marland Kitchen. venlafaxine (EFFEXOR) 75 MG tablet Take 150 mg by mouth every morning.   . [DISCONTINUED] metoprolol tartrate (LOPRESSOR) 25 MG tablet Take 1 tablet (25 mg total) by mouth 2 (two) times daily. (Patient taking differently: Take 50 mg by mouth daily. )  . [DISCONTINUED] potassium chloride SA (KLOR-CON) 20 MEQ tablet Take 1 tablet (20 mEq total) by mouth daily.     Allergies:   Hctz [hydrochlorothiazide] and Seroquel [quetiapine fumarate]   Social History   Socioeconomic History  . Marital status: Divorced    Spouse name: Not on file  . Number of children: Not on file  . Years of education: Not on file  . Highest education level: Not on file  Occupational History  . Occupation: disability  Tobacco Use  . Smoking status: Current Every Day Smoker    Packs/day: 1.50    Years: 35.00    Pack years: 52.50  . Smokeless tobacco: Never Used  Substance and Sexual Activity   . Alcohol use: No  . Drug use: No  . Sexual activity: Not Currently  Other Topics Concern  . Not on file  Social History Narrative  . Not on file   Social Determinants of Health   Financial Resource Strain:   . Difficulty of Paying Living Expenses:   Food Insecurity:   . Worried About Programme researcher, broadcasting/film/videounning Out of Food in the Last Year:   . Baristaan Out of Food in the Last Year:   Transportation Needs:   . Freight forwarderLack of Transportation (Medical):   Marland Kitchen. Lack of Transportation (Non-Medical):   Physical Activity:   . Days of Exercise per Week:   . Minutes of Exercise per Session:   Stress:   . Feeling of Stress :  Social Connections:   . Frequency of Communication with Friends and Family:   . Frequency of Social Gatherings with Friends and Family:   . Attends Religious Services:   . Active Member of Clubs or Organizations:   . Attends Banker Meetings:   Marland Kitchen Marital Status:      Family History: The patient's family history includes Aneurysm in his mother; Brain cancer (age of onset: 95) in his father; Healthy in his brother, sister, sister, and sister.  ROS:   Please see the history of present illness.     All other systems reviewed and are negative.  EKGs/Labs/Other Studies Reviewed:    The following studies were reviewed today:  Left heart cath 08/27/19:  Prox RCA lesion is 95% stenosed.  Prox Cx lesion is 80% stenosed.  Prox Cx to Mid Cx lesion is 85% stenosed.  Mid Cx lesion is 95% stenosed.  Dist Cx-1 lesion is 90% stenosed.  Dist Cx-2 lesion is 50% stenosed.  Ost LAD to Prox LAD lesion is 75% stenosed.  1st Diag lesion is 90% stenosed.  Mid LAD lesion is 40% stenosed.  There is mild left ventricular systolic dysfunction.  The left ventricular ejection fraction is 45-50% by visual estimate.   Severe multivessel CAD with 75% eccentric near ostial LAD stenosis, diffuse 90% stenosis in the first diagonal branch of the LAD with 40% mid LAD stenosis; segmental  high-grade multiple stenoses in a large left circumflex vessel with 80 and 85% proximal stenosis, 95% stenosis at the bifurcation of a large marginal branch followed by 90% stenosis and distal 50% stenosis; large dominant RCA with 95% ulcerated plaque in the proximal to mid vessel.  Mild LV dysfunction with EF estimated 45 to 50% and suggestion of mild distal inferior hypocontractility.  LVEDP 18 mm.  RECOMMENDATION: Angiographic findings were reviewed with Dr. Herbie Baltimore who had seen the patient in cardiology consult.  The patient will be started on IV nitroglycerin drip.  He will to new amlodipine and beta-blocker therapy will be initiated.  Will change statin therapy to atorvastatin 80 mg.  We will add fenofibrate with his marked hypertriglyceridemia and also consider Vascepa post discharge.  He will need stabilization of his diabetic status.  Surgical consultation will be obtained in am.   Echo 08/28/19: 1. Left ventricular ejection fraction, by visual estimation, is 60 to  65%. The left ventricle has normal function. Moderately increased left  ventricular posterior wall thickness. There is severely increased left  ventricular hypertrophy of the basal  septum.  2. The left ventricle has no regional wall motion abnormalities.  3. Left ventricular diastolic parameters are consistent with Grade I  diastolic dysfunction (impaired relaxation).  4. Global right ventricle has normal systolic function.The right  ventricular size is normal. No increase in right ventricular wall  thickness.  5. Left atrial size was normal.  6. Right atrial size was normal.  7. The mitral valve is normal in structure. No evidence of mitral valve  regurgitation. No evidence of mitral stenosis.  8. The tricuspid valve is normal in structure. Tricuspid valve  regurgitation is not demonstrated.  9. The aortic valve is tricuspid. There is moderate thickening and  moderate calcification of the aortic valve.  Aortic valve regurgitation is  not visualized. No evidence of aortic valve sclerosis or stenosis.  10. The pulmonic valve was normal in structure. Pulmonic valve  regurgitation is not visualized.  11. The inferior vena cava is normal in size with greater than 50%  respiratory variability, suggesting right atrial pressure of 3 mmHg.    EKG:  EKG is ordered today.  The ekg ordered today demonstrates sinus rhythm HR 80, RBBB. Left axis deviaiton  Recent Labs: 08/05/2019: TSH 1.974 08/30/2019: ALT 27; B Natriuretic Peptide 59.6 09/02/2019: Magnesium 1.9 09/06/2019: BUN 12; Creatinine, Ser 0.81; Hemoglobin 8.1; Platelets 242; Potassium 4.0; Sodium 140  Recent Lipid Panel    Component Value Date/Time   CHOL 536 (H) 08/27/2019 0203   TRIG 215 (H) 09/01/2019 0500   HDL NOT REPORTED DUE TO HIGH TRIGLYCERIDES 08/27/2019 0203   CHOLHDL NOT REPORTED DUE TO HIGH TRIGLYCERIDES 08/27/2019 0203   VLDL UNABLE TO CALCULATE IF TRIGLYCERIDE OVER 400 mg/dL 87/56/4332 9518   LDLCALC UNABLE TO CALCULATE IF TRIGLYCERIDE OVER 400 mg/dL 84/16/6063 0160   LDLDIRECT NOT CALCULATED 08/27/2019 0203    Physical Exam:    VS:  BP 120/82   Pulse 80   Ht 5\' 11"  (1.803 m)   Wt 238 lb 6.4 oz (108.1 kg)   BMI 33.25 kg/m     Wt Readings from Last 3 Encounters:  10/08/19 238 lb 6.4 oz (108.1 kg)  09/24/19 237 lb (107.5 kg)  09/10/19 235 lb 2.4 oz (106.7 kg)     GEN: obese male, in no acute distress HEENT: Normal NECK: No JVD; No carotid bruits LYMPHATICS: No lymphadenopathy CARDIAC: RRR, no murmurs, rubs, gallops RESPIRATORY:  Clear to auscultation without rales, wheezing or rhonchi  ABDOMEN: Soft, non-tender, non-distended MUSCULOSKELETAL:  No edema; No deformity  SKIN: Warm and dry NEUROLOGIC:  Alert and oriented x 3 PSYCHIATRIC:  Normal affect   ASSESSMENT:    1. Coronary artery disease involving native coronary artery of native heart with unstable angina pectoris (HCC)   2. S/P CABG x 5   3.  Essential hypertension   4. Hyperlipidemia LDL goal <70   5. Diabetes mellitus type 2 in obese (HCC)   6. Current smoker    PLAN:    In order of problems listed above:  CAD s/p CABG x 5 (08/30/19:  LIMA-LAD, RIMA-PLB of RCA, SVG-D1, and left radial artery to OM1-OM2) - ASA and plavix - lopressor, imdur, lisinopril, hydralazine, and amlodipine, as above - pressure is well controlled - no medication changes - no chest pain - he seemed to have little understanding of his bypass surgery - had a long discussion, with pictures - risk factor modification is going to be very important for him - stop smoking and DM control   Hypertension - medications as above   Hyperlipidemia with LDL goal < 70 - 08/27/2019: Cholesterol 536; HDL NOT REPORTED DUE TO HIGH TRIGLYCERIDES; LDL Cholesterol UNABLE TO CALCULATE IF TRIGLYCERIDE OVER 400 mg/dL; VLDL UNABLE TO CALCULATE IF TRIGLYCERIDE OVER 400 mg/dL 10/25/2019: Triglycerides 215 - discharged on 80 mg lipitor - will draw fasting labs today (last ate at 8am) - I suspect he will need lipid clinic- will need to be virtual appt, iff possible, due to cost of transportation   Poorly controlled insulin dependent DM Hx of DKA - A1c 14.8% - on insulin - per PCP   Current smoker - has patches - we had a long discussion about cessation   Will collect BMP, LFTs (for depakote, has not had labs), A1c, and lipids.   Fax labs to Dr. 1/0/9323: [phone]   Follow up appts as virtuals, if possible.  Will see him in 3 months.   Medication Adjustments/Labs and Tests Ordered: Current medicines are reviewed at length with the patient  today.  Concerns regarding medicines are outlined above.  Orders Placed This Encounter  Procedures  . Lipid panel  . Basic metabolic panel  . Hepatic function panel  . Hemoglobin A1c   No orders of the defined types were placed in this encounter.   Signed, Marcelino Duster, [place]  10/08/2019 4:33 PM    [facility]

## 2019-10-08 ENCOUNTER — Other Ambulatory Visit: Payer: Self-pay

## 2019-10-08 ENCOUNTER — Encounter: Payer: Self-pay | Admitting: Physician Assistant

## 2019-10-08 ENCOUNTER — Ambulatory Visit (INDEPENDENT_AMBULATORY_CARE_PROVIDER_SITE_OTHER): Payer: No Typology Code available for payment source | Admitting: Physician Assistant

## 2019-10-08 VITALS — BP 120/82 | HR 80 | Ht 71.0 in | Wt 238.4 lb

## 2019-10-08 DIAGNOSIS — F17200 Nicotine dependence, unspecified, uncomplicated: Secondary | ICD-10-CM

## 2019-10-08 DIAGNOSIS — I10 Essential (primary) hypertension: Secondary | ICD-10-CM

## 2019-10-08 DIAGNOSIS — I25110 Atherosclerotic heart disease of native coronary artery with unstable angina pectoris: Secondary | ICD-10-CM | POA: Diagnosis not present

## 2019-10-08 DIAGNOSIS — Z951 Presence of aortocoronary bypass graft: Secondary | ICD-10-CM | POA: Diagnosis not present

## 2019-10-08 DIAGNOSIS — E1169 Type 2 diabetes mellitus with other specified complication: Secondary | ICD-10-CM

## 2019-10-08 DIAGNOSIS — E669 Obesity, unspecified: Secondary | ICD-10-CM

## 2019-10-08 DIAGNOSIS — E785 Hyperlipidemia, unspecified: Secondary | ICD-10-CM

## 2019-10-08 DIAGNOSIS — E119 Type 2 diabetes mellitus without complications: Secondary | ICD-10-CM

## 2019-10-08 NOTE — Patient Instructions (Addendum)
Medication Instructions:  Your physician recommends that you continue on your current medications as directed. Please refer to the Current Medication list given to you today.  STOP Furosemide and Potassium  *If you need a refill on your cardiac medications before your next appointment, please call your pharmacy*   Lab Work: Your physician recommends that you return for lab work: TODAY--BMET, LIPID< Hepatic function panel, Hem. A1C.  FAX RESULTS TO DR. Jess Barters AT [facility]) [phone]  If you have labs (blood work) drawn today and your tests are completely normal, you will receive your results only by: Marland Kitchen MyChart Message (if you have MyChart) OR . A paper copy in the mail If you have any lab test that is abnormal or we need to change your treatment, we will call you to review the results.  Follow-Up: At [facility], you and your health needs are our priority.  As part of our continuing mission to provide you with exceptional heart care, we have created designated Provider Care Teams.  These Care Teams include your primary Cardiologist (physician) and Advanced Practice Providers (APPs -  Physician Assistants and Nurse Practitioners) who all work together to provide you with the care you need, when you need it.  We recommend signing up for the patient portal called "MyChart".  Sign up information is provided on this After Visit Summary.  MyChart is used to connect with patients for Virtual Visits (Telemedicine).  Patients are able to view lab/test results, encounter notes, upcoming appointments, etc.  Non-urgent messages can be sent to your provider as well.   To learn more about what you can do with MyChart, go to [email or web address].    Your next appointment:   3 month(s)  The format for your next appointment:   Virtual Visit   Provider:   You may see Bryan Lemma, MD or one of the following Advanced Practice Providers on your designated Care Team:    Micah Flesher,  [place]   ALSO SCHEDULE: Virtual appointment with LIPID CLINIC (PharmD or Dr. Rennis Golden)

## 2019-10-09 LAB — BASIC METABOLIC PANEL
BUN/Creatinine Ratio: 12 (ref 9–20)
BUN: 12 mg/dL (ref 6–24)
CO2: 21 mmol/L (ref 20–29)
Calcium: 10.5 mg/dL — ABNORMAL HIGH (ref 8.7–10.2)
Chloride: 102 mmol/L (ref 96–106)
Creatinine, Ser: 1.03 mg/dL (ref 0.76–1.27)
GFR calc Af Amer: 93 mL/min/{1.73_m2} (ref >59)
GFR calc non Af Amer: 81 mL/min/{1.73_m2} (ref >59)
Glucose: 78 mg/dL (ref 65–99)
Potassium: 4.7 mmol/L (ref 3.5–5.2)
Sodium: 141 mmol/L (ref 134–144)

## 2019-10-09 LAB — LIPID PANEL
Chol/HDL Ratio: 3.2 ratio (ref 0.0–5.0)
Cholesterol, Total: 137 mg/dL (ref 100–199)
HDL: 43 mg/dL (ref >39)
LDL Chol Calc (NIH): 66 mg/dL (ref 0–99)
Triglycerides: 168 mg/dL — ABNORMAL HIGH (ref 0–149)
VLDL Cholesterol Cal: 28 mg/dL (ref 5–40)

## 2019-10-09 LAB — HEPATIC FUNCTION PANEL
ALT: 9 IU/L (ref 0–44)
AST: 13 IU/L (ref 0–40)
Albumin: 4.8 g/dL (ref 3.8–4.9)
Alkaline Phosphatase: 96 IU/L (ref 39–117)
Bilirubin Total: 0.3 mg/dL (ref 0.0–1.2)
Bilirubin, Direct: 0.11 mg/dL (ref 0.00–0.40)
Total Protein: 8.5 g/dL (ref 6.0–8.5)

## 2019-10-09 LAB — HEMOGLOBIN A1C
Est. average glucose Bld gHb Est-mCnc: 143 mg/dL
Hgb A1c MFr Bld: 6.6 % — ABNORMAL HIGH (ref 4.8–5.6)

## 2019-10-16 ENCOUNTER — Other Ambulatory Visit (INDEPENDENT_AMBULATORY_CARE_PROVIDER_SITE_OTHER): Payer: No Typology Code available for payment source

## 2019-10-16 DIAGNOSIS — I10 Essential (primary) hypertension: Secondary | ICD-10-CM

## 2019-10-25 ENCOUNTER — Encounter: Payer: Self-pay | Admitting: Internal Medicine

## 2019-10-25 ENCOUNTER — Telehealth (INDEPENDENT_AMBULATORY_CARE_PROVIDER_SITE_OTHER): Payer: No Typology Code available for payment source | Admitting: Internal Medicine

## 2019-10-25 VITALS — BP 138/83 | Ht 71.0 in | Wt 238.0 lb

## 2019-10-25 DIAGNOSIS — E119 Type 2 diabetes mellitus without complications: Secondary | ICD-10-CM | POA: Diagnosis not present

## 2019-10-25 DIAGNOSIS — I2510 Atherosclerotic heart disease of native coronary artery without angina pectoris: Secondary | ICD-10-CM

## 2019-10-25 DIAGNOSIS — I25110 Atherosclerotic heart disease of native coronary artery with unstable angina pectoris: Secondary | ICD-10-CM

## 2019-10-25 DIAGNOSIS — E785 Hyperlipidemia, unspecified: Secondary | ICD-10-CM | POA: Diagnosis not present

## 2019-10-25 DIAGNOSIS — I10 Essential (primary) hypertension: Secondary | ICD-10-CM

## 2019-10-25 DIAGNOSIS — E1169 Type 2 diabetes mellitus with other specified complication: Secondary | ICD-10-CM

## 2019-10-25 DIAGNOSIS — Z951 Presence of aortocoronary bypass graft: Secondary | ICD-10-CM

## 2019-10-25 DIAGNOSIS — F17200 Nicotine dependence, unspecified, uncomplicated: Secondary | ICD-10-CM

## 2019-10-25 NOTE — Patient Instructions (Signed)
Medication Instructions:  Your physician recommends that you continue on your current medications as directed. Please refer to the Current Medication list given to you today.  *If you need a refill on your cardiac medications before your next appointment, please call your pharmacy*   Follow-Up: At [facility] [place], you and your health needs are our priority.  As part of our continuing mission to provide you with exceptional heart care, we have created designated Provider Care Teams.  These Care Teams include your primary Cardiologist (physician) and Advanced Practice Providers (APPs -  Physician Assistants and Nurse Practitioners) who all work together to provide you with the care you need, when you need it.  We recommend signing up for the patient portal called "MyChart".  Sign up information is provided on this After Visit Summary.  MyChart is used to connect with patients for Virtual Visits (Telemedicine).  Patients are able to view lab/test results, encounter notes, upcoming appointments, etc.  Non-urgent messages can be sent to your provider as well.   To learn more about what you can do with MyChart, go to [email or web address].    Your next appointment:   AS NEEDED   Provider:   K. Italy Hilty, MD for lipid management

## 2019-10-25 NOTE — Progress Notes (Signed)
Virtual Visit via Telephone Note   This visit type was conducted due to national recommendations for restrictions regarding the COVID-19 Pandemic (e.g. social distancing) in an effort to limit this patient's exposure and mitigate transmission in our community.  Due to his co-morbid illnesses, this patient is at least at moderate risk for complications without adequate follow up.  This format is felt to be most appropriate for this patient at this time.  The patient did not have access to video technology/had technical difficulties with video requiring transitioning to audio format only (telephone).  All issues noted in this document were discussed and addressed.  No physical exam could be performed with this format.  Please refer to the patient's chart for his  consent to telehealth for [facility].   Evaluation Performed: Telephone visit  Date:  10/25/2019   ID:  Jacob Brady, DOB Apr 24, 1961, MRN [id number]  Patient Location:  [address] [place] [address]  Provider location:   [address], [place] [address], [place] [address]  PCP:  [facility]  Cardiologist:  Glenetta Hew, MD Electrophysiologist:  None   Chief Complaint: No complaints  History of Present Illness:    Jacob Brady is a 59 y.o. male who presents via audio/video conferencing for a telehealth visit today.  Jacob Brady is a pleasant 59 year old male who underwent coronary artery bypass grafting in February 2021.  This was a 5 vessel CABG with LIMA to LAD, RIMA to the PLB, SVG to diagonal and left radial artery to OM1 and OM 2.  This was complicated by history of PTSD, obesity, type 2 diabetes, dyslipidemia and hypertension.  His triglycerides had been as high as over 800 however since his surgery he has had major weight loss, almost 45 pounds in 2 months and marked improvement in his metabolic disorder.  His triglycerides have come down from 805-168.  Total cholesterol now 137, HDL  43 and LDL 66 which puts him at target.  He is on high potency atorvastatin.  Hemoglobin A1c is also significantly improved from 14.65-month ago down to 6.6 - 2 weeks ago.  Overall marked improvement in his numbers.  The patient does not have symptoms concerning for COVID-19 infection (fever, chills, cough, or new SHORTNESS OF BREATH).    Prior CV studies:   The following studies were reviewed today:  Chart reviewed Lab work  PMHx:  Past Medical History:  Diagnosis Date  . Arthritis   . CAD (coronary artery disease)   . Current every day smoker   . Diabetes mellitus without complication ([place])   . Hx of CABG 08/30/2019   LIMA-LAD, RIMA-PLB of RCA, SVG-D1, and left radial artery to OM1-OM2  . Hyperlipidemia LDL goal <70   . Hypertension   . Major depressive disorder   . Pancreatitis   . Poorly controlled diabetes mellitus ([place])   . PTSD (post-traumatic stress disorder)   . PTSD (post-traumatic stress disorder)     Past Surgical History:  Procedure Laterality Date  . CHOLECYSTECTOMY    . CORONARY ARTERY BYPASS GRAFT N/A 08/30/2019   Procedure: CORONARY ARTERY BYPASS GRAFTING (CABG) using LIMA to LAD; RIMA to PDL; endoscopic right greater saphenous vein harvest to Diag1; and left radial harvest sequenced to OM1 and OM2.;  Surgeon: Wonda Olds, MD;  Location: [facility];  Service: Open Heart Surgery;  Laterality: N/A;  BILATERAL IMA  . ENDOVEIN HARVEST OF GREATER SAPHENOUS VEIN Right 08/30/2019   Procedure: [place] [facility] Greater  Saphenous Vein;  Surgeon: Linden Dolin, MD;  Location: [facility] OR;  Service: Open Heart Surgery;  Laterality: Right;  . LEFT HEART CATH AND CORONARY ANGIOGRAPHY N/A 08/27/2019   Procedure: LEFT HEART CATH AND CORONARY ANGIOGRAPHY;  Surgeon: Lennette Bihari, MD;  Location: [facility] INVASIVE CV LAB;  Service: Cardiovascular;  Laterality: N/A;  . RADIAL ARTERY HARVEST Left 08/30/2019   Procedure: RADIAL ARTERY HARVEST;  Surgeon: Linden Dolin, MD;  Location: [facility]  OR;  Service: Open Heart Surgery;  Laterality: Left;  . TEE WITHOUT CARDIOVERSION N/A 08/30/2019   Procedure: TRANSESOPHAGEAL ECHOCARDIOGRAM (TEE);  Surgeon: Linden Dolin, MD;  Location: [facility] OR;  Service: Open Heart Surgery;  Laterality: N/A;  . WRIST FRACTURE SURGERY Right     FAMHx:  Family History  Problem Relation Age of Onset  . Aneurysm Mother        Brain  . Brain cancer Father 15  . Healthy Sister   . Healthy Brother   . Healthy Sister   . Healthy Sister     SOCHx:   reports that he has been smoking. He has a 52.50 pack-year smoking history. He has never used smokeless tobacco. He reports that he does not drink alcohol or use drugs.  ALLERGIES:  Allergies  Allergen Reactions  . Hctz [Hydrochlorothiazide] Other (See Comments)    Unknown rxn, "they just told me not to take it anymore"  . Seroquel [Quetiapine Fumarate] Other (See Comments)    Elevates liver enzymes     MEDS:  Current Meds  Medication Sig  . amLODipine (NORVASC) 10 MG tablet Take 10 mg by mouth daily.  Marland Kitchen atorvastatin (LIPITOR) 80 MG tablet Take 1 tablet (80 mg total) by mouth daily at 6 PM.  . busPIRone (BUSPAR) 10 MG tablet Take 10 mg by mouth at bedtime.   . clopidogrel (PLAVIX) 75 MG tablet Take 1 tablet (75 mg total) by mouth daily. Please [facility] Jess Barters  . divalproex (DEPAKOTE) 500 MG DR tablet Take 1,000 mg by mouth at bedtime.  . gabapentin (NEURONTIN) 300 MG capsule Take 600 mg by mouth at bedtime.   . hydrALAZINE (APRESOLINE) 10 MG tablet Take 10 mg by mouth in the morning and at bedtime.  . insulin NPH-regular Human (70-30) 100 UNIT/ML injection Inject 25 Units into the skin 2 (two) times daily with a meal.  . isosorbide mononitrate (IMDUR) 30 MG 24 hr tablet Take 1 tablet (30 mg total) by mouth daily. For 3 months then stop.  Marland Kitchen lisinopril (ZESTRIL) 40 MG tablet Take 40 mg by mouth daily.  . metoprolol tartrate (LOPRESSOR) 50 MG tablet Take 50 mg by  mouth daily.  . mirtazapine (REMERON) 30 MG tablet Take 1 tablet (30 mg total) by mouth at bedtime.  Marland Kitchen venlafaxine (EFFEXOR) 75 MG tablet Take 150 mg by mouth every morning.      ROS: Pertinent items noted in HPI and remainder of comprehensive ROS otherwise negative.  Labs/Other Tests and Data Reviewed:    Recent Labs: 08/05/2019: TSH 1.974 08/30/2019: B Natriuretic Peptide 59.6 09/02/2019: Magnesium 1.9 09/06/2019: Hemoglobin 8.1; Platelets 242 10/08/2019: ALT 9; BUN 12; Creatinine, Ser 1.03; Potassium 4.7; Sodium 141   Recent Lipid Panel Lab Results  Component Value Date/Time   CHOL 137 10/08/2019 04:31 PM   TRIG 168 (H) 10/08/2019 04:31 PM   HDL 43 10/08/2019 04:31 PM   CHOLHDL 3.2 10/08/2019 04:31 PM   CHOLHDL NOT REPORTED DUE TO HIGH TRIGLYCERIDES 08/27/2019  02:03 AM   LDLCALC 66 10/08/2019 04:31 PM   LDLDIRECT NOT CALCULATED 08/27/2019 02:03 AM    Wt Readings from Last 3 Encounters:  10/25/19 238 lb (108 kg)  10/08/19 238 lb 6.4 oz (108.1 kg)  09/24/19 237 lb (107.5 kg)     Exam:    Vital Signs:  BP 138/83   Ht 5\' 11"  (1.803 m)   Wt 238 lb (108 kg)   BMI 33.19 kg/m    Exam not performed due to telephone visit  ASSESSMENT & PLAN:    1. Dyslipidemia, goal LDL less than 70 2. Coronary artery disease status post 5 vessel CABG (08/2019) 3. Type 2 diabetes-A1c 6.6 4. Morbid obesity with recent significant weight loss  Jacob Brady has had significant weight loss, dietary changes and in combination with medications significant improvement in his numbers.  A1c has come down from 14.8-6.6, and his triglycerides have come down from over 800 to near normal.  His LDL cholesterol is at goal.  I do not see the need for additional medication changes at this point.  He should continue with dietary modifications, improved activity levels and focus on maintaining his metabolic improvements.  He can follow-up with me as needed.  Thanks again for the kind referral.  COVID-19  Education: The signs and symptoms of COVID-19 were discussed with the patient and how to seek care for testing (follow up with PCP or arrange E-visit).  The importance of social distancing was discussed today.  Patient Risk:   After full review of this patients clinical status, I feel that they are at least moderate risk at this time.  Time:   Today, I have spent 25 minutes with the patient with telehealth technology discussing dyslipidemia, weight loss, type 2 diabetes, coronary artery disease.     Medication Adjustments/Labs and Tests Ordered: Current medicines are reviewed at length with the patient today.  Concerns regarding medicines are outlined above.   Tests Ordered: No orders of the defined types were placed in this encounter.   Medication Changes: No orders of the defined types were placed in this encounter.   Disposition:  prn  Laurell Josephs, MD, Chrystie Nose  [place]  [facility]  Medical Director of the Advanced Lipid Disorders &  Cardiovascular Risk Reduction Clinic Diplomate of the American Board of Clinical Lipidology Attending Cardiologist  Direct Dial: [phone]  Fax: [phone]  Website:  [email or web address]  [phone], MD  10/25/2019 8:39 AM

## 2019-10-26 ENCOUNTER — Telehealth: Payer: Self-pay | Admitting: Physician Assistant

## 2019-10-26 NOTE — Telephone Encounter (Signed)
   Pt said his left arm been in pain for a quite awhile now. He said its where they took the vein out during his heart surgery. It's painful enough that it keeps him awake at night, if he try to straighten it out it won't straight up, his deltoid and bicep feels like its burning    Please advise

## 2019-10-26 NOTE — Telephone Encounter (Signed)
Contacted patient, he states that since he had his procedure completed in February- he has had constant aching pain in his arms- he states it is his bicep/deltoid area on his arm, its a numb feeling. He states that he still has twinges in his chest (like a stinging pain) in his chest as well, nothing like he had before, but unsure if this was normal. He states that when he straightens his arms or doing any activites he becomes very achy and it is causing him issues sleeping on it as well. He has no bruising, or redness on this arm. He has tried OTC pain medication, tylenol and he states it does not work at all. He does not know if this will go away on its own or if it is a lifetime pain he will be in.   I advised that Dr.Harding was on Vacation but he will respond and we would give him a call back as soon as we could- he had a visit with Lipid clinic yesterday, but failed to mention this as he was unsure if they could help him with this. Will route to MD.  Thanks!

## 2019-10-26 NOTE — Telephone Encounter (Signed)
Contacted patient- advised to call back. Left call back number.

## 2019-10-29 NOTE — Telephone Encounter (Signed)
Hard to tell what this is.  Makes me concerned with him being on high-dose atorvastatin.  What I would like to do is hold the atorvastatin for 2-3 weeks.  Reassess at that time.  If symptoms have improved, then we will switch him to rosuvastatin and see how he does with that. Bryan Lemma, MD

## 2019-10-30 NOTE — Telephone Encounter (Signed)
Contacted patient- he was advised to stop his Atorvastatin for 2-3 weeks and call back to let us know how he was doing. Patient verbalized understanding.

## 2019-10-31 ENCOUNTER — Telehealth: Payer: Self-pay | Admitting: Cardiology

## 2019-10-31 NOTE — Telephone Encounter (Signed)
New Message  Pt stated that he is experiencing stinging in chest around middle to right area. Happens for around 5-10 seconds and it's getting worse. Started around 2 weeks and has increasingly gotten worse  Please call to discuss ?

## 2019-10-31 NOTE — Telephone Encounter (Signed)
Returned the call to the patient. The patient stated that he was having a chest pain that felt like a stinging sensation. He rated this a 10/10. Nothing relieves the pain. This has been going on for two weeks now and has been gradually getting worse.   The patient had a CABG x5 in February. He does take Imdur 30 mg once daily with no relief.  He has been advised to go to the ED since the pain has been getting worse for further evaluation. He has declined stating he would rather wait one more day to see if it's better. If the pain is not better tomorrow then he will go to the ED. The patient has been advised to call 911 if the pain does not get better today.  The office triage nurse should alert Trish if he decide to go to the ED.

## 2019-12-23 ENCOUNTER — Encounter: Payer: Self-pay | Admitting: Cardiology

## 2019-12-23 DIAGNOSIS — E785 Hyperlipidemia, unspecified: Secondary | ICD-10-CM | POA: Insufficient documentation

## 2019-12-23 NOTE — Progress Notes (Signed)
Virtual Visit via Telephone Note   This visit type was conducted due to national recommendations for restrictions regarding the COVID-19 Pandemic (e.g. social distancing) in an effort to limit this patient's exposure and mitigate transmission in our community.  Due to his co-morbid illnesses, this patient is at least at moderate risk for complications without adequate follow up.  This format is felt to be most appropriate for this patient at this time.  The patient did not have access to video technology/had technical difficulties with video requiring transitioning to audio format only (telephone).  All issues noted in this document were discussed and addressed.  No physical exam could be performed with this format.  Please refer to the patient's chart for his  consent to telehealth for [facility].   Patient has given verbal permission to conduct this visit via virtual appointment and to bill insurance 12/25/2019 1:10 PM     Evaluation Performed:  Follow-up visit  Date:  12/25/2019   ID:  Jacob Brady, DOB 06/21/1961, MRN [id number]  Patient Location: Home Provider Location: Office  PCP:  Cl[facility], [facility] - Dr. Jess Barters Cardiologist:  Bryan Lemma, MD  CT Surgeon: Dr. Renaldo Fiddler Electrophysiologist:  None   Chief Complaint:   Chief Complaint  Patient presents with  . Follow-up    2 months  . Coronary Artery Disease    History of CABG     History of Present Illness:   Jacob Brady is a 59 y.o. male with PMH notable for DM2 on Insulin (poorly controlled-A1c 15.5 with DKA on presentation), HTN, HLD, long-term smoker MDD/PTSD & h/o pancreatitis with recently Dx'd MV CAD in February 2021 when he presented to [facility] with SSx concerning for Unstable / Progressive Angina.  He presents via Web designer for a telehealth visit today for 3 month f/u.  Hospitalizations:   January 31-September 06, 2019: Admitted to internal medicine service with chest  pain and "suicidal ideation ".  Found to be in DKA that was treated by the medicine service.  I initially saw Jacob Brady for consultation on August 27, 2019 for evaluation of chest pain.  He is usually followed at the [facility], and was seen on January 14 with chest pain and possible suicidal ideation.  He was discharged and offered outpatient cardiology evaluation with stress test which he was not able to follow-up on because of transportation issues.  He had noted having chest pain since December 2020 that often woke him up at night, and with smoking.  Associated with right jaw and arm pain, but the chest discomfort is central chest ranging from 4-5/10 as much as 9/10.  Interestingly, he walked about a mile-1.36mile to the store twice a week carrying groceries etc..  No symptoms associated with that.  He did indicate that he walked relatively slow and noted significant shortness of breath if he walked faster.  Abnormal Myoview stress test--> cath with MV CAD--> CABG x5 -> somewhat prolonged postop course requiring pressors.  Also PTSD because some complaints.  Noted triglycerides of 800 post CABG  Jacob Brady has been seen twice by Dr. Renaldo Fiddler and once for in person evaluation at [facility] by Micah Flesher, PA and then telemedicine evaluation by Dr. Rennis Golden from [facility].  September 10, 2019-Dr. Adkins: Noted significant social issues including access to food.  Sister was not able to come up from [place] because of bad weather.  Otherwise doing well medically.  (Had been discharged with  medications) -> stopped Lasix  September 24, 2019-Dr. Atkins: Doing well.  No further chest pain or dyspnea.  Plan follow-up CVTS as needed.  Attempted to [facility] with [facility] list.  October 08, 2019-Angela Duke, PA--noted that he was doing very well.  No more edema after stopping Lasix.  Unable to afford transportation to cardiac rehab.  Walking 1.5-2 miles daily.  (Long  discussion about bypass surgery, ramifications, reviewed cath films etc.) Listed medications from [place]: ASA, plavix, toprol 50 mg daily, 80 mg lipitor, 30 mg imdur, 10 mg hydralazine BID, 10 mg amlodipine, 40 mg lisinopril --> no changes made.  Smoking cessation counseling (recommended patches) and diabetes control counseling.  October 25, 2019-Dr. Hilty by telemedicine-> noted dramatic improvement of hemoglobin A1c down from 14.8 in March down to 6.6 in late April.  Noted 45 pound weight loss since surgery.  Marked improvement in metabolic disorder.  Noted new cholesterol panel 10/08/2019: TC 137, TG 168, HDL 40, LDL 66 on 80 mg atorvastatin. ->  Follow-up with lipid clinic as needed.   Recent - Interim CV studies:    The following studies were reviewed today:  08/05/2019 CTA Abd-Pelvis   08/27/2019-Myoview: INTERMEDIATE RISK.  EF 45-50%.  Large area of inferior lateral wall infarct at mid had a basal level with small area of moderate apical ischemia.  Inferior lateral HK.  08/27/2019-Cath: Multivessel CAD: pRCA 95% ulcerated plaque (large dominant vessel; ~ostial LAD 75% & 90% D1, mLAD 40%; p-m LCx 85%, m LCx 95%, d LCx 90% & 50%.  EF 45-50%. --> CVTS consult (added atorvastatin 80 mg & fenofibrate along with BB & amlodipine)  Diagnostic -Dominance: Right    08/27/2019-Echo: EF 60 to 65%.  Moderate posterior thickness as well as severe septal LVH-no obvious R WMA.  GR 1 DD.  Normal atria.  Moderate aortic sclerosis but no stenosis (noted to be in A. fib)  08/30/2019-CABG x 5: LIMA-dLAD, pedRIMA-rPDA, SeqL-Rad-OM1-OM2, SVG-D1 ? Intra-Op TEE: EF 55-60%.  Moderate upper septal hypertrophy.  No R WMA.  Descending aortic atherosclerosis-grade 3, immobile plaque measuring 3-5 mm.Jacob Brady History   Jacob Brady is being seen for his 2nd post-CABG f/u.  He has been doing "pretty good", trying to walk 4-5 x per week.  Stinging sensation in chest when HR & BP goes up.  Also, gets tired after ~  100 yards - gets SOB - may get up to 3/4 mile.  Still having high BP readings despite no nicotine, caffeine / XS salt. No further CP like pre-CABG.   Cardiovascular ROS: positive for - dyspnea on exertion and exercise intolerance negative for - chest pain, edema, irregular heartbeat, loss of consciousness, orthopnea, palpitations, paroxysmal nocturnal dyspnea, rapid heart rate, shortness of breath or near syncope, lightheadedness, dizziness; TIA/ amaurosis fugax   - [facility] planning Abd [place] to reassess AAA (seen on CT Abd-Pelvis)  ROS:  Please see the history of present illness.    The patient does not have symptoms concerning for COVID-19 infection (fever, chills, cough, or new shortness of breath).  Review of Systems  Constitutional: Positive for malaise/fatigue. Negative for diaphoresis and weight loss (gained back ~ 10 lb from post-op loss).  HENT: Negative for congestion and nosebleeds.   Respiratory: Positive for shortness of breath. Negative for cough and wheezing.   Gastrointestinal: Negative for abdominal pain, blood in stool and melena.  Genitourinary: Negative for hematuria.  Musculoskeletal: Negative for falls and joint pain.  Neurological: Negative for dizziness, focal weakness, weakness  and headaches.  Psychiatric/Behavioral: Negative for memory loss and suicidal ideas. The patient is nervous/anxious. The patient does not have insomnia.     The patient is practicing social distancing. ++ Both Covid Vaccines - March-April Jacob Brady)  Past Medical History:  Diagnosis Date  . Arthritis   . Current every day smoker   . Essential hypertension    On multiple medications  . 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 Julien Girt)  . Hyperlipidemia LDL goal <70    10/08/2019: TC 137, TG 168, HDL 40, LDL 66 on 80 mg atorvastatin.  . Major depressive disorder   . Multiple vessel coronary artery disease 08/26/2018   Cath:  pRCA 95% ulcerated  plaque (large dominant vessel; ~ostial LAD 75% & 90% D1, mLAD 40%; p-m LCx 85%, m LCx 95%, d LCx 90% & 50%.  . Pancreatitis   . Poorly controlled diabetes mellitus ([place])  with history of DKA    A1c was 15.5 at time of CABG, most recently 6.6 (March 2021)  . PTSD (post-traumatic stress disorder)    Past Surgical History:  Procedure Laterality Date  . CHOLECYSTECTOMY    . CORONARY ARTERY BYPASS GRAFT N/A 08/30/2019   Procedure: CORONARY ARTERY BYPASS GRAFTING (CABG) x 5 using LIMA to LAD; RIMA to rPDA; endoscopic right greater saphenous vein harvest --> SVG- Diag1; and left radial harvest => SeqLRad-OM1 and OM2.;  Surgeon: Wonda Olds, MD;  Location: [facility];  Service: Open Heart Surgery;  Laterality: N/A;  BILATERAL IMA  . ENDOVEIN HARVEST OF GREATER SAPHENOUS VEIN Right 08/30/2019   Procedure: [place] Of Greater Saphenous Vein;  Surgeon: Wonda Olds, MD;  Location: [place];  Service: Open Heart Surgery;  Laterality: Right;  . LEFT HEART CATH AND CORONARY ANGIOGRAPHY N/A 08/27/2019   Procedure: LEFT HEART CATH AND CORONARY ANGIOGRAPHY;  Surgeon: Troy Sine, MD;  Location: [place] CV LAB::  pRCA 95% ulcerated plaque (large dominant vessel; ~ostial LAD 75% & 90% D1, mLAD 40%; p-m LCx 85%, m LCx 95%, d LCx 90% & 50%.. EF 45-50%. ==> CVTS Consult  . NM MYOVIEW LTD  08/26/2018   [place]: Progressive (atypical) Angina --> INTERMEDIATE RISK.  EF 45-50%.  Large area of inferior lateral wall infarct at mid had a basal level with small area of moderate apical ischemia.  Inferior lateral HK. --> referred for CATH -> CABG  . RADIAL ARTERY HARVEST Left 08/30/2019   Procedure: RADIAL ARTERY HARVEST;  Surgeon: Wonda Olds, MD;  Location: [place];  Service: Open Heart Surgery;  Laterality: Left;  . TEE WITHOUT CARDIOVERSION N/A 08/30/2019   Procedure: TRANSESOPHAGEAL ECHOCARDIOGRAM (TEE);  Surgeon: Wonda Olds, MD;  Location: [place];  Service: Open Heart Surgery;  Laterality: N/A;  .  TRANSTHORACIC ECHOCARDIOGRAM  08/28/2019   EF 60 to 65%.  Moderate posterior thickness as well as severe septal LVH-no obvious R WMA.  GR 1 DD.  Normal atria.  Moderate aortic sclerosis but no stenosis  . WRIST FRACTURE SURGERY Right      Current Meds  Medication Sig  . amLODipine (NORVASC) 10 MG tablet Take 10 mg by mouth daily.  Marland Kitchen aspirin EC 81 MG tablet Take 81 mg by mouth daily.  . busPIRone (BUSPAR) 10 MG tablet Take 10 mg by mouth 2 (two) times daily.   . clopidogrel (PLAVIX) 75 MG tablet Take 1 tablet (75 mg total) by mouth daily. Please send any refill request to  patient's [facility] -Dr Jess BartersBorum  . divalproex (DEPAKOTE) 500 MG DR tablet Take 1,000 mg by mouth at bedtime.  . gabapentin (NEURONTIN) 300 MG capsule Take 900 mg by mouth at bedtime.   . insulin NPH-regular Human (70-30) 100 UNIT/ML injection Inject 25 Units into the skin 2 (two) times daily with a meal.  . lisinopril (ZESTRIL) 40 MG tablet Take 40 mg by mouth daily.  . mirtazapine (REMERON) 30 MG tablet Take 1 tablet (30 mg total) by mouth at bedtime.  . traZODone (DESYREL) 150 MG tablet Take 200 mg by mouth at bedtime.  Marland Kitchen. venlafaxine (EFFEXOR) 75 MG tablet Take 150 mg by mouth every morning.   . [DISCONTINUED] hydrALAZINE (APRESOLINE) 10 MG tablet Take 10 mg by mouth in the morning and at bedtime.  . [DISCONTINUED] isosorbide mononitrate (IMDUR) 30 MG 24 hr tablet Take 1 tablet (30 mg total) by mouth daily. For 3 months then stop.  . [DISCONTINUED] metoprolol tartrate (LOPRESSOR) 25 MG tablet Take 37.5 mg by mouth 2 (two) times daily.  . [DISCONTINUED] rosuvastatin (CRESTOR) 20 MG tablet Take 10 mg by mouth daily.    [facility] MD changed Metoprolol 50 mg daily to 37.5 mg BID & from Atorvastatin 80 mg to Rosuvastatin 10 mg.   Allergies:   Hctz [hydrochlorothiazide] and Seroquel [quetiapine fumarate]   Social History   Tobacco Use  . Smoking status: Former Smoker    Packs/day: 1.50    Years: 35.00    Pack years: 52.50    Quit  date: 10/25/2019    Years since quitting: 0.1  . Smokeless tobacco: Never Used  Substance Use Topics  . Alcohol use: No  . Drug use: No     Family Hx: The patient's family history includes Aneurysm in his mother; Brain cancer (age of onset: 4052) in his father; Healthy in his brother, sister, sister, and sister.   Labs/Other Tests and Data Reviewed:    EKG:  No ECG reviewed.  Recent Labs: 08/05/2019: TSH 1.974 08/30/2019: B Natriuretic Peptide 59.6 09/02/2019: Magnesium 1.9 09/06/2019: Hemoglobin 8.1; Platelets 242 10/08/2019: ALT 9; BUN 12; Creatinine, Ser 1.03; Potassium 4.7; Sodium 141   Recent Lipid Panel  [facility] 3-[facility] wks [facility] Lab Results  Component Value Date/Time   CHOL 137 10/08/2019 04:31 PM   TRIG 168 (H) 10/08/2019 04:31 PM   HDL 43 10/08/2019 04:31 PM   CHOLHDL 3.2 10/08/2019 04:31 PM   CHOLHDL NOT REPORTED DUE TO HIGH TRIGLYCERIDES 08/27/2019 02:03 AM   LDLCALC 66 10/08/2019 04:31 PM   LDLDIRECT NOT CALCULATED 08/27/2019 02:03 AM    Wt Readings from Last 3 Encounters:  12/25/19 238 lb (108 kg)  10/25/19 238 lb (108 kg)  10/08/19 238 lb 6.4 oz (108.1 kg)     Objective:    Vital Signs:  BP (!) 157/94   Pulse 93   Ht 5\' 10"  (1.778 m)   Wt 238 lb (108 kg)   BMI 34.15 kg/m    BP 174/114 --> 151/98 VITAL SIGNS:  reviewed Pleasant male in NO acute distress. A&O x 3.  normal Mood & Affect Non-labored respirations   ASSESSMENT & PLAN:    Problem List Items Addressed This Visit    Unstable angina (HCC) (Chronic)    No longer having any angina symptoms post CABG.  He has a strange stinging sensation with sounds more like musculoskeletal discomfort.  I think he probably still has some microvascular disease related diastolic dysfunction.  Needs more aggressive blood pressure control.  Relevant Medications   aspirin EC 81 MG tablet   metoprolol tartrate (LOPRESSOR) 50 MG tablet   hydrALAZINE (APRESOLINE) 50 MG tablet   isosorbide mononitrate (IMDUR) 30  MG 24 hr tablet   rosuvastatin (CRESTOR) 20 MG tablet   S/P CABG x 5 (Chronic)    Seems to doing fairly well after CABG.  Edema is back to baseline.  He is still having what sounds like musculoskeletal symptoms of tightness.  Has basically been released by surgery.      Coronary artery disease involving native coronary artery of native heart with angina pectoris (HCC) - Primary (Chronic)    Unfortunately he went about a month and a half with progressively worsening symptoms.  Thankfully he never had a heart attack and has preserved EF on echo.  He is cleared evidence of hypertensive heart disease but also macro and microvascular disease as well.  Thankfully no further angina symptoms after CABG, but is still noting pretty significant fatigue and dyspnea with exertion.  Needs better blood pressure control.  Plan: Continue beta-blocker but increase to 50 mg twice daily.  Original intention was to discontinue Imdur, but with him having some strange chest discomfort and exertional dyspnea, will continue along with increased dose of hydralazine to 50 mg twice daily  Unless there is a very good reason from a lab standpoint I would prefer him to be on 20 mg rosuvastatin.  Continue  Aspirin/Plavix--however would be okay to hold water both 5 to 7 days preop for any surgeries or procedures      Relevant Medications   aspirin EC 81 MG tablet   metoprolol tartrate (LOPRESSOR) 50 MG tablet   hydrALAZINE (APRESOLINE) 50 MG tablet   isosorbide mononitrate (IMDUR) 30 MG 24 hr tablet   rosuvastatin (CRESTOR) 20 MG tablet   Essential hypertension (Chronic)    Poorly controlled on multiple medications.  However he is on strange dosage regimen.  He was supposed to have been on Toprol 50 mg when he left the hospital instead he was switched to Lopressor 50 once a day which did not work.  He subsequently has been put on 37 twice daily of Lopressor by his primary doctor.  Despite that his blood pressure is  still quite high.  Plan: Continue max dose of amlodipine plus lisinopril  Change Metoprolol to 50 mg 2 x daily (full tab)  Increase Hydralazine to 50 mg 2 x daily (until new Rx comes - take 2 of your existing tab 2 x daily)  Continue Imdur 30 mg  Would need diuretic to complete therapy--reassess after changes to avoid additional meds.  [facility] hypertension clinic follow-up in 3 to 4 weeks.      Relevant Medications   aspirin EC 81 MG tablet   metoprolol tartrate (LOPRESSOR) 50 MG tablet   hydrALAZINE (APRESOLINE) 50 MG tablet   isosorbide mononitrate (IMDUR) 30 MG 24 hr tablet   rosuvastatin (CRESTOR) 20 MG tablet   Diabetes mellitus type 2 in obese (HCC) (Chronic)    Being managed by the [place].  Financial issues are a major problem. He is on NPH 70-30 insulin only.  I suspect that he needs more control than that.  Will defer to primary physician.      Relevant Medications   aspirin EC 81 MG tablet   rosuvastatin (CRESTOR) 20 MG tablet   Abdominal aortic aneurysm (AAA) (HCC) (Chronic)    Apparently increased from 3.8 to 4.4 cm by CTA back in January.  Sounds like he  is can be scheduled for abdominal vascular ultrasound during this month.  Hopefully this slow progression with improved blood pressure and lipid control.  He is indicated that he would prefer to have any potential invasive procedures to treat this to be done here at [facility] as opposed to at the [place].  No need to see follows through with [facility] system.  I will be happy to put in referral to one of our vascular surgeons (Dr.Clark, Dr. Myra Gianotti, Dr. Darrick Penna, Dr. Arbie Cookey and Dr. Edilia Bo.)      Relevant Medications   aspirin EC 81 MG tablet   metoprolol tartrate (LOPRESSOR) 50 MG tablet   hydrALAZINE (APRESOLINE) 50 MG tablet   isosorbide mononitrate (IMDUR) 30 MG 24 hr tablet   rosuvastatin (CRESTOR) 20 MG tablet   Hyperlipidemia LDL goal <70 (Chronic)    Most recent check of lipids look pretty good back in March with LDL down  to 66.  Unfortunately he was taking 80 mg of atorvastatin at that time and now is taking 10 mg of rosuvastatin.  Not quite a one-to-one trade. Apparently he had labs checked about a month ago at the [place].  We will get those results.  Plan: I would like for him at least be on 20 mg of rosuvastatin. Get labs from the [place]      Relevant Medications   aspirin EC 81 MG tablet   metoprolol tartrate (LOPRESSOR) 50 MG tablet   hydrALAZINE (APRESOLINE) 50 MG tablet   isosorbide mononitrate (IMDUR) 30 MG 24 hr tablet   rosuvastatin (CRESTOR) 20 MG tablet      COVID-19 Education: The signs and symptoms of COVID-19 were discussed with the patient and how to seek care for testing (follow up with PCP or arrange E-visit).   The importance of social distancing was discussed today.  Time:   Today, I have spent 42 minutes with the patient with telehealth technology discussing the above problems.  15 min charting - extensive chart review.   Medication Adjustments/Labs and Tests Ordered: Current medicines are reviewed at length with the patient today.  Concerns regarding medicines are outlined above.   Patient Instructions  Medication Instructions:    Change Metoprolol to 50 mg 2 x daily (full tab)  Increase Hydralazine to 50 mg 2 x daily (until new Rx comes - take 2 of your existing tab 2 x daily)  Continue Imdur 30 mg  Take full 20 mg Rosuvastatin  *If you need a refill on your cardiac medications before your next appointment, please call your pharmacy*   Lab Work:  - we will need to get your labs from [place].   If you have labs (blood work) drawn today and your tests are completely normal, you will receive your results only by: Marland Kitchen MyChart Message (if you have MyChart) OR . A paper copy in the mail If you have any lab test that is abnormal or we need to change your treatment, we will call you to review the results.   Testing/Procedures:  none  Follow-Up: At [facility], you and your  health needs are our priority.  As part of our continuing mission to provide you with exceptional heart care, we have created designated Provider Care Teams.  These Care Teams include your primary Cardiologist (physician) and Advanced Practice Providers (APPs -  Physician Assistants and Nurse Practitioners) who all work together to provide you with the care you need, when you need it.  We recommend signing up for the patient portal  called "MyChart".  Sign up information is provided on this After Visit Summary.  MyChart is used to connect with patients for Virtual Visits (Telemedicine).  Patients are able to view lab/test results, encounter notes, upcoming appointments, etc.  Non-urgent messages can be sent to your provider as well.   To learn more about what you can do with MyChart, go to [email or web address].      Your next appointment with MD: 3 month(s)  The format for your next appointment:   In Person  Provider:   Bryan Lemma, MD   Your next appointment: Will be a follow-up appointment with clinical pharmacist and cardiovascular risk reduction clinic to follow-up blood pressure readings in 3-4 weeks.  Other Instructions Keep slowly increasing activity.  Continue to follow-up with the abdominal aortic aneurysm evaluation through the [place].  When it comes time to considering potential procedures to treat this, I will be happy to put in a referral to our vascular surgeons if the [place] will be willing to do that.  Bryan Lemma, MD      Signed, Bryan Lemma, MD  12/25/2019 1:10 PM    [facility]

## 2019-12-25 ENCOUNTER — Telehealth: Payer: Self-pay | Admitting: *Deleted

## 2019-12-25 ENCOUNTER — Telehealth: Payer: Self-pay | Admitting: Cardiology

## 2019-12-25 ENCOUNTER — Telehealth (INDEPENDENT_AMBULATORY_CARE_PROVIDER_SITE_OTHER): Payer: No Typology Code available for payment source | Admitting: Cardiology

## 2019-12-25 ENCOUNTER — Encounter: Payer: Self-pay | Admitting: Cardiology

## 2019-12-25 VITALS — BP 157/94 | HR 93 | Ht 70.0 in | Wt 238.0 lb

## 2019-12-25 DIAGNOSIS — E669 Obesity, unspecified: Secondary | ICD-10-CM

## 2019-12-25 DIAGNOSIS — I10 Essential (primary) hypertension: Secondary | ICD-10-CM

## 2019-12-25 DIAGNOSIS — I25119 Atherosclerotic heart disease of native coronary artery with unspecified angina pectoris: Secondary | ICD-10-CM

## 2019-12-25 DIAGNOSIS — Z951 Presence of aortocoronary bypass graft: Secondary | ICD-10-CM

## 2019-12-25 DIAGNOSIS — I714 Abdominal aortic aneurysm, without rupture, unspecified: Secondary | ICD-10-CM

## 2019-12-25 DIAGNOSIS — E1169 Type 2 diabetes mellitus with other specified complication: Secondary | ICD-10-CM

## 2019-12-25 DIAGNOSIS — E785 Hyperlipidemia, unspecified: Secondary | ICD-10-CM

## 2019-12-25 DIAGNOSIS — I200 Unstable angina: Secondary | ICD-10-CM

## 2019-12-25 MED ORDER — METOPROLOL TARTRATE 50 MG PO TABS
50.0000 mg | ORAL_TABLET | Freq: Two times a day (BID) | ORAL | 3 refills | Status: DC
Start: 2019-12-25 — End: 2020-10-14

## 2019-12-25 MED ORDER — ROSUVASTATIN CALCIUM 20 MG PO TABS
20.0000 mg | ORAL_TABLET | Freq: Every day | ORAL | 3 refills | Status: DC
Start: 2019-12-25 — End: 2020-10-12

## 2019-12-25 MED ORDER — ISOSORBIDE MONONITRATE ER 30 MG PO TB24
30.0000 mg | ORAL_TABLET | Freq: Every day | ORAL | 3 refills | Status: DC
Start: 2019-12-25 — End: 2020-10-12

## 2019-12-25 MED ORDER — HYDRALAZINE HCL 50 MG PO TABS
50.0000 mg | ORAL_TABLET | Freq: Two times a day (BID) | ORAL | 3 refills | Status: DC
Start: 2019-12-25 — End: 2020-10-12

## 2019-12-25 NOTE — Assessment & Plan Note (Signed)
No longer having any angina symptoms post CABG.  He has a strange stinging sensation with sounds more like musculoskeletal discomfort.  I think he probably still has some microvascular disease related diastolic dysfunction.  Needs more aggressive blood pressure control.

## 2019-12-25 NOTE — Assessment & Plan Note (Signed)
Poorly controlled on multiple medications.  However he is on strange dosage regimen.  He was supposed to have been on Toprol 50 mg when he left the hospital instead he was switched to Lopressor 50 once a day which did not work.  He subsequently has been put on 37 twice daily of Lopressor by his primary doctor.  Despite that his blood pressure is still quite high.  Plan: Continue max dose of amlodipine plus lisinopril  Change Metoprolol to 50 mg 2 x daily (full tab)  Increase Hydralazine to 50 mg 2 x daily (until new Rx comes - take 2 of your existing tab 2 x daily)  Continue Imdur 30 mg  Would need diuretic to complete therapy--reassess after changes to avoid additional meds.  [facility] hypertension clinic follow-up in 3 to 4 weeks.

## 2019-12-25 NOTE — Assessment & Plan Note (Signed)
Apparently increased from 3.8 to 4.4 cm by CTA back in January.  Sounds like he is can be scheduled for abdominal vascular ultrasound during this month.  Hopefully this slow progression with improved blood pressure and lipid control.  He is indicated that he would prefer to have any potential invasive procedures to treat this to be done here at [facility] as opposed to at the [place].  No need to see follows through with [facility] system.  I will be happy to put in referral to one of our vascular surgeons (Dr.Clark, Dr. Myra Gianotti, Dr. Darrick Penna, Dr. Arbie Cookey and Dr. Edilia Bo.)

## 2019-12-25 NOTE — Assessment & Plan Note (Signed)
Seems to doing fairly well after CABG.  Edema is back to baseline.  He is still having what sounds like musculoskeletal symptoms of tightness.  Has basically been released by surgery.

## 2019-12-25 NOTE — Assessment & Plan Note (Signed)
Unfortunately he went about a month and a half with progressively worsening symptoms.  Thankfully he never had a heart attack and has preserved EF on echo.  He is cleared evidence of hypertensive heart disease but also macro and microvascular disease as well.  Thankfully no further angina symptoms after CABG, but is still noting pretty significant fatigue and dyspnea with exertion.  Needs better blood pressure control.  Plan: Continue beta-blocker but increase to 50 mg twice daily.  Original intention was to discontinue Imdur, but with him having some strange chest discomfort and exertional dyspnea, will continue along with increased dose of hydralazine to 50 mg twice daily  Unless there is a very good reason from a lab standpoint I would prefer him to be on 20 mg rosuvastatin.  Continue  Aspirin/Plavix--however would be okay to hold water both 5 to 7 days preop for any surgeries or procedures

## 2019-12-25 NOTE — Telephone Encounter (Signed)
Patient called back.  Mr Jacob Brady states he receive message  From earlier today. He was aware of the changes with medications.   Mr Jacob Brady states he is not financially able to come to the office at the present time. He does not have acar and [facility].   patient is willing to do virtual visit if needed.     RN informed patient will inform Dr Herbie Baltimore . RN informed patient there maybe a patient fund that may can assist with with one or both appointment .  awaiting on  Answer and will contact patient.

## 2019-12-25 NOTE — Telephone Encounter (Signed)
Patient returned call to [facility]. Call successfully transferred to [facility].

## 2019-12-25 NOTE — Assessment & Plan Note (Signed)
Being managed by the [place].  Financial issues are a major problem. He is on NPH 70-30 insulin only.  I suspect that he needs more control than that.  Will defer to primary physician.

## 2019-12-25 NOTE — Assessment & Plan Note (Signed)
Most recent check of lipids look pretty good back in March with LDL down to 66.  Unfortunately he was taking 80 mg of atorvastatin at that time and now is taking 10 mg of rosuvastatin.  Not quite a one-to-one trade. Apparently he had labs checked about a month ago at the [place].  We will get those results.  Plan: I would like for him at least be on 20 mg of rosuvastatin. Get labs from the [place]

## 2019-12-25 NOTE — Patient Instructions (Addendum)
Medication Instructions:    Change Metoprolol to 50 mg 2 x daily (full tab)  Increase Hydralazine to 50 mg 2 x daily (until new Rx comes - take 2 of your existing tab 2 x daily)  Continue Imdur 30 mg  Take full 20 mg Rosuvastatin  *If you need a refill on your cardiac medications before your next appointment, please call your pharmacy*   Lab Work:  - we will need to get your labs from [place].   If you have labs (blood work) drawn today and your tests are completely normal, you will receive your results only by: Marland Kitchen MyChart Message (if you have MyChart) OR . A paper copy in the mail If you have any lab test that is abnormal or we need to change your treatment, we will call you to review the results.   Testing/Procedures:  none  Follow-Up: At [facility], you and your health needs are our priority.  As part of our continuing mission to provide you with exceptional heart care, we have created designated Provider Care Teams.  These Care Teams include your primary Cardiologist (physician) and Advanced Practice Providers (APPs -  Physician Assistants and Nurse Practitioners) who all work together to provide you with the care you need, when you need it.  We recommend signing up for the patient portal called "MyChart".  Sign up information is provided on this After Visit Summary.  MyChart is used to connect with patients for Virtual Visits (Telemedicine).  Patients are able to view lab/test results, encounter notes, upcoming appointments, etc.  Non-urgent messages can be sent to your provider as well.   To learn more about what you can do with MyChart, go to [email or web address].      Your next appointment with MD: 3 month(s)  The format for your next appointment:   In Person  Provider:   Bryan Lemma, MD   Your next appointment: Will be a follow-up appointment with clinical pharmacist and cardiovascular risk reduction clinic to follow-up blood pressure readings in 3-4  weeks.  Other Instructions Keep slowly increasing activity.  Continue to follow-up with the abdominal aortic aneurysm evaluation through the [place].  When it comes time to considering potential procedures to treat this, I will be happy to put in a referral to our vascular surgeons if the [place] will be willing to do that.  Bryan Lemma, MD

## 2019-12-25 NOTE — Telephone Encounter (Signed)
RN called patient -  RN got thevoicemail . Instruction were given  from today's virtual visit 12/25/19 detailed on voicemail.  AVS SUMMARY has been mailed.  [facility] [place] [facility]  Received [facility]#. Will fax office note once completed.   Prescription e-sent .  [facility] appt schedule for 01/31/20 at 3:30 pm   f/u appt with Dr Herbie Baltimore in the office 03/26/20 at 10 am . If unable make appt may call th reschedule to another time or/and date.    any question may call back

## 2019-12-25 NOTE — Telephone Encounter (Signed)
  Patient Consent for Virtual Visit         Jacob Brady has provided verbal consent on 12/25/2019 for a virtual visit (video or telephone).   CONSENT FOR VIRTUAL VISIT FOR:  Jacob Brady  By participating in this virtual visit I agree to the following:  I hereby voluntarily request, consent and authorize [facility] and its employed or contracted physicians, physician assistants, nurse practitioners or other licensed health care professionals (the Practitioner), to provide me with telemedicine health care services (the "Services") as deemed necessary by the treating Practitioner. I acknowledge and consent to receive the Services by the Practitioner via telemedicine. I understand that the telemedicine visit will involve communicating with the Practitioner through live audiovisual communication technology and the disclosure of certain medical information by electronic transmission. I acknowledge that I have been given the opportunity to request an in-person assessment or other available alternative prior to the telemedicine visit and am voluntarily participating in the telemedicine visit.  I understand that I have the right to withhold or withdraw my consent to the use of telemedicine in the course of my care at any time, without affecting my right to future care or treatment, and that the Practitioner or I may terminate the telemedicine visit at any time. I understand that I have the right to inspect all information obtained and/or recorded in the course of the telemedicine visit and may receive copies of available information for a reasonable fee.  I understand that some of the potential risks of receiving the Services via telemedicine include:  Marland Kitchen Delay or interruption in medical evaluation due to technological equipment failure or disruption; . Information transmitted may not be sufficient (e.g. poor resolution of images) to allow for appropriate medical decision making by the  Practitioner; and/or  . In rare instances, security protocols could fail, causing a breach of personal health information.  Furthermore, I acknowledge that it is my responsibility to provide information about my medical history, conditions and care that is complete and accurate to the best of my ability. I acknowledge that Practitioner's advice, recommendations, and/or decision may be based on factors not within their control, such as incomplete or inaccurate data provided by me or distortions of diagnostic images or specimens that may result from electronic transmissions. I understand that the practice of medicine is not an exact science and that Practitioner makes no warranties or guarantees regarding treatment outcomes. I acknowledge that a copy of this consent can be made available to me via my patient portal [facility] MyChart), or I can request a printed copy by calling the office of [facility].    I understand that my insurance will be billed for this visit.   I have read or had this consent read to me. . I understand the contents of this consent, which adequately explains the benefits and risks of the Services being provided via telemedicine.  . I have been provided ample opportunity to ask questions regarding this consent and the Services and have had my questions answered to my satisfaction. . I give my informed consent for the services to be provided through the use of telemedicine in my medical care

## 2019-12-26 NOTE — Telephone Encounter (Signed)
I guess we will have to do virtual visits then.    Hopefully, he can figure out some type of transportation.  Bryan Lemma, MD

## 2019-12-27 NOTE — Telephone Encounter (Signed)
Spoke to patient - informed patient will switch sept appt to virtual .  patient is in agreement.  appointment changed to sept 2, 2021 at 8 am

## 2019-12-27 NOTE — Telephone Encounter (Signed)
Spoke with patient about getting transportation assistance and informed him that will will start the process once we are a little closer to his appointment time which is July 8th at 330pm with the pharmacy. I will call him on July 1st to get everything situated for transportation.

## 2020-01-10 ENCOUNTER — Telehealth: Payer: Self-pay | Admitting: Cardiology

## 2020-01-10 NOTE — Telephone Encounter (Signed)
[employer] for Med Records on this patient - lost phone call.

## 2020-01-15 ENCOUNTER — Telehealth: Payer: Self-pay | Admitting: Cardiology

## 2020-01-15 NOTE — Telephone Encounter (Signed)
LM2CB-which records does he need sent?? Please add to message if pt calls back

## 2020-01-15 NOTE — Telephone Encounter (Signed)
Patient called and would like Dr. Herbie Baltimore to fax his records to the [facility]. Please send records via fax # [phone]  Pt would like a confirmation call once the records have been faxed

## 2020-01-18 NOTE — Telephone Encounter (Signed)
Spoke with pt, they sent a release and the records have been received.

## 2020-01-23 ENCOUNTER — Emergency Department (HOSPITAL_COMMUNITY)
Admission: EM | Admit: 2020-01-23 | Discharge: 2020-01-23 | Disposition: A | Discharge status: Home / Self Care | Payer: No Typology Code available for payment source | Attending: Emergency Medicine | Admitting: Emergency Medicine

## 2020-01-23 ENCOUNTER — Telehealth: Payer: Self-pay | Admitting: Physician Assistant

## 2020-01-23 ENCOUNTER — Emergency Department (HOSPITAL_COMMUNITY): Payer: No Typology Code available for payment source

## 2020-01-23 ENCOUNTER — Encounter (HOSPITAL_COMMUNITY): Payer: Self-pay | Admitting: Emergency Medicine

## 2020-01-23 DIAGNOSIS — E119 Type 2 diabetes mellitus without complications: Secondary | ICD-10-CM | POA: Insufficient documentation

## 2020-01-23 DIAGNOSIS — Z7982 Long term (current) use of aspirin: Secondary | ICD-10-CM | POA: Diagnosis not present

## 2020-01-23 DIAGNOSIS — Z79899 Other long term (current) drug therapy: Secondary | ICD-10-CM | POA: Insufficient documentation

## 2020-01-23 DIAGNOSIS — I10 Essential (primary) hypertension: Secondary | ICD-10-CM | POA: Insufficient documentation

## 2020-01-23 DIAGNOSIS — R109 Unspecified abdominal pain: Secondary | ICD-10-CM | POA: Diagnosis present

## 2020-01-23 DIAGNOSIS — Z951 Presence of aortocoronary bypass graft: Secondary | ICD-10-CM | POA: Insufficient documentation

## 2020-01-23 DIAGNOSIS — I714 Abdominal aortic aneurysm, without rupture, unspecified: Secondary | ICD-10-CM

## 2020-01-23 DIAGNOSIS — Z87891 Personal history of nicotine dependence: Secondary | ICD-10-CM | POA: Insufficient documentation

## 2020-01-23 DIAGNOSIS — Z7902 Long term (current) use of antithrombotics/antiplatelets: Secondary | ICD-10-CM | POA: Diagnosis not present

## 2020-01-23 DIAGNOSIS — R1013 Epigastric pain: Secondary | ICD-10-CM

## 2020-01-23 DIAGNOSIS — I2510 Atherosclerotic heart disease of native coronary artery without angina pectoris: Secondary | ICD-10-CM | POA: Insufficient documentation

## 2020-01-23 LAB — CBC
HCT: 46.9 % (ref 39.0–52.0)
Hemoglobin: 15.4 g/dL (ref 13.0–17.0)
MCH: 28.7 pg (ref 26.0–34.0)
MCHC: 32.8 g/dL (ref 30.0–36.0)
MCV: 87.3 fL (ref 80.0–100.0)
Platelets: 217 10*3/uL (ref 150–400)
RBC: 5.37 MIL/uL (ref 4.22–5.81)
RDW: 14.7 % (ref 11.5–15.5)
WBC: 8.3 10*3/uL (ref 4.0–10.5)
nRBC: 0 % (ref 0.0–0.2)

## 2020-01-23 LAB — URINALYSIS, ROUTINE W REFLEX MICROSCOPIC
Bacteria, UA: NONE SEEN
Bilirubin Urine: NEGATIVE
Glucose, UA: >=500 mg/dL — AB
Hgb urine dipstick: NEGATIVE
Ketones, ur: 5 mg/dL — AB
Leukocytes,Ua: NEGATIVE
Nitrite: NEGATIVE
Protein, ur: NEGATIVE mg/dL
Specific Gravity, Urine: 1.015 (ref 1.005–1.030)
pH: 8 (ref 5.0–8.0)

## 2020-01-23 LAB — HEPATIC FUNCTION PANEL
ALT: 22 U/L (ref 0–44)
AST: 18 U/L (ref 15–41)
Albumin: 3.8 g/dL (ref 3.5–5.0)
Alkaline Phosphatase: 51 U/L (ref 38–126)
Bilirubin, Direct: <0.1 mg/dL (ref 0.0–0.2)
Total Bilirubin: 0.2 mg/dL — ABNORMAL LOW (ref 0.3–1.2)
Total Protein: 7.6 g/dL (ref 6.5–8.1)

## 2020-01-23 LAB — BASIC METABOLIC PANEL
Anion gap: 13 (ref 5–15)
BUN: 12 mg/dL (ref 6–20)
CO2: 21 mmol/L — ABNORMAL LOW (ref 22–32)
Calcium: 9.9 mg/dL (ref 8.9–10.3)
Chloride: 102 mmol/L (ref 98–111)
Creatinine, Ser: 0.77 mg/dL (ref 0.61–1.24)
GFR calc Af Amer: >60 mL/min (ref >60)
GFR calc non Af Amer: >60 mL/min (ref >60)
Glucose, Bld: 292 mg/dL — ABNORMAL HIGH (ref 70–99)
Potassium: 3.7 mmol/L (ref 3.5–5.1)
Sodium: 136 mmol/L (ref 135–145)

## 2020-01-23 LAB — TYPE AND SCREEN
ABO/RH(D): A POS
Antibody Screen: NEGATIVE

## 2020-01-23 LAB — LIPASE, BLOOD: Lipase: 32 U/L (ref 11–51)

## 2020-01-23 LAB — CBG MONITORING, ED: Glucose-Capillary: 255 mg/dL — ABNORMAL HIGH (ref 70–99)

## 2020-01-23 LAB — PROTIME-INR
INR: 0.9 (ref 0.8–1.2)
Prothrombin Time: 11.9 seconds (ref 11.4–15.2)

## 2020-01-23 LAB — TROPONIN I (HIGH SENSITIVITY)
Troponin I (High Sensitivity): 8 ng/L (ref <18)
Troponin I (High Sensitivity): 12 ng/L (ref <18)

## 2020-01-23 MED ORDER — ESMOLOL HCL-SODIUM CHLORIDE 2000 MG/100ML IV SOLN
25.0000 ug/kg/min | INTRAVENOUS | Status: DC
  Administered 2020-01-23: 25 ug/kg/min via INTRAVENOUS
  Filled 2020-01-23: qty 100

## 2020-01-23 MED ORDER — IOHEXOL 350 MG/ML SOLN
100.0000 mL | Freq: Once | INTRAVENOUS | Status: AC | PRN
  Administered 2020-01-23: 100 mL via INTRAVENOUS

## 2020-01-23 MED ORDER — HYDROMORPHONE HCL 1 MG/ML IJ SOLN
1.0000 mg | Freq: Once | INTRAMUSCULAR | Status: AC
  Administered 2020-01-23: 1 mg via INTRAVENOUS
  Filled 2020-01-23: qty 1

## 2020-01-23 MED ORDER — ALUM & MAG HYDROXIDE-SIMETH 200-200-20 MG/5ML PO SUSP
30.0000 mL | Freq: Once | ORAL | Status: DC
  Filled 2020-01-23: qty 30

## 2020-01-23 MED ORDER — FAMOTIDINE IN NACL 20-0.9 MG/50ML-% IV SOLN
20.0000 mg | Freq: Once | INTRAVENOUS | Status: DC
  Filled 2020-01-23: qty 50

## 2020-01-23 MED ORDER — SODIUM CHLORIDE 0.9% FLUSH: 3.0000 mL | Freq: Once | INTRAVENOUS | Status: DC

## 2020-01-23 MED ORDER — ONDANSETRON HCL 4 MG/2ML IJ SOLN
4.0000 mg | Freq: Once | INTRAMUSCULAR | Status: AC
  Administered 2020-01-23: 4 mg via INTRAVENOUS
  Filled 2020-01-23: qty 2

## 2020-01-23 NOTE — ED Notes (Signed)
Back from CT

## 2020-01-23 NOTE — ED Notes (Signed)
Off floor to CT 

## 2020-01-23 NOTE — Telephone Encounter (Signed)
Patient called after hours with 7 days hx of intermittent sharp pain "above belly button and below rib cage" however for the past 3 days it has become more severe and intense. Denies radiation.  Now mostly constant. Poor PO intake but denies vomiting or nausea. BP running high last reading was 198/125. No chest pain.  He has hx of Abdominal aortic aneurysm  And has schedule for study next week at [place]. He has called his doctor at [place] 2 days ago due to worsen pain but hasn't heard yet.   I have recommended him to come to [facility] ER for further evaluation and r/o rupture. However, he is worried about wait time.  I have encouraged him to call EMS. He will come to ER.

## 2020-01-23 NOTE — ED Triage Notes (Addendum)
Patient from home with [facility] for near syncope and abdominal pain, patient reports he has a AAA measured 4.4 cm in December 2020 and that they plan surgical intervention if it grows greater than 5 cm. Patient very anxious, following commands, answering questions appropriately.

## 2020-01-23 NOTE — Discharge Instructions (Signed)
You have a 4.5 cm aortic aneurysm. Please follow-up with your vascular surgeon for further evaluation. Your abdominal pain today appears to be related to indigestion and reflux. Please start taking omeprazole, available over-the-counter once daily.

## 2020-01-23 NOTE — ED Provider Notes (Signed)
[facility] Provider Note   CSN: [id number] Arrival date & time: 01/23/20  1859     History Chief Complaint  Patient presents with  . Near Syncope    Jacob Brady is a 59 y.o. male.  The history is provided by the patient and medical records. No language interpreter was used.  Near Syncope   Jacob Brady is a 59 y.o. male who presents to the Emergency Department complaining of abdominal pain. He presents the emergency department for evaluation of severe central abdominal pain that began a few days ago. He describes it as sharp and burning in nature with waxing and waning episodes throughout the weekend. Today the pain became significantly worse. He has associated nausea today as well as the feeling like he might pass out. He denies any fevers, chest pain, vomiting, diarrhea. He does have associated shortness of breath. He has a known AAA that was 4.4 cm in December.    Past Medical History:  Diagnosis Date  . Arthritis   . Current every day smoker   . Essential hypertension    On multiple medications  . 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 Renaldo FiddlerAdkins)  . Hyperlipidemia LDL goal <70    10/08/2019: TC 137, TG 168, HDL 40, LDL 66 on 80 mg atorvastatin.  . Major depressive disorder   . Multiple vessel coronary artery disease 08/26/2018   Cath:  pRCA 95% ulcerated plaque (large dominant vessel; ~ostial LAD 75% & 90% D1, mLAD 40%; p-m LCx 85%, m LCx 95%, d LCx 90% & 50%.  . Pancreatitis   . Poorly controlled diabetes mellitus (HCC)  with history of DKA    A1c was 15.5 at time of CABG, most recently 6.6 (March 2021)  . PTSD (post-traumatic stress disorder)     Patient Active Problem List   Diagnosis Date Noted  . Hyperlipidemia LDL goal <70   . S/P CABG x 5 08/30/2019  . Suicide ideation 08/27/2019  . Unstable angina (HCC)   . Back pain 08/06/2019  . DKA, type 2 (HCC)  08/05/2019  . History of pancreatitis 08/05/2019  . Severe recurrent major depression without psychotic features (HCC) 01/03/2019  . MDD (major depressive disorder), recurrent episode, severe (HCC) 01/03/2019  . Coronary artery disease involving native coronary artery of native heart with angina pectoris (HCC) 08/26/2018  . Essential hypertension 08/03/2015  . Diabetes mellitus type 2 in obese (HCC) 08/03/2015  . PTSD (post-traumatic stress disorder) 08/03/2015  . Abdominal aortic aneurysm (AAA) (HCC) 08/03/2015    Past Surgical History:  Procedure Laterality Date  . CHOLECYSTECTOMY    . CORONARY ARTERY BYPASS GRAFT N/A 08/30/2019   Procedure: CORONARY ARTERY BYPASS GRAFTING (CABG) x 5 using LIMA to LAD; RIMA to rPDA; endoscopic right greater saphenous vein harvest --> SVG- Diag1; and left radial harvest => SeqLRad-OM1 and OM2.;  Surgeon: Linden DolinAtkins, Broadus Z, MD;  Location: [facility] OR;  Service: Open Heart Surgery;  Laterality: N/A;  BILATERAL IMA  . ENDOVEIN HARVEST OF GREATER SAPHENOUS VEIN Right 08/30/2019   Procedure: Mack GuiseEndovein Harvest Of Greater Saphenous Vein;  Surgeon: Linden DolinAtkins, Broadus Z, MD;  [facility];  Service: Open Heart Surgery;  Laterality: Right;  . LEFT HEART CATH AND CORONARY ANGIOGRAPHY N/A 08/27/2019   Procedure: LEFT HEART CATH AND CORONARY ANGIOGRAPHY;  Surgeon: Lennette BihariKelly, Thomas A, MD;  Location: [facility] INVASIVE CV LAB::  pRCA 95% ulcerated plaque (large dominant vessel; ~ostial LAD 75% &  90% D1, mLAD 40%; p-m LCx 85%, m LCx 95%, d LCx 90% & 50%.. EF 45-50%. ==> CVTS Consult  . NM MYOVIEW LTD  08/26/2018   [place]: Progressive (atypical) Angina --> INTERMEDIATE RISK.  EF 45-50%.  Large area of inferior lateral wall infarct at mid had a basal level with small area of moderate apical ischemia.  Inferior lateral HK. --> referred for CATH -> CABG  . RADIAL ARTERY HARVEST Left 08/30/2019   Procedure: RADIAL ARTERY HARVEST;  Surgeon: Linden Dolin, MD;  Location: [facility] OR;  Service: Open Heart  Surgery;  Laterality: Left;  . TEE WITHOUT CARDIOVERSION N/A 08/30/2019   Procedure: TRANSESOPHAGEAL ECHOCARDIOGRAM (TEE);  Surgeon: Linden Dolin, MD;  Location[facility] OR;  Service: Open Heart Surgery;  Laterality: N/A;  . TRANSTHORACIC ECHOCARDIOGRAM  08/28/2019   EF 60 to 65%.  Moderate posterior thickness as well as severe septal LVH-no obvious R WMA.  GR 1 DD.  Normal atria.  Moderate aortic sclerosis but no stenosis  . WRIST FRACTURE SURGERY Right        Family History  Problem Relation Age of Onset  . Aneurysm Mother        Brain  . Brain cancer Father 70  . Healthy Sister   . Healthy Brother   . Healthy Sister   . Healthy Sister     Social History   Tobacco Use  . Smoking status: Former Smoker    Packs/day: 1.50    Years: 35.00    Pack years: 52.50    Quit date: 10/25/2019    Years since quitting: 0.2  . Smokeless tobacco: Never Used  Vaping Use  . Vaping Use: Never used  Substance Use Topics  . Alcohol use: No  . Drug use: No    Home Medications Prior to Admission medications   Medication Sig Start Date End Date Taking? Authorizing Provider  amLODipine (NORVASC) 10 MG tablet Take 10 mg by mouth daily.    [provider]  aspirin EC 81 MG tablet Take 81 mg by mouth daily.    [provider]  busPIRone (BUSPAR) 10 MG tablet Take 10 mg by mouth 2 (two) times daily.     [provider]  clopidogrel (PLAVIX) 75 MG tablet Take 1 tablet (75 mg total) by mouth daily. Please send any refill request to [facility]Dr Jess Barters 09/27/19   Linden Dolin, MD  divalproex (DEPAKOTE) 500 MG DR tablet Take 1,000 mg by mouth at bedtime.    [provider]  gabapentin (NEURONTIN) 300 MG capsule Take 900 mg by mouth at bedtime.     [provider]  hydrALAZINE (APRESOLINE) 50 MG tablet Take 1 tablet (50 mg total) by mouth 2 (two) times daily. 12/25/19 03/24/20  Marykay Lex, MD  insulin NPH-regular Human (70-30) 100 UNIT/ML injection  Inject 25 Units into the skin 2 (two) times daily with a meal.    [provider]  isosorbide mononitrate (IMDUR) 30 MG 24 hr tablet Take 1 tablet (30 mg total) by mouth daily. 12/25/19 03/24/20  Marykay Lex, MD  lisinopril (ZESTRIL) 40 MG tablet Take 40 mg by mouth daily.    [provider]  metoprolol tartrate (LOPRESSOR) 50 MG tablet Take 1 tablet (50 mg total) by mouth 2 (two) times daily. 12/25/19 03/24/20  Marykay Lex, MD  mirtazapine (REMERON) 30 MG tablet Take 1 tablet (30 mg total) by mouth at bedtime. 01/08/19   Aldean Baker, NP  rosuvastatin (CRESTOR) 20 MG tablet Take 1 tablet (20 mg total) by mouth daily. 12/25/19 03/24/20  Marykay Lex, MD  traZODone (DESYREL) 150 MG tablet Take 200 mg by mouth at bedtime.    [provider]  venlafaxine (EFFEXOR) 75 MG tablet Take 150 mg by mouth every morning.     [provider]    Allergies    Hctz [hydrochlorothiazide] and Seroquel [quetiapine fumarate]  Review of Systems   Review of Systems  Cardiovascular: Positive for near-syncope.  All other systems reviewed and are negative.   Physical Exam Updated Vital Signs BP (!) 169/85   Pulse 70   Temp 98.2 F (36.8 C) (Oral)   Resp (!) 21   Ht 5\' 10"  (1.778 m)   Wt 122.5 kg   SpO2 98%   BMI 38.74 kg/m   Physical Exam Vitals and nursing note reviewed.  Constitutional:      Appearance: He is well-developed.  HENT:     Head: Normocephalic and atraumatic.  Cardiovascular:     Rate and Rhythm: Normal rate and regular rhythm.     Heart sounds: No murmur heard.   Pulmonary:     Effort: Pulmonary effort is normal. No respiratory distress.     Breath sounds: Normal breath sounds.  Abdominal:     Palpations: Abdomen is soft.     Tenderness: There is no guarding or rebound.     Comments: Moderate generalized abdominal tenderness with voluntary guarding.  Musculoskeletal:        General: No swelling or tenderness.     Comments: 2+ radial  and DP pulses bilaterally  Skin:    General: Skin is warm and dry.  Neurological:     Mental Status: He is alert and oriented to person, place, and time.  Psychiatric:     Comments: Anxious appearing     ED Results / Procedures / Treatments   Labs (all labs ordered are listed, but only abnormal results are displayed) Labs Reviewed  BASIC METABOLIC PANEL - Abnormal; Notable for the following components:      Result Value   CO2 21 (*)    Glucose, Bld 292 (*)    All other components within normal limits  URINALYSIS, ROUTINE W REFLEX MICROSCOPIC - Abnormal; Notable for the following components:   Glucose, UA >=500 (*)    Ketones, ur 5 (*)    All other components within normal limits  HEPATIC FUNCTION PANEL - Abnormal; Notable for the following components:   Total Bilirubin 0.2 (*)    All other components within normal limits  CBG MONITORING, ED - Abnormal; Notable for the following components:   Glucose-Capillary 255 (*)    All other components within normal limits  SARS CORONAVIRUS 2 BY RT PCR (HOSPITAL ORDER, PERFORMED IN [facility])  CBC  LIPASE, BLOOD  PROTIME-INR  TYPE AND SCREEN  TROPONIN I (HIGH SENSITIVITY)  TROPONIN I (HIGH SENSITIVITY)    EKG EKG Interpretation  Date/Time:  Wednesday January 23 2020 19:05:26 EDT Ventricular Rate:  98 PR Interval:    QRS Duration: 162 QT Interval:  410 QTC Calculation: 524 R Axis:   -71 Text Interpretation: Sinus rhythm RBBB Confirmed by 09[birth year]1978 [phone]) on 01/23/2020 7:45:05 PM   Radiology CT Angio Chest/Abd/Pel for Dissection W and/or W/WO  Result Date: 01/23/2020 CLINICAL DATA:  Chest pain, back pain EXAM: CT ANGIOGRAPHY CHEST, ABDOMEN AND PELVIS TECHNIQUE: Non-contrast CT of the chest was initially obtained. Multidetector CT imaging through the  chest, abdomen and pelvis was performed using the standard protocol during bolus administration of intravenous contrast. Multiplanar reconstructed images and MIPs  were obtained and reviewed to evaluate the vascular anatomy. CONTRAST:  OMNIPAQUE IOHEXOL 350 MG/ML SOLN COMPARISON:  08/28/2019 FINDINGS: CTA CHEST FINDINGS Cardiovascular: Prior CABG. Aortic atherosclerosis. Heart is normal size. Aorta is normal caliber. No aortic dissection. No filling defects in the pulmonary arteries to suggest pulmonary emboli. Mediastinum/Nodes: No mediastinal, hilar, or axillary adenopathy. Trachea and esophagus are unremarkable. Thyroid unremarkable. Lungs/Pleura: Areas of scarring in the left lung. No confluent opacities or effusions. Musculoskeletal: Chest wall soft tissues are unremarkable. No acute bony abnormality. Review of the MIP images confirms the above findings. CTA ABDOMEN AND PELVIS FINDINGS VASCULAR Aorta: Atherosclerotic calcified and noncalcified irregular plaque. 4.5 cm infrarenal abdominal aortic aneurysm. No dissection. Celiac: Patent without evidence of aneurysm, dissection, vasculitis or significant stenosis. SMA: Patent without evidence of aneurysm, dissection, vasculitis or significant stenosis. Renals: Both renal arteries are patent without evidence of aneurysm, dissection, vasculitis, fibromuscular dysplasia or significant stenosis. IMA: Patent. Inflow: Calcifications.  No aneurysm or dissection. Veins: No obvious venous abnormality within the limitations of this arterial phase study. Review of the MIP images confirms the above findings. NON-VASCULAR Hepatobiliary: Suspect diffuse fatty infiltration of the liver. Prior cholecystectomy. Pancreas: No focal abnormality or ductal dilatation. Spleen: No focal abnormality.  Normal size. Adrenals/Urinary Tract: No adrenal abnormality. No focal renal abnormality. No stones or hydronephrosis. Urinary bladder is unremarkable. Stomach/Bowel: Left colonic diverticulosis. No active diverticulitis. No evidence of bowel obstruction. Lymphatic: No adenopathy. Reproductive: No visible focal abnormality. Other: No free fluid or  free air. Small umbilical hernia containing fat. Musculoskeletal: No acute bony abnormality. Review of the MIP images confirms the above findings. IMPRESSION: 4.5 cm infrarenal abdominal aortic aneurysm. Recommend followup by abdomen and pelvis CTA in 6 months, and vascular surgery referral/consultation if not already obtained. This recommendation follows ACR consensus guidelines: White Paper of the ACR Incidental Findings Committee II on Vascular Findings. J Am Coll Radiol 2013; 10:789-794. Aortic aneurysm NOS (ICD10-I71.9) Atherosclerotic irregularity and calcifications. Irregular calcified and noncalcified plaque in the upper abdominal aorta. No evidence of aortic dissection. No acute cardiopulmonary disease.  Prior CABG. Suspect diffuse fatty infiltration of the liver. Left colonic diverticulosis. Small umbilical hernia containing fat. Electronically Signed   By: Charlett Nose M.D.   On: 01/23/2020 20:45    Procedures Procedures (including critical care time) CRITICAL CARE Performed by: Tilden Fossa   Total critical care time: 45 minutes  Critical care time was exclusive of separately billable procedures and treating other patients.  Critical care was necessary to treat or prevent imminent or life-threatening deterioration.  Critical care was time spent personally by me on the following activities: development of treatment plan with patient and/or surrogate as well as nursing, discussions with consultants, evaluation of patient's response to treatment, examination of patient, obtaining history from patient or surrogate, ordering and performing treatments and interventions, ordering and review of laboratory studies, ordering and review of radiographic studies, pulse oximetry and re-evaluation of patient's condition.  Medications Ordered in ED Medications  sodium chloride flush (NS) 0.9 % injection 3 mL (has no administration in time range)  famotidine (PEPCID) IVPB 20 mg premix (20 mg  Intravenous Not Given 01/23/20 2257)  alum & mag hydroxide-simeth (MAALOX/MYLANTA) 200-200-20 MG/5ML suspension 30 mL (30 mLs Oral Not Given 01/23/20 2257)  HYDROmorphone (DILAUDID) injection 1 mg (1 mg Intravenous Given 01/23/20 1925)  ondansetron (ZOFRAN) injection 4 mg (4  mg Intravenous Given 01/23/20 1923)  iohexol (OMNIPAQUE) 350 MG/ML injection 100 mL (100 mLs Intravenous Contrast Given 01/23/20 2028)    ED Course  I have reviewed the triage vital signs and the nursing notes.  Pertinent labs & imaging results that were available during my care of the patient were reviewed by me and considered in my medical decision making (see chart for details).    MDM Rules/Calculators/A&P                         Patient with history of coronary artery disease, abdominal aortic aneurysm here for evaluation of severe abdominal pain. Patient hypertensive and in distress on ED arrival, anxious with abdominal tenderness. Initial concern for expanding aortic aneurysm versus dissection. He was treated with pain control, Esmolol for heart rate control. A CTA was obtained, which demonstrates a stable aneurysm with no evidence of dissection. On repeat assessment patient states his pain is significantly improved. He also reports that he has been having increased reflux lately. Discussed with patient likely indigestion/gastritis that are contributing to his symptoms and recommend starting PPI. Discussed importance of outpatient follow-up and return precautions. Final Clinical Impression(s) / ED Diagnoses Final diagnoses:  Epigastric pain  Abdominal aortic aneurysm (AAA) without rupture [facility])    Rx / DC Orders ED Discharge Orders    None       Tilden Fossa, MD 01/23/20 2339

## 2020-01-31 ENCOUNTER — Ambulatory Visit: Payer: No Typology Code available for payment source

## 2020-03-26 ENCOUNTER — Ambulatory Visit: Payer: No Typology Code available for payment source | Admitting: Cardiology

## 2020-03-27 ENCOUNTER — Telehealth: Payer: No Typology Code available for payment source | Admitting: Cardiology

## 2020-03-28 ENCOUNTER — Telehealth: Payer: No Typology Code available for payment source | Admitting: Cardiology

## 2020-10-11 ENCOUNTER — Inpatient Hospital Stay (HOSPITAL_COMMUNITY)
Admission: EM | Admit: 2020-10-11 | Discharge: 2020-10-14 | DRG: 287 | Disposition: A | Discharge status: Home / Self Care | Payer: No Typology Code available for payment source | Attending: Internal Medicine | Admitting: Internal Medicine

## 2020-10-11 ENCOUNTER — Encounter (HOSPITAL_COMMUNITY): Payer: Self-pay

## 2020-10-11 ENCOUNTER — Other Ambulatory Visit: Payer: Self-pay

## 2020-10-11 DIAGNOSIS — Z794 Long term (current) use of insulin: Secondary | ICD-10-CM

## 2020-10-11 DIAGNOSIS — I25110 Atherosclerotic heart disease of native coronary artery with unstable angina pectoris: Secondary | ICD-10-CM | POA: Diagnosis not present

## 2020-10-11 DIAGNOSIS — Z79899 Other long term (current) drug therapy: Secondary | ICD-10-CM

## 2020-10-11 DIAGNOSIS — I452 Bifascicular block: Secondary | ICD-10-CM | POA: Diagnosis present

## 2020-10-11 DIAGNOSIS — I200 Unstable angina: Secondary | ICD-10-CM | POA: Diagnosis present

## 2020-10-11 DIAGNOSIS — F329 Major depressive disorder, single episode, unspecified: Secondary | ICD-10-CM | POA: Diagnosis present

## 2020-10-11 DIAGNOSIS — E1169 Type 2 diabetes mellitus with other specified complication: Secondary | ICD-10-CM | POA: Diagnosis present

## 2020-10-11 DIAGNOSIS — I10 Essential (primary) hypertension: Secondary | ICD-10-CM | POA: Diagnosis present

## 2020-10-11 DIAGNOSIS — E785 Hyperlipidemia, unspecified: Secondary | ICD-10-CM | POA: Diagnosis present

## 2020-10-11 DIAGNOSIS — R079 Chest pain, unspecified: Secondary | ICD-10-CM | POA: Diagnosis not present

## 2020-10-11 DIAGNOSIS — Z87891 Personal history of nicotine dependence: Secondary | ICD-10-CM

## 2020-10-11 DIAGNOSIS — Z20822 Contact with and (suspected) exposure to covid-19: Secondary | ICD-10-CM | POA: Diagnosis present

## 2020-10-11 DIAGNOSIS — E7800 Pure hypercholesterolemia, unspecified: Secondary | ICD-10-CM | POA: Diagnosis present

## 2020-10-11 DIAGNOSIS — Z9049 Acquired absence of other specified parts of digestive tract: Secondary | ICD-10-CM

## 2020-10-11 DIAGNOSIS — I25119 Atherosclerotic heart disease of native coronary artery with unspecified angina pectoris: Secondary | ICD-10-CM | POA: Diagnosis present

## 2020-10-11 DIAGNOSIS — Z888 Allergy status to other drugs, medicaments and biological substances status: Secondary | ICD-10-CM

## 2020-10-11 DIAGNOSIS — Z7982 Long term (current) use of aspirin: Secondary | ICD-10-CM

## 2020-10-11 DIAGNOSIS — Z951 Presence of aortocoronary bypass graft: Secondary | ICD-10-CM

## 2020-10-11 DIAGNOSIS — Z6837 Body mass index (BMI) 37.0-37.9, adult: Secondary | ICD-10-CM

## 2020-10-11 DIAGNOSIS — Z7902 Long term (current) use of antithrombotics/antiplatelets: Secondary | ICD-10-CM

## 2020-10-11 DIAGNOSIS — E119 Type 2 diabetes mellitus without complications: Secondary | ICD-10-CM | POA: Diagnosis present

## 2020-10-11 DIAGNOSIS — E669 Obesity, unspecified: Secondary | ICD-10-CM | POA: Diagnosis present

## 2020-10-11 NOTE — ED Triage Notes (Signed)
Patient arrives from home with chest pain, has extensive cardiac hx, reports intermittent chest pain x a few weeks becoming more frequent, is scheduled for stress test with cardiologist this week but pain is getting worse.   EMS vital 180/100 inital 136/88 after 4 SL Nitro 98% RA HR 80 RBBB 324 mg ASA

## 2020-10-12 ENCOUNTER — Emergency Department (HOSPITAL_COMMUNITY): Payer: No Typology Code available for payment source

## 2020-10-12 DIAGNOSIS — I10 Essential (primary) hypertension: Secondary | ICD-10-CM

## 2020-10-12 DIAGNOSIS — E785 Hyperlipidemia, unspecified: Secondary | ICD-10-CM | POA: Diagnosis present

## 2020-10-12 DIAGNOSIS — I25119 Atherosclerotic heart disease of native coronary artery with unspecified angina pectoris: Secondary | ICD-10-CM | POA: Diagnosis not present

## 2020-10-12 DIAGNOSIS — I2583 Coronary atherosclerosis due to lipid rich plaque: Secondary | ICD-10-CM

## 2020-10-12 DIAGNOSIS — Z794 Long term (current) use of insulin: Secondary | ICD-10-CM | POA: Diagnosis not present

## 2020-10-12 DIAGNOSIS — I25110 Atherosclerotic heart disease of native coronary artery with unstable angina pectoris: Secondary | ICD-10-CM | POA: Diagnosis present

## 2020-10-12 DIAGNOSIS — Z87891 Personal history of nicotine dependence: Secondary | ICD-10-CM | POA: Diagnosis not present

## 2020-10-12 DIAGNOSIS — I200 Unstable angina: Secondary | ICD-10-CM

## 2020-10-12 DIAGNOSIS — R079 Chest pain, unspecified: Secondary | ICD-10-CM | POA: Diagnosis present

## 2020-10-12 DIAGNOSIS — Z6837 Body mass index (BMI) 37.0-37.9, adult: Secondary | ICD-10-CM | POA: Diagnosis not present

## 2020-10-12 DIAGNOSIS — F329 Major depressive disorder, single episode, unspecified: Secondary | ICD-10-CM | POA: Diagnosis present

## 2020-10-12 DIAGNOSIS — E7800 Pure hypercholesterolemia, unspecified: Secondary | ICD-10-CM | POA: Diagnosis present

## 2020-10-12 DIAGNOSIS — Z9049 Acquired absence of other specified parts of digestive tract: Secondary | ICD-10-CM | POA: Diagnosis not present

## 2020-10-12 DIAGNOSIS — Z79899 Other long term (current) drug therapy: Secondary | ICD-10-CM | POA: Diagnosis not present

## 2020-10-12 DIAGNOSIS — Z888 Allergy status to other drugs, medicaments and biological substances status: Secondary | ICD-10-CM | POA: Diagnosis not present

## 2020-10-12 DIAGNOSIS — Z20822 Contact with and (suspected) exposure to covid-19: Secondary | ICD-10-CM | POA: Diagnosis present

## 2020-10-12 DIAGNOSIS — Z7902 Long term (current) use of antithrombotics/antiplatelets: Secondary | ICD-10-CM | POA: Diagnosis not present

## 2020-10-12 DIAGNOSIS — Z951 Presence of aortocoronary bypass graft: Secondary | ICD-10-CM | POA: Diagnosis not present

## 2020-10-12 DIAGNOSIS — Z7982 Long term (current) use of aspirin: Secondary | ICD-10-CM | POA: Diagnosis not present

## 2020-10-12 DIAGNOSIS — E119 Type 2 diabetes mellitus without complications: Secondary | ICD-10-CM

## 2020-10-12 DIAGNOSIS — I2510 Atherosclerotic heart disease of native coronary artery without angina pectoris: Secondary | ICD-10-CM

## 2020-10-12 DIAGNOSIS — E669 Obesity, unspecified: Secondary | ICD-10-CM | POA: Diagnosis present

## 2020-10-12 DIAGNOSIS — I452 Bifascicular block: Secondary | ICD-10-CM | POA: Diagnosis present

## 2020-10-12 LAB — BASIC METABOLIC PANEL
Anion gap: 11 (ref 5–15)
Anion gap: 9 (ref 5–15)
BUN: 13 mg/dL (ref 6–20)
BUN: 12 mg/dL (ref 6–20)
CO2: 22 mmol/L (ref 22–32)
CO2: 24 mmol/L (ref 22–32)
Calcium: 9.8 mg/dL (ref 8.9–10.3)
Calcium: 9.4 mg/dL (ref 8.9–10.3)
Chloride: 103 mmol/L (ref 98–111)
Chloride: 101 mmol/L (ref 98–111)
Creatinine, Ser: 0.69 mg/dL (ref 0.61–1.24)
Creatinine, Ser: 0.78 mg/dL (ref 0.61–1.24)
GFR, Estimated: >60 mL/min (ref >60)
GFR, Estimated: >60 mL/min (ref >60)
Glucose, Bld: 144 mg/dL — ABNORMAL HIGH (ref 70–99)
Glucose, Bld: 125 mg/dL — ABNORMAL HIGH (ref 70–99)
Potassium: 3.6 mmol/L (ref 3.5–5.1)
Potassium: 3.7 mmol/L (ref 3.5–5.1)
Sodium: 136 mmol/L (ref 135–145)
Sodium: 134 mmol/L — ABNORMAL LOW (ref 135–145)

## 2020-10-12 LAB — CBC
HCT: 43.3 % (ref 39.0–52.0)
HCT: 44.5 % (ref 39.0–52.0)
Hemoglobin: 14.4 g/dL (ref 13.0–17.0)
Hemoglobin: 15.1 g/dL (ref 13.0–17.0)
MCH: 29.6 pg (ref 26.0–34.0)
MCH: 29.8 pg (ref 26.0–34.0)
MCHC: 33.3 g/dL (ref 30.0–36.0)
MCHC: 33.9 g/dL (ref 30.0–36.0)
MCV: 87.8 fL (ref 80.0–100.0)
MCV: 89.1 fL (ref 80.0–100.0)
Platelets: 206 10*3/uL (ref 150–400)
Platelets: 223 10*3/uL (ref 150–400)
RBC: 4.86 MIL/uL (ref 4.22–5.81)
RBC: 5.07 MIL/uL (ref 4.22–5.81)
RDW: 15 % (ref 11.5–15.5)
RDW: 15.1 % (ref 11.5–15.5)
WBC: 11.1 10*3/uL — ABNORMAL HIGH (ref 4.0–10.5)
WBC: 11.6 10*3/uL — ABNORMAL HIGH (ref 4.0–10.5)
nRBC: 0 % (ref 0.0–0.2)
nRBC: 0 % (ref 0.0–0.2)

## 2020-10-12 LAB — HEMOGLOBIN A1C
Hgb A1c MFr Bld: 7.5 % — ABNORMAL HIGH (ref 4.8–5.6)
Mean Plasma Glucose: 168.55 mg/dL

## 2020-10-12 LAB — LIPID PANEL
Cholesterol: 125 mg/dL (ref 0–200)
HDL: 33 mg/dL — ABNORMAL LOW (ref >40)
LDL Cholesterol: 30 mg/dL (ref 0–99)
Total CHOL/HDL Ratio: 3.8 RATIO
Triglycerides: 310 mg/dL — ABNORMAL HIGH (ref <150)
VLDL: 62 mg/dL — ABNORMAL HIGH (ref 0–40)

## 2020-10-12 LAB — HEPARIN LEVEL (UNFRACTIONATED)
Heparin Unfractionated: 0.14 IU/mL — ABNORMAL LOW (ref 0.30–0.70)
Heparin Unfractionated: 0.2 IU/mL — ABNORMAL LOW (ref 0.30–0.70)
Heparin Unfractionated: 0.15 IU/mL — ABNORMAL LOW (ref 0.30–0.70)

## 2020-10-12 LAB — TROPONIN I (HIGH SENSITIVITY)
Troponin I (High Sensitivity): 11 ng/L (ref <18)
Troponin I (High Sensitivity): 9 ng/L (ref <18)

## 2020-10-12 LAB — HIV ANTIBODY (ROUTINE TESTING W REFLEX): HIV Screen 4th Generation wRfx: NONREACTIVE

## 2020-10-12 LAB — GLUCOSE, CAPILLARY
Glucose-Capillary: 132 mg/dL — ABNORMAL HIGH (ref 70–99)
Glucose-Capillary: 128 mg/dL — ABNORMAL HIGH (ref 70–99)

## 2020-10-12 LAB — CBG MONITORING, ED
Glucose-Capillary: 155 mg/dL — ABNORMAL HIGH (ref 70–99)
Glucose-Capillary: 116 mg/dL — ABNORMAL HIGH (ref 70–99)

## 2020-10-12 LAB — SARS CORONAVIRUS 2 (TAT 6-24 HRS): SARS Coronavirus 2: NEGATIVE

## 2020-10-12 MED ORDER — NITROGLYCERIN 0.4 MG SL SUBL
0.4000 mg | SUBLINGUAL_TABLET | SUBLINGUAL | Status: AC | PRN
  Administered 2020-10-12 (×3): 0.4 mg via SUBLINGUAL
  Filled 2020-10-12 (×2): qty 1

## 2020-10-12 MED ORDER — DIVALPROEX SODIUM ER 500 MG PO TB24
1000.0000 mg | ORAL_TABLET | Freq: Every day | ORAL | Status: DC
  Administered 2020-10-12 – 2020-10-13 (×2): 1000 mg via ORAL
  Filled 2020-10-12 (×2): qty 2

## 2020-10-12 MED ORDER — HYDRALAZINE HCL 50 MG PO TABS
100.0000 mg | ORAL_TABLET | Freq: Three times a day (TID) | ORAL | Status: DC
  Administered 2020-10-12 – 2020-10-14 (×5): 100 mg via ORAL
  Filled 2020-10-12 (×6): qty 2

## 2020-10-12 MED ORDER — METFORMIN HCL 500 MG PO TABS: 1000.0000 mg | ORAL_TABLET | Freq: Two times a day (BID) | ORAL | Status: DC

## 2020-10-12 MED ORDER — VITAMIN D 25 MCG (1000 UNIT) PO TABS
2000.0000 [IU] | ORAL_TABLET | Freq: Every day | ORAL | Status: DC
  Administered 2020-10-12 – 2020-10-14 (×3): 2000 [IU] via ORAL
  Filled 2020-10-12 (×3): qty 2

## 2020-10-12 MED ORDER — ASPIRIN 81 MG PO CHEW
81.0000 mg | CHEWABLE_TABLET | ORAL | Status: AC
  Administered 2020-10-13: 81 mg via ORAL
  Filled 2020-10-12: qty 1

## 2020-10-12 MED ORDER — HEPARIN BOLUS VIA INFUSION
4000.0000 [IU] | Freq: Once | INTRAVENOUS | Status: AC
  Administered 2020-10-12: 4000 [IU] via INTRAVENOUS
  Filled 2020-10-12: qty 4000

## 2020-10-12 MED ORDER — SODIUM CHLORIDE 0.9% FLUSH: 3.0000 mL | INTRAVENOUS | Status: DC | PRN

## 2020-10-12 MED ORDER — ROSUVASTATIN CALCIUM 20 MG PO TABS
20.0000 mg | ORAL_TABLET | Freq: Every day | ORAL | Status: DC
  Administered 2020-10-12 – 2020-10-14 (×3): 20 mg via ORAL
  Filled 2020-10-12 (×3): qty 1

## 2020-10-12 MED ORDER — CLONAZEPAM 0.5 MG PO TABS
0.5000 mg | ORAL_TABLET | Freq: Two times a day (BID) | ORAL | Status: DC | PRN
  Administered 2020-10-12 – 2020-10-13 (×2): 0.5 mg via ORAL
  Filled 2020-10-12 (×2): qty 1

## 2020-10-12 MED ORDER — HEPARIN BOLUS VIA INFUSION
1000.0000 [IU] | Freq: Once | INTRAVENOUS | Status: AC
  Administered 2020-10-12: 1000 [IU] via INTRAVENOUS
  Filled 2020-10-12: qty 1000

## 2020-10-12 MED ORDER — CLOPIDOGREL BISULFATE 75 MG PO TABS
75.0000 mg | ORAL_TABLET | Freq: Every day | ORAL | Status: DC
  Administered 2020-10-12 – 2020-10-14 (×3): 75 mg via ORAL
  Filled 2020-10-12 (×3): qty 1

## 2020-10-12 MED ORDER — ONDANSETRON HCL 4 MG/2ML IJ SOLN: 4.0000 mg | Freq: Four times a day (QID) | INTRAMUSCULAR | Status: DC | PRN

## 2020-10-12 MED ORDER — ACETAMINOPHEN 325 MG PO TABS
650.0000 mg | ORAL_TABLET | ORAL | Status: DC | PRN
  Administered 2020-10-13: 650 mg via ORAL
  Filled 2020-10-12: qty 2

## 2020-10-12 MED ORDER — METOPROLOL TARTRATE 50 MG PO TABS
50.0000 mg | ORAL_TABLET | Freq: Two times a day (BID) | ORAL | Status: DC
  Administered 2020-10-12 – 2020-10-14 (×3): 50 mg via ORAL
  Filled 2020-10-12 (×3): qty 1
  Filled 2020-10-12: qty 2

## 2020-10-12 MED ORDER — ASPIRIN EC 81 MG PO TBEC
81.0000 mg | DELAYED_RELEASE_TABLET | Freq: Every day | ORAL | Status: DC
  Administered 2020-10-12 – 2020-10-14 (×3): 81 mg via ORAL
  Filled 2020-10-12 (×3): qty 1

## 2020-10-12 MED ORDER — INSULIN ASPART 100 UNIT/ML ~~LOC~~ SOLN
0.0000 [IU] | Freq: Three times a day (TID) | SUBCUTANEOUS | Status: DC
  Administered 2020-10-12: 3 [IU] via SUBCUTANEOUS
  Administered 2020-10-14: 5 [IU] via SUBCUTANEOUS

## 2020-10-12 MED ORDER — TRAZODONE HCL 100 MG PO TABS
200.0000 mg | ORAL_TABLET | Freq: Every day | ORAL | Status: DC
  Administered 2020-10-12 – 2020-10-13 (×2): 200 mg via ORAL
  Filled 2020-10-12 (×2): qty 2

## 2020-10-12 MED ORDER — MIRTAZAPINE 7.5 MG PO TABS
7.5000 mg | ORAL_TABLET | Freq: Every day | ORAL | Status: DC
  Filled 2020-10-12 (×2): qty 1

## 2020-10-12 MED ORDER — SODIUM CHLORIDE 0.9% FLUSH
3.0000 mL | Freq: Two times a day (BID) | INTRAVENOUS | Status: DC
  Administered 2020-10-12: 3 mL via INTRAVENOUS

## 2020-10-12 MED ORDER — VENLAFAXINE HCL ER 150 MG PO CP24
150.0000 mg | ORAL_CAPSULE | Freq: Every day | ORAL | Status: DC
  Administered 2020-10-12 – 2020-10-14 (×3): 150 mg via ORAL
  Filled 2020-10-12 (×3): qty 1

## 2020-10-12 MED ORDER — AMLODIPINE BESYLATE 10 MG PO TABS
10.0000 mg | ORAL_TABLET | Freq: Every day | ORAL | Status: DC
  Administered 2020-10-12 – 2020-10-14 (×3): 10 mg via ORAL
  Filled 2020-10-12: qty 2
  Filled 2020-10-12 (×2): qty 1

## 2020-10-12 MED ORDER — ISOSORBIDE MONONITRATE ER 60 MG PO TB24
120.0000 mg | ORAL_TABLET | Freq: Every day | ORAL | Status: DC
  Administered 2020-10-12 – 2020-10-14 (×3): 120 mg via ORAL
  Filled 2020-10-12: qty 4
  Filled 2020-10-12 (×2): qty 2

## 2020-10-12 MED ORDER — GABAPENTIN 300 MG PO CAPS
900.0000 mg | ORAL_CAPSULE | Freq: Every day | ORAL | Status: DC
  Administered 2020-10-12 – 2020-10-13 (×2): 900 mg via ORAL
  Filled 2020-10-12 (×2): qty 3

## 2020-10-12 MED ORDER — SODIUM CHLORIDE 0.9 % WEIGHT BASED INFUSION
3.0000 mL/kg/h | INTRAVENOUS | Status: DC
  Administered 2020-10-13: 3 mL/kg/h via INTRAVENOUS

## 2020-10-12 MED ORDER — SODIUM CHLORIDE 0.9 % WEIGHT BASED INFUSION
1.0000 mL/kg/h | INTRAVENOUS | Status: DC
  Administered 2020-10-13 (×2): 1 mL/kg/h via INTRAVENOUS

## 2020-10-12 MED ORDER — INSULIN ASPART 100 UNIT/ML ~~LOC~~ SOLN: 0.0000 [IU] | Freq: Every day | SUBCUTANEOUS | Status: DC

## 2020-10-12 MED ORDER — LISINOPRIL 40 MG PO TABS
40.0000 mg | ORAL_TABLET | Freq: Every day | ORAL | Status: DC
  Administered 2020-10-12 – 2020-10-14 (×3): 40 mg via ORAL
  Filled 2020-10-12 (×2): qty 1
  Filled 2020-10-12: qty 2

## 2020-10-12 MED ORDER — HEPARIN (PORCINE) 25000 UT/250ML-% IV SOLN
1800.0000 [IU]/h | INTRAVENOUS | Status: DC
  Administered 2020-10-12: 1700 [IU]/h via INTRAVENOUS
  Administered 2020-10-12: 1400 [IU]/h via INTRAVENOUS
  Administered 2020-10-13: 1800 [IU]/h via INTRAVENOUS
  Filled 2020-10-12 (×4): qty 250

## 2020-10-12 MED ORDER — METFORMIN HCL 500 MG PO TABS
1000.0000 mg | ORAL_TABLET | Freq: Two times a day (BID) | ORAL | Status: DC
  Administered 2020-10-12: 1000 mg via ORAL
  Filled 2020-10-12: qty 2

## 2020-10-12 MED ORDER — SODIUM CHLORIDE 0.9 % IV SOLN: 250.0000 mL | INTRAVENOUS | Status: DC | PRN

## 2020-10-12 MED ORDER — BUSPIRONE HCL 5 MG PO TABS
10.0000 mg | ORAL_TABLET | Freq: Two times a day (BID) | ORAL | Status: DC
  Administered 2020-10-12 – 2020-10-14 (×3): 10 mg via ORAL
  Filled 2020-10-12: qty 1
  Filled 2020-10-12 (×3): qty 2

## 2020-10-12 NOTE — Progress Notes (Signed)
ANTICOAGULATION CONSULT NOTE - Initial Consult  Pharmacy Consult for Heparin Indication: chest pain/ACS  Allergies  Allergen Reactions  . Hctz [Hydrochlorothiazide] Other (See Comments)    Unknown rxn, "they just told me not to take it anymore"  . Lurasidone     Other reaction(s): Dystonia  . Seroquel [Quetiapine Fumarate] Other (See Comments)    Elevates liver enzymes     Patient Measurements: Height: 5\' 10"  (177.8 cm) Weight: 117.9 kg (260 lb) IBW/kg (Calculated) : 73 Heparin Dosing Weight: 99 kg  Vital Signs: Temp: 98.3 F (36.8 C) (03/20 1937) Temp Source: Oral (03/20 1937) BP: 155/89 (03/20 1937) Pulse Rate: 71 (03/20 1937)  Labs: Recent Labs    10/11/20 2359 10/12/20 0140 10/12/20 0346 10/12/20 1254 10/12/20 1436 10/12/20 1924  HGB 15.1  --  14.4  --   --   --   HCT 44.5  --  43.3  --   --   --   PLT 223  --  206  --   --   --   HEPARINUNFRC  --   --   --  0.14* 0.20* 0.15*  CREATININE 0.69  --  0.78  --   --   --   TROPONINIHS 11 9  --   --   --   --     Estimated Creatinine Clearance: 131.1 mL/min (by C-G formula based on SCr of 0.78 mg/dL).   Medical History: Past Medical History:  Diagnosis Date  . Arthritis   . Current every day smoker   . Essential hypertension    On multiple medications  . 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 10/28/2019)  . Hyperlipidemia LDL goal <70    10/08/2019: TC 137, TG 168, HDL 40, LDL 66 on 80 mg atorvastatin.  . Major depressive disorder   . Multiple vessel coronary artery disease 08/26/2018   Cath:  pRCA 95% ulcerated plaque (large dominant vessel; ~ostial LAD 75% & 90% D1, mLAD 40%; p-m LCx 85%, m LCx 95%, d LCx 90% & 50%.  . Pancreatitis   . Poorly controlled diabetes mellitus (HCC)  with history of DKA    A1c was 15.5 at time of CABG, most recently 6.6 (March 2021)  . PTSD (post-traumatic stress disorder)       Assessment: 60 y.o. M presents with UA. No  anticoagulation prior to admission. Pharmacy consulted for heparin.    HL still subtherapeutic at 0.15. Patient had recurrent chest pain this afternoon. Per RN, no chest pain now but heparin bag ran out for about 30 min at shift change.  No bleeding per RN.   Goal of Therapy:  Heparin level 0.3-0.7 units/ml Monitor platelets by anticoagulation protocol: Yes   Plan:  Give Heparin 1000 unit bolus and increase heparin gtt to 1800 units/hr Monitor daily HL, CBC/plt Monitor for signs/symptoms of bleeding  F/u heart cath  60, PharmD, BCPS, [facility] Clinical Pharmacist  Please check AMION for all [facility] Pharmacy phone numbers After 10:00 PM, call Main Pharmacy [phone]

## 2020-10-12 NOTE — Progress Notes (Addendum)
ANTICOAGULATION CONSULT NOTE - Initial Consult  Pharmacy Consult for Heparin Indication: chest pain/ACS  Allergies  Allergen Reactions  . Hctz [Hydrochlorothiazide] Other (See Comments)    Unknown rxn, "they just told me not to take it anymore"  . Lurasidone     Other reaction(s): Dystonia  . Seroquel [Quetiapine Fumarate] Other (See Comments)    Elevates liver enzymes     Patient Measurements: Height: 5\' 10"  (177.8 cm) Weight: 117.9 kg (260 lb) IBW/kg (Calculated) : 73 Heparin Dosing Weight: 99 kg  Vital Signs: BP: 156/92 (03/20 1030) Pulse Rate: 62 (03/20 1030)  Labs: Recent Labs    10/11/20 2359 10/12/20 0140 10/12/20 0346 10/12/20 1254  HGB 15.1  --  14.4  --   HCT 44.5  --  43.3  --   PLT 223  --  206  --   HEPARINUNFRC  --   --   --  0.14*  CREATININE 0.69  --  0.78  --   TROPONINIHS 11 9  --   --     Estimated Creatinine Clearance: 131.1 mL/min (by C-G formula based on SCr of 0.78 mg/dL).   Medical History: Past Medical History:  Diagnosis Date  . Arthritis   . Current every day smoker   . Essential hypertension    On multiple medications  . 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 10/28/2019)  . Hyperlipidemia LDL goal <70    10/08/2019: TC 137, TG 168, HDL 40, LDL 66 on 80 mg atorvastatin.  . Major depressive disorder   . Multiple vessel coronary artery disease 08/26/2018   Cath:  pRCA 95% ulcerated plaque (large dominant vessel; ~ostial LAD 75% & 90% D1, mLAD 40%; p-m LCx 85%, m LCx 95%, d LCx 90% & 50%.  . Pancreatitis   . Poorly controlled diabetes mellitus (HCC)  with history of DKA    A1c was 15.5 at time of CABG, most recently 6.6 (March 2021)  . PTSD (post-traumatic stress disorder)      Medications:  See electronic med rec  Assessment: 60 y.o. M presents with CP. To begin heparin for r/o ACS. CBC ok on admission. No AC PTA.   Currently on IV heparin at 1500 units/hr. HL is subtherapeutic  at 0.14. H/H and Plt wnl. No s/s of overt bleeding per RN.   Goal of Therapy:  Heparin level 0.3-0.7 units/ml Monitor platelets by anticoagulation protocol: Yes   Plan:  Increase heparin gtt to 1700 units/hr Will f/u heparin level in 6 hours Daily heparin level and CBC  60, PharmD., BCPS, BCCCP Clinical Pharmacist Please refer to [facility] for unit-specific pharmacist

## 2020-10-12 NOTE — ED Notes (Signed)
Nuc Med called about stress test for pt but pt has eaten breakfast. Will reschedule for tomorrow per nuc med.

## 2020-10-12 NOTE — Progress Notes (Addendum)
Progress Note  Patient Name: Jacob Brady Date of Encounter: 10/12/2020  [facility] Cardiologist: Bryan Lemma, MD   Subjective   Called to see patient due to recurrent chest pain this afternoon.  Given SL NTG with resolution of chest pain.  Currently pain free.   Inpatient Medications    Scheduled Meds: . amLODipine  10 mg Oral Daily  . aspirin EC  81 mg Oral Daily  . busPIRone  10 mg Oral BID  . cholecalciferol  2,000 Units Oral Daily  . clopidogrel  75 mg Oral Daily  . divalproex  1,000 mg Oral QHS  . gabapentin  900 mg Oral QHS  . hydrALAZINE  100 mg Oral TID  . insulin aspart  0-15 Units Subcutaneous TID WC  . insulin aspart  0-5 Units Subcutaneous QHS  . isosorbide mononitrate  120 mg Oral Daily  . lisinopril  40 mg Oral Daily  . metFORMIN  1,000 mg Oral BID WC  . metoprolol tartrate  50 mg Oral BID  . mirtazapine  7.5 mg Oral QHS  . rosuvastatin  20 mg Oral Daily  . traZODone  200 mg Oral QHS  . venlafaxine XR  150 mg Oral Daily   Continuous Infusions: . heparin 1,700 Units/hr (10/12/20 1338)   PRN Meds: acetaminophen, clonazePAM, ondansetron (ZOFRAN) IV   Vital Signs    Vitals:   10/12/20 1430 10/12/20 1546 10/12/20 1551 10/12/20 1556  BP: (!) 144/94 122/82 126/84 116/73  Pulse:      Resp: 16     Temp: 97.9 F (36.6 C)     TempSrc: Oral     SpO2: 99%     Weight:      Height:        Intake/Output Summary (Last 24 hours) at 10/12/2020 1612 Last data filed at 10/12/2020 1230 Gross per 24 hour  Intake -  Output 500 ml  Net -500 ml   Last 3 Weights 10/11/2020 01/23/2020 12/25/2019  Weight (lbs) 260 lb 270 lb 238 lb  Weight (kg) 117.935 kg 122.471 kg 107.956 kg  Some encounter information is confidential and restricted. Go to Review Flowsheets activity to see all data.      Telemetry    NSR - Personally Reviewed  ECG    NSR with RBBB with LAFB - Personally Reviewed  Physical Exam   GEN: No acute distress.   Neck: No  JVD Cardiac: RRR, no murmurs, rubs, or gallops.  Respiratory: Clear to auscultation bilaterally. GI: Soft, nontender, non-distended  MS: No edema; No deformity. Neuro:  Nonfocal  Psych: Normal affect   Labs    High Sensitivity Troponin:   Recent Labs  Lab 10/11/20 2359 10/12/20 0140  TROPONINIHS 11 9      Chemistry Recent Labs  Lab 10/11/20 2359 10/12/20 0346  NA 136 134*  K 3.6 3.7  CL 103 101  CO2 22 24  GLUCOSE 144* 125*  BUN 13 12  CREATININE 0.69 0.78  CALCIUM 9.8 9.4  GFRNONAA >60 >60  ANIONGAP 11 9     Hematology Recent Labs  Lab 10/11/20 2359 10/12/20 0346  WBC 11.1* 11.6*  RBC 5.07 4.86  HGB 15.1 14.4  HCT 44.5 43.3  MCV 87.8 89.1  MCH 29.8 29.6  MCHC 33.9 33.3  RDW 15.0 15.1  PLT 223 206    BNPNo results for input(s): BNP, PROBNP in the last 168 hours.   DDimer No results for input(s): DDIMER in the last 168 hours.  Radiology  DG Chest 2 View  Result Date: 10/12/2020 CLINICAL DATA:  60 year old male with chest pain. EXAM: CHEST - 2 VIEW COMPARISON:  Chest radiograph dated 09/24/2019. FINDINGS: Left lung base linear atelectasis/scarring. No focal consolidation, pleural effusion or pneumothorax. Stable cardiac silhouette. Median sternotomy wires. No acute osseous pathology. IMPRESSION: No active cardiopulmonary disease. Electronically Signed   By: Elgie Collard M.D.   On: 10/12/2020 02:02    Cardiac Studies   2D echo 08/28/2019 IMPRESSIONS   1. Left ventricular ejection fraction, by visual estimation, is 60 to  65%. The left ventricle has normal function. Moderately increased left  ventricular posterior wall thickness. There is severely increased left  ventricular hypertrophy of the basal  septum.  2. The left ventricle has no regional wall motion abnormalities.  3. Left ventricular diastolic parameters are consistent with Grade I  diastolic dysfunction (impaired relaxation).  4. Global right ventricle has normal systolic  function.The right  ventricular size is normal. No increase in right ventricular wall  thickness.  5. Left atrial size was normal.  6. Right atrial size was normal.  7. The mitral valve is normal in structure. No evidence of mitral valve  regurgitation. No evidence of mitral stenosis.  8. The tricuspid valve is normal in structure. Tricuspid valve  regurgitation is not demonstrated.  9. The aortic valve is tricuspid. There is moderate thickening and  moderate calcification of the aortic valve. Aortic valve regurgitation is  not visualized. No evidence of aortic valve sclerosis or stenosis.  10. The pulmonic valve was normal in structure. Pulmonic valve  regurgitation is not visualized.  11. The inferior vena cava is normal in size with greater than 50%  respiratory variability, suggesting right atrial pressure of 3 mmHg.   08/27/2019 Conclusion    Prox RCA lesion is 95% stenosed.  Prox Cx lesion is 80% stenosed.  Prox Cx to Mid Cx lesion is 85% stenosed.  Mid Cx lesion is 95% stenosed.  Dist Cx-1 lesion is 90% stenosed.  Dist Cx-2 lesion is 50% stenosed.  Ost LAD to Prox LAD lesion is 75% stenosed.  1st Diag lesion is 90% stenosed.  Mid LAD lesion is 40% stenosed.  There is mild left ventricular systolic dysfunction.  The left ventricular ejection fraction is 45-50% by visual estimate.   Severe multivessel CAD with 75% eccentric near ostial LAD stenosis, diffuse 90% stenosis in the first diagonal branch of the LAD with 40% mid LAD stenosis; segmental high-grade multiple stenoses in a large left circumflex vessel with 80 and 85% proximal stenosis, 95% stenosis at the bifurcation of a large marginal branch followed by 90% stenosis and distal 50% stenosis; large dominant RCA with 95% ulcerated plaque in the proximal to mid vessel.  Mild LV dysfunction with EF estimated 45 to 50% and suggestion of mild distal inferior hypocontractility.  LVEDP 18  mm.  RECOMMENDATION: Angiographic findings were reviewed with Dr. Herbie Baltimore who had seen the patient in cardiology consult.  The patient will be started on IV nitroglycerin drip.  He will to new amlodipine and beta-blocker therapy will be initiated.  Will change statin therapy to atorvastatin 80 mg.  We will add fenofibrate with his marked hypertriglyceridemia and also consider Vascepa post discharge.  He will need stabilization of his diabetic status.  Surgical consultation will be obtained in am.   Patient Profile     60 y.o. male with DM, HLD, HTN, and CAD s/p CABG in Feb 2021 who presents with worsening chest pain.   Assessment &  Plan    Unstable Angina -he has had  Increased frequency of patient's typical anginal symptoms -hsTrop neg x 2 (9>11) -had recurrent chest pain this afternoon that resolved after 2 SL NTG -suspect he may have obstructive disease in one of his grafts. -EKG shows RBBB with LAFB -he is on IV Heparin which will be continued -I think we should proceed with left heart cath tomorrow to assess patency of grafts -cancel nuclear stress test -make NPO after MN tonight -continue ASA 81mg  daily, Plavix 75mg  daily, Imdur 120mg  daily, amlodipine 10mg  daily and Lopressor 50mg  BID -hold metformin -Shared Decision Making/Informed Consent The risks [stroke (1 in 1000), death (1 in 1000), kidney failure [usually temporary] (1 in 500), bleeding (1 in 200), allergic reaction [possibly serious] (1 in 200)], benefits (diagnostic support and management of coronary artery disease) and alternatives of a cardiac catheterization were discussed in detail with Mr. Testa and he is willing to proceed.  2.  ASCAD -s/p CABG in Feb 2021 -continue ASA, Plavix, statin, BB and long acting nitrates  3.  HLD -LDLgoal < 70 -LDL 30 but TAGs 310 -add Vascepa 2gm BID on discharge -continue Crestor 20mg  daily  4.  HTN -BP controlled on exam -continue amlodipine, Hydralazine, Lisinopril and  Lopressor  5.  DM -hold metformin for cath in am -Home 70/30 SS held -continue ISS ac/hs -consider addition of SGLT2 inhibitor at discharge  I have spent a total of 35 minutes with patient reviewing cardiac cath, 2D echo , telemetry, EKGs, labs and examining patient as well as establishing an assessment and plan that was discussed with the patient.  > 50% of time was spent in direct patient care.    For questions or updates, please contact [facility] Please consult [email or web address] for contact info under        Signed, , MD  10/12/2020, 4:12 PM

## 2020-10-12 NOTE — H&P (Signed)
Cardiology Admission History and Physical:   Patient ID: Jacob Brady MRN: [id number]; DOB: June 13, 1961   Admission date: 10/11/2020  [facility] Primary Cardiologist: Bryan Lemmaavid Harding, MD  Primary Electrophysiologist:  None   Chief Complaint:  Chest pain  Patient Profile:   Jacob Brady is a 60 y.o. male with DM, HLD, HTN, and CAD s/p CABG in Feb 2021 who presents with worsening chest pain.   History of Present Illness:   Jacob Brady notably underwent CABG in Feb 2021 after being found to have severe multi-vessel coronary disease. He did well with surgery and states that he remained chest pain free for about 8 months afterward. About 4 months ago however he developed intermittent chest pain. This initially occurred about once or twice per week and would improve with sublingual nitroglycerin. His symptoms were similar to those from before his CABG, however were not as severe. Over the past few weeks or month his chest pain has worsened. He is now having CP 2 to 3 times per day and has been taking SLN to manage this with variable response. [facility] and [facility]. Given how quickly his symptoms are worsening however he decided to come to the ED today for evaluation.   In the ED the patient was noted to have his baseline RBBB and LAFB on ECG without any acute changes. His troponin was negative x 2 and the remainder of his labs and vital signs were unremarkable.   Heart Pathway Score:  HEAR Score: 6  Past Medical History:  Diagnosis Date  . Arthritis   . Current every day smoker   . Essential hypertension    On multiple medications  . 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 Renaldo FiddlerAdkins)  . Hyperlipidemia LDL goal <70    10/08/2019: TC 137, TG 168, HDL 40, LDL 66 on 80 mg atorvastatin.  . Major depressive disorder   .  Multiple vessel coronary artery disease 08/26/2018   Cath:  pRCA 95% ulcerated plaque (large dominant vessel; ~ostial LAD 75% & 90% D1, mLAD 40%; p-m LCx 85%, m LCx 95%, d LCx 90% & 50%.  . Pancreatitis   . Poorly controlled diabetes mellitus (HCC)  with history of DKA    A1c was 15.5 at time of CABG, most recently 6.6 (March 2021)  . PTSD (post-traumatic stress disorder)     Past Surgical History:  Procedure Laterality Date  . CHOLECYSTECTOMY    . CORONARY ARTERY BYPASS GRAFT N/A 08/30/2019   Procedure: CORONARY ARTERY BYPASS GRAFTING (CABG) x 5 using LIMA to LAD; RIMA to rPDA; endoscopic right greater saphenous vein harvest --> SVG- Diag1; and left radial harvest => SeqLRad-OM1 and OM2.;  Surgeon: Linden DolinAtkins, Broadus Z, MD;  Location: [facility] OR;  Service: Open Heart Surgery;  Laterality: N/A;  BILATERAL IMA  . ENDOVEIN HARVEST OF GREATER SAPHENOUS VEIN Right 08/30/2019   Procedure: Mack GuiseEndovein Harvest Of Greater Saphenous Vein;  Surgeon: Linden DolinAtkins, Broadus Z, MD;  [facility];  Service: Open Heart Surgery;  Laterality: Right;  . LEFT HEART CATH AND CORONARY ANGIOGRAPHY N/A 08/27/2019   Procedure: LEFT HEART CATH AND CORONARY ANGIOGRAPHY;  Surgeon: Lennette BihariKelly, Thomas A, MD;  Location: [facility] INVASIVE CV LAB::  pRCA 95% ulcerated plaque (large dominant vessel; ~ostial LAD 75% & 90% D1, mLAD 40%; p-m LCx 85%, m LCx 95%, d LCx 90% &  50%.. EF 45-50%. ==> CVTS Consult  . NM MYOVIEW LTD  08/26/2018   [place]: Progressive (atypical) Angina --> INTERMEDIATE RISK.  EF 45-50%.  Large area of inferior lateral wall infarct at mid had a basal level with small area of moderate apical ischemia.  Inferior lateral HK. --> referred for CATH -> CABG  . RADIAL ARTERY HARVEST Left 08/30/2019   Procedure: RADIAL ARTERY HARVEST;  Surgeon: Linden Dolin, MD;  Location: [facility] OR;  Service: Open Heart Surgery;  Laterality: Left;  . TEE WITHOUT CARDIOVERSION N/A 08/30/2019   Procedure: TRANSESOPHAGEAL ECHOCARDIOGRAM (TEE);  Surgeon: Linden Dolin, MD;  Location: [facility] OR;  Service: Open Heart Surgery;  Laterality: N/A;  . TRANSTHORACIC ECHOCARDIOGRAM  08/28/2019   EF 60 to 65%.  Moderate posterior thickness as well as severe septal LVH-no obvious R WMA.  GR 1 DD.  Normal atria.  Moderate aortic sclerosis but no stenosis  . WRIST FRACTURE SURGERY Right      Medications Prior to Admission: Prior to Admission medications   Medication Sig Start Date End Date Taking? Authorizing Provider  amLODipine (NORVASC) 10 MG tablet Take 10 mg by mouth daily.   Yes [provider]  aspirin EC 81 MG tablet Take 81 mg by mouth daily.   Yes [provider]  busPIRone (BUSPAR) 10 MG tablet Take 10 mg by mouth 2 (two) times daily.    Yes [provider]  Cholecalciferol 50 MCG (2000 UT) TABS Take 2,000 Units by mouth daily. 03/06/20  Yes [provider]  clonazePAM (KLONOPIN) 0.5 MG tablet Take 0.5 mg by mouth 2 (two) times daily as needed for anxiety. 09/17/20  Yes [provider]  clopidogrel (PLAVIX) 75 MG tablet Take 1 tablet (75 mg total) by mouth daily. Please [facility] any [facility] Jess Barters 09/27/19  Yes Vickey Sages, Merri Brunette, MD  divalproex (DEPAKOTE ER) 500 MG 24 hr tablet Take 1,000 mg by mouth at bedtime. 08/21/20  Yes [provider]  gabapentin (NEURONTIN) 300 MG capsule Take 900 mg by mouth at bedtime.    Yes [provider]  hydrALAZINE (APRESOLINE) 100 MG tablet Take 100 mg by mouth 3 (three) times daily. 08/30/20  Yes [provider]  insulin NPH-regular Human (70-30) 100 UNIT/ML injection Inject 10-30 Units into the skin See admin instructions. Sliding scale before meals   Yes [provider]  isosorbide mononitrate (IMDUR) 120 MG 24 hr tablet Take 120 mg by mouth daily. 09/21/20  Yes [provider]  lisinopril (ZESTRIL) 40 MG tablet Take 40 mg by mouth daily.   Yes [provider]  metFORMIN (GLUCOPHAGE) 1000 MG tablet Take  1,000 mg by mouth 2 (two) times daily. 09/12/20  Yes [provider]  metoprolol tartrate (LOPRESSOR) 50 MG tablet Take 1 tablet (50 mg total) by mouth 2 (two) times daily. 12/25/19 03/24/20 Yes Marykay Lex, MD  mirtazapine (REMERON) 15 MG tablet Take 7.5 mg by mouth daily. 08/21/20  Yes [provider]  nitroGLYCERIN (NITROSTAT) 0.4 MG SL tablet Place 0.4 mg under the tongue as needed. 07/22/20 07/23/21 Yes [provider]  traZODone (DESYREL) 100 MG tablet Take 200 mg by mouth at bedtime.   Yes [provider]  venlafaxine XR (EFFEXOR-XR) 150 MG 24 hr capsule Take 150 mg by mouth daily. 08/21/20  Yes [provider]     Allergies:    Allergies  Allergen Reactions  . Hctz [Hydrochlorothiazide] Other (See Comments)  Unknown rxn, "they just told me not to take it anymore"  . Lurasidone     Other reaction(s): Dystonia  . Seroquel [Quetiapine Fumarate] Other (See Comments)    Elevates liver enzymes     Social History:   Social History   Socioeconomic History  . Marital status: Divorced    Spouse name: Not on file  . Number of children: Not on file  . Years of education: Not on file  . Highest education level: Not on file  Occupational History  . Occupation: disability  Tobacco Use  . Smoking status: Former Smoker    Packs/day: 1.50    Years: 35.00    Pack years: 52.50    Quit date: 10/25/2019    Years since quitting: 0.9  . Smokeless tobacco: Never Used  Vaping Use  . Vaping Use: Never used  Substance and Sexual Activity  . Alcohol use: No  . Drug use: No  . Sexual activity: Not Currently  Other Topics Concern  . Not on file  Social History Narrative  . Not on file   Social Determinants of Health   Financial Resource Strain: Not on file  Food Insecurity: Not on file  Transportation Needs: Not on file  Physical Activity: Not on file  Stress: Not on file  Social Connections: Not on file  Intimate Partner Violence: Not on  file    Family History:   The patient's family history includes Aneurysm in his mother; Brain cancer (age of onset: 25) in his father; Healthy in his brother, sister, sister, and sister.    ROS:  Please see the history of present illness.  All other ROS reviewed and negative.     Physical Exam/Data:   Vitals:   10/12/20 0145 10/12/20 0200 10/12/20 0230 10/12/20 0300  BP: 131/84 (!) 151/85 (!) 158/82 (!) 157/93  Pulse: 64 62 67 63  Resp: 13 17 19 17   Temp:      TempSrc:      SpO2: 96% 97% 99% 97%  Weight:      Height:       No intake or output data in the 24 hours ending 10/12/20 0313 Last 3 Weights 10/11/2020 01/23/2020 12/25/2019  Weight (lbs) 260 lb 270 lb 238 lb  Weight (kg) 117.935 kg 122.471 kg 107.956 kg  Some encounter information is confidential and restricted. Go to Review Flowsheets activity to see all data.     Body mass index is 37.31 kg/m.  General:  Well nourished, well developed, in no acute distress HEENT: normal Neck: no JVD Vascular: No carotid bruits; FA pulses 2+ bilaterally without bruits  Cardiac:  normal S1, S2; RRR; no murmur  Lungs:  clear to auscultation bilaterally, no wheezing, rhonchi or rales  Abd: soft, nontender, no hepatomegaly  Ext: no edema Musculoskeletal:  No deformities, BUE and BLE strength normal and equal Skin: warm and dry   EKG:  The ECG that was done on arrival to the ED was personally reviewed and demonstrates NSR, RBBB, LAFB  Relevant CV Studies: Echo 08/2019 LVEF 60%, no significant valvular disease  LHC 08/2019  Prox RCA lesion is 95% stenosed.  Prox Cx lesion is 80% stenosed.  Prox Cx to Mid Cx lesion is 85% stenosed.  Mid Cx lesion is 95% stenosed.  Dist Cx-1 lesion is 90% stenosed.  Dist Cx-2 lesion is 50% stenosed.  Ost LAD to Prox LAD lesion is 75% stenosed.  1st Diag lesion is 90% stenosed.  Mid LAD lesion is 40%  stenosed.  There is mild left ventricular systolic dysfunction.  The left ventricular  ejection fraction is 45-50% by visual estimate.    Laboratory Data:  High Sensitivity Troponin:   Recent Labs  Lab 10/11/20 2359  TROPONINIHS 11      Chemistry Recent Labs  Lab 10/11/20 2359  NA 136  K 3.6  CL 103  CO2 22  GLUCOSE 144*  BUN 13  CREATININE 0.69  CALCIUM 9.8  GFRNONAA >60  ANIONGAP 11    No results for input(s): PROT, ALBUMIN, AST, ALT, ALKPHOS, BILITOT in the last 168 hours. Hematology Recent Labs  Lab 10/11/20 2359  WBC 11.1*  RBC 5.07  HGB 15.1  HCT 44.5  MCV 87.8  MCH 29.8  MCHC 33.9  RDW 15.0  PLT 223   BNPNo results for input(s): BNP, PROBNP in the last 168 hours.  DDimer No results for input(s): DDIMER in the last 168 hours.   Radiology/Studies:  DG Chest 2 View  Result Date: 10/12/2020 CLINICAL DATA:  60 year old male with chest pain. EXAM: CHEST - 2 VIEW COMPARISON:  Chest radiograph dated 09/24/2019. FINDINGS: Left lung base linear atelectasis/scarring. No focal consolidation, pleural effusion or pneumothorax. Stable cardiac silhouette. Median sternotomy wires. No acute osseous pathology. IMPRESSION: No active cardiopulmonary disease. Electronically Signed   By: Elgie Collard M.D.   On: 10/12/2020 02:02      HEAR Score (for undifferentiated chest pain):  HEAR Score: 6   Assessment and Plan:  Lenton Gendreau is a 60 y.o. male with DM, HLD, HTN, and CAD s/p CABG in Feb 2021 who presents with worsening chest pain suggestive of crescendo angina.   The fact that the frequency and severity of the patient's chest pain has been worsening lately is very concerning and possibly suggestive of obstructive disease in one of his bypass grafts. He appears to be on reasonable medical therapy at this time with beta blockade and high dose long acting nitrates. The next step to manage his likely ischemic chest pain would be invasive angiography. A non-invasive ischemia assessment may be helpful prior to cath to help guide intervention. Given that  there is a significant likelihood that his symptoms are from vein graft obstruction, will start him on heparin now to attempt to preserve patency.   #) Chest pain - SPECT ordered to help guide intervention as noted above - cont ASA 81mg  daily - cont Imdur, metoprolol - SLN PRN - morphine PRN - heparin drip for ACS per pharmacy protocol - start rosuvastatin 20mg  daily with daily LFT monitoring (appears to have had issues with atorva 80 in the past) - cont plavix  #) HTN:  - cont amlodipine, hydralazine, lisinopril  #) DM - cont metformin  - hold home 70/30 sliding scale for now - ISS AC & HS - would consider starting SGLT2 inhibitor given severity of patient's coronary disease  Severity of Illness: The appropriate patient status for this patient is INPATIENT. Inpatient status is judged to be reasonable and necessary in order to provide the required intensity of service to ensure the patient's safety. The patient's presenting symptoms, physical exam findings, and initial radiographic and laboratory data in the context of their chronic comorbidities is felt to place them at high risk for further clinical deterioration. Furthermore, it is not anticipated that the patient will be medically stable for discharge from the hospital within 2 midnights of admission. The following factors support the patient status of inpatient.   " The patient's presenting symptoms include  chest pain. " The worrisome physical exam findings include n/a. " The initial radiographic and laboratory data are worrisome because of n/a. " The chronic co-morbidities include HTN, HLD.   * I certify that at the point of admission it is my clinical judgment that the patient will require inpatient hospital care spanning beyond 2 midnights from the point of admission due to high intensity of service, high risk for further deterioration and high frequency of surveillance required.*    For questions or updates, please contact  [facility] Please consult [email or web address] for contact info under    Signed, Rosario Jacks, MD  10/12/2020 3:13 AM

## 2020-10-12 NOTE — ED Notes (Signed)
Attempted to call report

## 2020-10-12 NOTE — Progress Notes (Signed)
Pt c/o chest pain 3/10.  Administered nitro SL x2 with good effect. Administered O2 4L via [place]. EKG done. Cardiology PA made aware.  Hinton Dyer, RN

## 2020-10-12 NOTE — Progress Notes (Signed)
ANTICOAGULATION CONSULT NOTE - Initial Consult  Pharmacy Consult for Heparin Indication: chest pain/ACS  Allergies  Allergen Reactions  . Hctz [Hydrochlorothiazide] Other (See Comments)    Unknown rxn, "they just told me not to take it anymore"  . Lurasidone     Other reaction(s): Dystonia  . Seroquel [Quetiapine Fumarate] Other (See Comments)    Elevates liver enzymes     Patient Measurements: Height: 5\' 10"  (177.8 cm) Weight: 117.9 kg (260 lb) IBW/kg (Calculated) : 73 Heparin Dosing Weight: 99 kg  Vital Signs: Temp: 99.2 F (37.3 C) (03/19 2337) Temp Source: Oral (03/19 2337) BP: 157/93 (03/20 0300) Pulse Rate: 63 (03/20 0300)  Labs: Recent Labs    10/11/20 2359  HGB 15.1  HCT 44.5  PLT 223  CREATININE 0.69  TROPONINIHS 11    Estimated Creatinine Clearance: 131.1 mL/min (by C-G formula based on SCr of 0.69 mg/dL).   Medical History: Past Medical History:  Diagnosis Date  . Arthritis   . Current every day smoker   . Essential hypertension    On multiple medications  . 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 10/28/2019)  . Hyperlipidemia LDL goal <70    10/08/2019: TC 137, TG 168, HDL 40, LDL 66 on 80 mg atorvastatin.  . Major depressive disorder   . Multiple vessel coronary artery disease 08/26/2018   Cath:  pRCA 95% ulcerated plaque (large dominant vessel; ~ostial LAD 75% & 90% D1, mLAD 40%; p-m LCx 85%, m LCx 95%, d LCx 90% & 50%.  . Pancreatitis   . Poorly controlled diabetes mellitus (HCC)  with history of DKA    A1c was 15.5 at time of CABG, most recently 6.6 (March 2021)  . PTSD (post-traumatic stress disorder)      Medications:  See electronic med rec  Assessment: 60 y.o. M presents with CP. To begin heparin for r/o ACS. CBC ok on admission. No AC PTA.    Goal of Therapy:  Heparin level 0.3-0.7 units/ml Monitor platelets by anticoagulation protocol: Yes   Plan:  Heparin IV bolus 4000  units Heparin gtt at 1400 units/hr Will f/u heparin level in 6 hours Daily heparin level and CBC  60, PharmD, BCPS Please see amion for complete clinical pharmacist phone list 10/12/2020,3:25 AM

## 2020-10-12 NOTE — ED Provider Notes (Signed)
[facility] EMERGENCY DEPARTMENT Provider Note   CSN: [id number] Arrival date & time: 10/11/20  2328     History Chief Complaint  Patient presents with  . Chest Pain    Jacob Brady is a 60 y.o. male.  HPI   60 year old male with history of coronary artery disease status post CABG in 2021.  Patient has been asymptomatic for about 7 months after his surgery but in the last 5 months is a progressively worsening anginal symptoms.  He was seen at the [facility] and subsequently at [facility] and was told that he needed a stress test.  I will schedule for this, week.  Patient states over the last few days the pain has become more frequent and today has had multiple times.  States it is in his chest radiates to his arms associate shortness of breath nausea and dizziness.  No diaphoresis.  Patient states he feels the same as when he had unstable angina and had multiple vessels that were blocked last year.  Patient presents here for further evaluation.  No recent fever cough.  No other GI symptoms.  HPI: A 60 year old patient with a history of hypertension and hypercholesterolemia presents for evaluation of chest pain. Initial onset of pain was less than one hour ago. The patient's chest pain is described as heaviness/pressure/tightness, is worse with exertion and is relieved by nitroglycerin. The patient's chest pain is middle- or left-sided, is not well-localized, is not sharp and does radiate to the arms/jaw/neck. The patient does not complain of nausea and denies diaphoresis. The patient has no history of stroke, has no history of peripheral artery disease, has not smoked in the past 90 days, denies any history of treated diabetes, has no relevant family history of coronary artery disease (first degree relative at less than age 72) and does not have an elevated BMI (>=30).   Past Medical History:  Diagnosis Date  . Arthritis   . Current every day smoker   .  Essential hypertension    On multiple medications  . 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 Renaldo Fiddler)  . Hyperlipidemia LDL goal <70    10/08/2019: TC 137, TG 168, HDL 40, LDL 66 on 80 mg atorvastatin.  . Major depressive disorder   . Multiple vessel coronary artery disease 08/26/2018   Cath:  pRCA 95% ulcerated plaque (large dominant vessel; ~ostial LAD 75% & 90% D1, mLAD 40%; p-m LCx 85%, m LCx 95%, d LCx 90% & 50%.  . Pancreatitis   . Poorly controlled diabetes mellitus (HCC)  with history of DKA    A1c was 15.5 at time of CABG, most recently 6.6 (March 2021)  . PTSD (post-traumatic stress disorder)     Patient Active Problem List   Diagnosis Date Noted  . Hyperlipidemia LDL goal <70   . S/P CABG x 5 08/30/2019  . Suicide ideation 08/27/2019  . Unstable angina (HCC)   . Back pain 08/06/2019  . DKA, type 2 (HCC) 08/05/2019  . History of pancreatitis 08/05/2019  . Severe recurrent major depression without psychotic features (HCC) 01/03/2019  . MDD (major depressive disorder), recurrent episode, severe (HCC) 01/03/2019  . Coronary artery disease involving native coronary artery of native heart with angina pectoris (HCC) 08/26/2018  . Essential hypertension 08/03/2015  . Diabetes mellitus type 2 in obese (HCC) 08/03/2015  . PTSD (post-traumatic stress disorder) 08/03/2015  . Abdominal aortic aneurysm (AAA) (HCC) 08/03/2015  Past Surgical History:  Procedure Laterality Date  . CHOLECYSTECTOMY    . CORONARY ARTERY BYPASS GRAFT N/A 08/30/2019   Procedure: CORONARY ARTERY BYPASS GRAFTING (CABG) x 5 using LIMA to LAD; RIMA to rPDA; endoscopic right greater saphenous vein harvest --> SVG- Diag1; and left radial harvest => SeqLRad-OM1 and OM2.;  Surgeon: Linden Dolin, MD;  Location: [facility] OR;  Service: Open Heart Surgery;  Laterality: N/A;  BILATERAL IMA  . ENDOVEIN HARVEST OF GREATER SAPHENOUS VEIN Right 08/30/2019   Procedure: Mack Guise Of Greater Saphenous Vein;  Surgeon: Linden Dolin, MD;  Location: [facility] OR;  Service: Open Heart Surgery;  Laterality: Right;  . LEFT HEART CATH AND CORONARY ANGIOGRAPHY N/A 08/27/2019   Procedure: LEFT HEART CATH AND CORONARY ANGIOGRAPHY;  Surgeon: Lennette Bihari, MD;  Location: [facility] INVASIVE CV LAB::  pRCA 95% ulcerated plaque (large dominant vessel; ~ostial LAD 75% & 90% D1, mLAD 40%; p-m LCx 85%, m LCx 95%, d LCx 90% & 50%.. EF 45-50%. ==> CVTS Consult  . NM MYOVIEW LTD  08/26/2018   [place]: Progressive (atypical) Angina --> INTERMEDIATE RISK.  EF 45-50%.  Large area of inferior lateral wall infarct at mid had a basal level with small area of moderate apical ischemia.  Inferior lateral HK. --> referred for CATH -> CABG  . RADIAL ARTERY HARVEST Left 08/30/2019   Procedure: RADIAL ARTERY HARVEST;  Surgeon: Linden Dolin, MD;  Location: [facility] OR;  Service: Open Heart Surgery;  Laterality: Left;  . TEE WITHOUT CARDIOVERSION N/A 08/30/2019   Procedure: TRANSESOPHAGEAL ECHOCARDIOGRAM (TEE);  Surgeon: Linden Dolin, MD;  Location: [facility] OR;  Service: Open Heart Surgery;  Laterality: N/A;  . TRANSTHORACIC ECHOCARDIOGRAM  08/28/2019   EF 60 to 65%.  Moderate posterior thickness as well as severe septal LVH-no obvious R WMA.  GR 1 DD.  Normal atria.  Moderate aortic sclerosis but no stenosis  . WRIST FRACTURE SURGERY Right        Family History  Problem Relation Age of Onset  . Aneurysm Mother        Brain  . Brain cancer Father 61  . Healthy Sister   . Healthy Brother   . Healthy Sister   . Healthy Sister     Social History   Tobacco Use  . Smoking status: Former Smoker    Packs/day: 1.50    Years: 35.00    Pack years: 52.50    Quit date: 10/25/2019    Years since quitting: 0.9  . Smokeless tobacco: Never Used  Vaping Use  . Vaping Use: Never used  Substance Use Topics  . Alcohol use: No  . Drug use: No    Home Medications Prior to Admission medications   Medication  Sig Start Date End Date Taking? Authorizing Provider  amLODipine (NORVASC) 10 MG tablet Take 10 mg by mouth daily.   Yes [provider]  aspirin EC 81 MG tablet Take 81 mg by mouth daily.   Yes [provider]  busPIRone (BUSPAR) 10 MG tablet Take 10 mg by mouth 2 (two) times daily.    Yes [provider]  Cholecalciferol 50 MCG (2000 UT) TABS Take 2,000 Units by mouth daily. 03/06/20  Yes [provider]  clonazePAM (KLONOPIN) 0.5 MG tablet Take 0.5 mg by mouth 2 (two) times daily as needed for anxiety. 09/17/20  Yes [provider]  clopidogrel (PLAVIX) 75 MG tablet Take 1 tablet (75 mg total) by mouth daily. Please send  any refill request [facility]Dr Jess Barters 09/27/19  Yes Vickey Sages, Merri Brunette, MD  divalproex (DEPAKOTE ER) 500 MG 24 hr tablet Take 1,000 mg by mouth at bedtime. 08/21/20  Yes [provider]  gabapentin (NEURONTIN) 300 MG capsule Take 900 mg by mouth at bedtime.    Yes [provider]  hydrALAZINE (APRESOLINE) 100 MG tablet Take 100 mg by mouth 3 (three) times daily. 08/30/20  Yes [provider]  insulin NPH-regular Human (70-30) 100 UNIT/ML injection Inject 10-30 Units into the skin See admin instructions. Sliding scale before meals   Yes [provider]  isosorbide mononitrate (IMDUR) 120 MG 24 hr tablet Take 120 mg by mouth daily. 09/21/20  Yes [provider]  lisinopril (ZESTRIL) 40 MG tablet Take 40 mg by mouth daily.   Yes [provider]  metFORMIN (GLUCOPHAGE) 1000 MG tablet Take 1,000 mg by mouth 2 (two) times daily. 09/12/20  Yes [provider]  metoprolol tartrate (LOPRESSOR) 50 MG tablet Take 1 tablet (50 mg total) by mouth 2 (two) times daily. 12/25/19 03/24/20 Yes Marykay Lex, MD  mirtazapine (REMERON) 15 MG tablet Take 7.5 mg by mouth daily. 08/21/20  Yes [provider]  nitroGLYCERIN (NITROSTAT) 0.4 MG SL tablet Place 0.4 mg under the tongue as  needed. 07/22/20 07/23/21 Yes [provider]  traZODone (DESYREL) 100 MG tablet Take 200 mg by mouth at bedtime.   Yes [provider]  venlafaxine XR (EFFEXOR-XR) 150 MG 24 hr capsule Take 150 mg by mouth daily. 08/21/20  Yes [provider]    Allergies    Hctz [hydrochlorothiazide], Lurasidone, and Seroquel [quetiapine fumarate]  Review of Systems   Review of Systems  All other systems reviewed and are negative.   Physical Exam Updated Vital Signs BP (!) 157/93   Pulse 63   Temp 99.2 F (37.3 C) (Oral)   Resp 17   Ht 5\' 10"  (1.778 m)   Wt 117.9 kg   SpO2 97%   BMI 37.31 kg/m   Physical Exam Vitals and nursing note reviewed.  Constitutional:      Appearance: He is well-developed.  HENT:     Head: Normocephalic and atraumatic.  Cardiovascular:     Rate and Rhythm: Normal rate.  Pulmonary:     Effort: Pulmonary effort is normal. No respiratory distress.  Abdominal:     General: There is no distension.     Palpations: Abdomen is soft.  Musculoskeletal:        General: Normal range of motion.     Cervical back: Normal range of motion.  Skin:    General: Skin is warm and dry.  Neurological:     General: No focal deficit present.     Mental Status: He is alert.     ED Results / Procedures / Treatments   Labs (all labs ordered are listed, but only abnormal results are displayed) Labs Reviewed  BASIC METABOLIC PANEL - Abnormal; Notable for the following components:      Result Value   Glucose, Bld 144 (*)    All other components within normal limits  CBC - Abnormal; Notable for the following components:   WBC 11.1 (*)    All other components within normal limits  TROPONIN I (HIGH SENSITIVITY)  TROPONIN I (HIGH SENSITIVITY)    EKG EKG Interpretation  Date/Time:  Saturday October 11 2020 23:48:41 EDT Ventricular Rate:  72 PR Interval:  172 QRS Duration: 164 QT Interval:  452  QTC Calculation: 494 R Axis:   -66 Text  Interpretation: Normal sinus rhythm Right bundle branch block Left anterior fascicular block Minimal voltage criteria for LVH, may be normal variant ( R in aVL ) Abnormal ECG No significant change since last tracing Confirmed by Marily MemosMesner, Jason [id number]) on 10/12/2020 12:00:09 AM   Radiology DG Chest 2 View  Result Date: 10/12/2020 CLINICAL DATA:  60 year old male with chest pain. EXAM: CHEST - 2 VIEW COMPARISON:  Chest radiograph dated 09/24/2019. FINDINGS: Left lung base linear atelectasis/scarring. No focal consolidation, pleural effusion or pneumothorax. Stable cardiac silhouette. Median sternotomy wires. No acute osseous pathology. IMPRESSION: No active cardiopulmonary disease. Electronically Signed   By: Elgie CollardArash  Radparvar M.D.   On: 10/12/2020 02:02    Procedures Procedures   Medications Ordered in ED Medications  nitroGLYCERIN (NITROSTAT) SL tablet 0.4 mg (0.4 mg Sublingual Given 10/12/20 0027)    ED Course  I have reviewed the triage vital signs and the nursing notes.  Pertinent labs & imaging results that were available during my care of the patient were reviewed by me and considered in my medical decision making (see chart for details).    MDM Rules/Calculators/A&P HEAR Score: 6                        Concern for unstable angina.  Will get EKG, chest x-ray and troponin.  I will discuss with cardiology and asked them for consultation for further management. ecg ok. Pain improved with NTG again. D/w cardiology, will admit for further management.   Final Clinical Impression(s) / ED Diagnoses Final diagnoses:  Nonspecific chest pain    Rx / DC Orders ED Discharge Orders    None       Mesner, Barbara CowerJason, MD 10/12/20 [phone]

## 2020-10-13 ENCOUNTER — Inpatient Hospital Stay (HOSPITAL_COMMUNITY)
Admission: EM | Disposition: A | Discharge status: Home / Self Care | Payer: Self-pay | Source: Home / Self Care | Attending: Internal Medicine

## 2020-10-13 DIAGNOSIS — I25110 Atherosclerotic heart disease of native coronary artery with unstable angina pectoris: Secondary | ICD-10-CM

## 2020-10-13 DIAGNOSIS — Z951 Presence of aortocoronary bypass graft: Secondary | ICD-10-CM

## 2020-10-13 DIAGNOSIS — E785 Hyperlipidemia, unspecified: Secondary | ICD-10-CM

## 2020-10-13 HISTORY — PX: LEFT HEART CATH AND CORS/GRAFTS ANGIOGRAPHY: CATH118250

## 2020-10-13 LAB — CBC
HCT: 40.6 % (ref 39.0–52.0)
Hemoglobin: 13.6 g/dL (ref 13.0–17.0)
MCH: 29.6 pg (ref 26.0–34.0)
MCHC: 33.5 g/dL (ref 30.0–36.0)
MCV: 88.3 fL (ref 80.0–100.0)
Platelets: 192 10*3/uL (ref 150–400)
RBC: 4.6 MIL/uL (ref 4.22–5.81)
RDW: 15.2 % (ref 11.5–15.5)
WBC: 10.1 10*3/uL (ref 4.0–10.5)
nRBC: 0 % (ref 0.0–0.2)

## 2020-10-13 LAB — GLUCOSE, CAPILLARY
Glucose-Capillary: 142 mg/dL — ABNORMAL HIGH (ref 70–99)
Glucose-Capillary: 132 mg/dL — ABNORMAL HIGH (ref 70–99)
Glucose-Capillary: 116 mg/dL — ABNORMAL HIGH (ref 70–99)
Glucose-Capillary: 123 mg/dL — ABNORMAL HIGH (ref 70–99)
Glucose-Capillary: 139 mg/dL — ABNORMAL HIGH (ref 70–99)

## 2020-10-13 LAB — HEPARIN LEVEL (UNFRACTIONATED): Heparin Unfractionated: 0.33 IU/mL (ref 0.30–0.70)

## 2020-10-13 SURGERY — LEFT HEART CATH AND CORS/GRAFTS ANGIOGRAPHY
Anesthesia: LOCAL

## 2020-10-13 MED ORDER — IOHEXOL 350 MG/ML SOLN
INTRAVENOUS | Status: DC | PRN
  Administered 2020-10-13: 165 mL

## 2020-10-13 MED ORDER — IOHEXOL 350 MG/ML SOLN
INTRAVENOUS | Status: AC
  Filled 2020-10-13: qty 1

## 2020-10-13 MED ORDER — SODIUM CHLORIDE 0.9% FLUSH: 3.0000 mL | Freq: Two times a day (BID) | INTRAVENOUS | Status: DC

## 2020-10-13 MED ORDER — SODIUM CHLORIDE 0.9 % IV SOLN: 250.0000 mL | INTRAVENOUS | Status: DC | PRN

## 2020-10-13 MED ORDER — VERAPAMIL HCL 2.5 MG/ML IV SOLN
INTRAVENOUS | Status: AC
  Filled 2020-10-13: qty 2

## 2020-10-13 MED ORDER — HYDRALAZINE HCL 20 MG/ML IJ SOLN: 10.0000 mg | INTRAMUSCULAR | Status: AC | PRN

## 2020-10-13 MED ORDER — LABETALOL HCL 5 MG/ML IV SOLN
INTRAVENOUS | Status: AC
  Filled 2020-10-13: qty 4

## 2020-10-13 MED ORDER — LABETALOL HCL 5 MG/ML IV SOLN
10.0000 mg | INTRAVENOUS | Status: AC | PRN
Start: 2020-10-13 — End: 2020-10-13
  Administered 2020-10-13: 10 mg via INTRAVENOUS

## 2020-10-13 MED ORDER — LIDOCAINE HCL (PF) 1 % IJ SOLN
INTRAMUSCULAR | Status: AC
  Filled 2020-10-13: qty 30

## 2020-10-13 MED ORDER — SODIUM CHLORIDE 0.9% FLUSH
3.0000 mL | INTRAVENOUS | Status: DC | PRN
Start: 2020-10-13 — End: 2020-10-14

## 2020-10-13 MED ORDER — LIDOCAINE HCL (PF) 1 % IJ SOLN
INTRAMUSCULAR | Status: DC | PRN
  Administered 2020-10-13: 15 mL

## 2020-10-13 MED ORDER — HEPARIN (PORCINE) IN NACL 1000-0.9 UT/500ML-% IV SOLN
INTRAVENOUS | Status: AC
  Filled 2020-10-13: qty 1000

## 2020-10-13 MED ORDER — SODIUM CHLORIDE 0.9% FLUSH: 3.0000 mL | INTRAVENOUS | Status: DC | PRN

## 2020-10-13 MED ORDER — FENTANYL CITRATE (PF) 100 MCG/2ML IJ SOLN
INTRAMUSCULAR | Status: AC
  Filled 2020-10-13: qty 2

## 2020-10-13 MED ORDER — SODIUM CHLORIDE 0.9 % IV SOLN: INTRAVENOUS | Status: AC

## 2020-10-13 MED ORDER — HEPARIN SODIUM (PORCINE) 5000 UNIT/ML IJ SOLN: 5000.0000 [IU] | Freq: Three times a day (TID) | INTRAMUSCULAR | Status: DC

## 2020-10-13 MED ORDER — FENTANYL CITRATE (PF) 100 MCG/2ML IJ SOLN
INTRAMUSCULAR | Status: DC | PRN
  Administered 2020-10-13: 25 ug via INTRAVENOUS

## 2020-10-13 MED ORDER — SODIUM CHLORIDE 0.9% FLUSH
3.0000 mL | Freq: Two times a day (BID) | INTRAVENOUS | Status: DC
  Administered 2020-10-13: 3 mL via INTRAVENOUS

## 2020-10-13 MED ORDER — MIDAZOLAM HCL 2 MG/2ML IJ SOLN
INTRAMUSCULAR | Status: AC
  Filled 2020-10-13: qty 2

## 2020-10-13 MED ORDER — LABETALOL HCL 5 MG/ML IV SOLN
INTRAVENOUS | Status: DC | PRN
  Administered 2020-10-13: 10 mg via INTRAVENOUS

## 2020-10-13 MED ORDER — MIDAZOLAM HCL 2 MG/2ML IJ SOLN
INTRAMUSCULAR | Status: DC | PRN
  Administered 2020-10-13: 2 mg via INTRAVENOUS

## 2020-10-13 SURGICAL SUPPLY — 12 items
CATH INFINITI 5 FR IM (CATHETERS) ×2 IMPLANT
CATH INFINITI 5FR MULTPACK ANG (CATHETERS) ×2 IMPLANT
GUIDEWIRE ANGLED .035X150CM (WIRE) ×2 IMPLANT
KIT HEART LEFT (KITS) ×2 IMPLANT
KIT MICROPUNCTURE NIT STIFF (SHEATH) ×2 IMPLANT
PACK CARDIAC CATHETERIZATION (CUSTOM PROCEDURE TRAY) ×2 IMPLANT
SHEATH PINNACLE 5F 10CM (SHEATH) ×2 IMPLANT
SHEATH PROBE COVER 6X72 (BAG) ×2 IMPLANT
TRANSDUCER W/STOPCOCK (MISCELLANEOUS) ×2 IMPLANT
TUBING CIL FLEX 10 FLL-RA (TUBING) ×2 IMPLANT
WIRE EMERALD 3MM-J .035X150CM (WIRE) ×4 IMPLANT
WIRE HI TORQ VERSACORE-J 145CM (WIRE) ×2 IMPLANT

## 2020-10-13 NOTE — Progress Notes (Signed)
Progress Note  Patient Name: Jacob Brady Date of Encounter: 10/13/2020  [facility] Cardiologist: Bryan Lemma, MD   Subjective   Feeling a bit nervous this morning.  Mildly short of breath.  No crackles on exam.  Able to sit up without difficulty.  Inpatient Medications    Scheduled Meds: . amLODipine  10 mg Oral Daily  . aspirin EC  81 mg Oral Daily  . busPIRone  10 mg Oral BID  . cholecalciferol  2,000 Units Oral Daily  . clopidogrel  75 mg Oral Daily  . divalproex  1,000 mg Oral QHS  . gabapentin  900 mg Oral QHS  . hydrALAZINE  100 mg Oral TID  . insulin aspart  0-15 Units Subcutaneous TID WC  . insulin aspart  0-5 Units Subcutaneous QHS  . isosorbide mononitrate  120 mg Oral Daily  . lisinopril  40 mg Oral Daily  . metoprolol tartrate  50 mg Oral BID  . mirtazapine  7.5 mg Oral QHS  . rosuvastatin  20 mg Oral Daily  . sodium chloride flush  3 mL Intravenous Q12H  . traZODone  200 mg Oral QHS  . venlafaxine XR  150 mg Oral Daily   Continuous Infusions: . sodium chloride    . sodium chloride 1 mL/kg/hr (10/13/20 0500)  . heparin 1,800 Units/hr (10/13/20 0600)   PRN Meds: sodium chloride, acetaminophen, clonazePAM, ondansetron (ZOFRAN) IV, sodium chloride flush   Vital Signs    Vitals:   10/12/20 2157 10/13/20 0039 10/13/20 0403 10/13/20 0855  BP: (!) 162/91 (!) 163/89 (!) 155/94 (!) 163/97  Pulse: 73 67 71 64  Resp:  16 18   Temp:  98.4 F (36.9 C) 97.9 F (36.6 C)   TempSrc:  Oral Oral   SpO2:  98% 96%   Weight:   118.4 kg   Height:        Intake/Output Summary (Last 24 hours) at 10/13/2020 0952 Last data filed at 10/13/2020 0900 Gross per 24 hour  Intake 1159.96 ml  Output 2025 ml  Net -865.04 ml   Last 3 Weights 10/13/2020 10/11/2020 01/23/2020  Weight (lbs) 261 lb 260 lb 270 lb  Weight (kg) 118.389 kg 117.935 kg 122.471 kg  Some encounter information is confidential and restricted. Go to Review Flowsheets activity to see all data.       Telemetry    Sinus rhythm, less than 2-second pause noted at night.- Personally Reviewed  ECG    Sinus rhythm with right bundle branch block left anterior fascicular block- Personally Reviewed  Physical Exam   GEN: No acute distress.  Overweight Neck: No JVD Cardiac: RRR, no murmurs, rubs, or gallops.  Respiratory: Clear to auscultation bilaterally. GI: Soft, nontender, non-distended  MS: No edema; No deformity. Neuro:  Nonfocal  Psych: Normal affect   Labs    High Sensitivity Troponin:   Recent Labs  Lab 10/11/20 2359 10/12/20 0140  TROPONINIHS 11 9      Chemistry Recent Labs  Lab 10/11/20 2359 10/12/20 0346  NA 136 134*  K 3.6 3.7  CL 103 101  CO2 22 24  GLUCOSE 144* 125*  BUN 13 12  CREATININE 0.69 0.78  CALCIUM 9.8 9.4  GFRNONAA >60 >60  ANIONGAP 11 9     Hematology Recent Labs  Lab 10/11/20 2359 10/12/20 0346 10/13/20 0515  WBC 11.1* 11.6* 10.1  RBC 5.07 4.86 4.60  HGB 15.1 14.4 13.6  HCT 44.5 43.3 40.6  MCV 87.8 89.1 88.3  MCH 29.8 29.6 29.6  MCHC 33.9 33.3 33.5  RDW 15.0 15.1 15.2  PLT 223 206 192    BNPNo results for input(s): BNP, PROBNP in the last 168 hours.   DDimer No results for input(s): DDIMER in the last 168 hours.   Radiology    DG Chest 2 View  Result Date: 10/12/2020 CLINICAL DATA:  60 year old male with chest pain. EXAM: CHEST - 2 VIEW COMPARISON:  Chest radiograph dated 09/24/2019. FINDINGS: Left lung base linear atelectasis/scarring. No focal consolidation, pleural effusion or pneumothorax. Stable cardiac silhouette. Median sternotomy wires. No acute osseous pathology. IMPRESSION: No active cardiopulmonary disease. Electronically Signed   By: Elgie Collard M.D.   On: 10/12/2020 02:02    Cardiac Studies   Echo EF 60 to 65% in 2021.  Patient Profile     60 y.o. male here with unstable angina, known CAD CABG February 2021.  Assessment & Plan    Unstable angina CAD post CABG February 2021 -Cardiac  catheterization today. -Anxious.  Troponins have been negative. -Continuing aspirin Plavix isosorbide 120, amlodipine and Lopressor. -On IV heparin.  Hyperlipidemia and hypertension -LDL currently 30 but triglycerides were in the 310 range.  Vascepa 2 g twice daily should be added on discharge.  Continue with Crestor 20 mg a day.  Diabetes -Holding Metformin. -I think he would benefit from SGLT2 inhibitor on discharge.    For questions or updates, please contact [facility] Please consult [email or web address] for contact info under        Signed, Donato Schultz, MD  10/13/2020, 9:52 AM

## 2020-10-13 NOTE — Progress Notes (Signed)
Site area: rt groin fa sheath Site Prior to Removal:  Level 0 Pressure Applied For: 20 minutes Manual:   yes Patient Status During Pull:  stable Post Pull Site:  Level 0 Post Pull Instructions Given:  yes Post Pull Pulses Present: rt dp pulse palpable Dressing Applied:  Gauze and twegaderm Bedrest begins @ 1625 Comments:

## 2020-10-13 NOTE — Progress Notes (Signed)
ANTICOAGULATION CONSULT NOTE   Pharmacy Consult for Heparin Indication: chest pain/ACS  Allergies  Allergen Reactions  . Hctz [Hydrochlorothiazide] Other (See Comments)    Unknown rxn, "they just told me not to take it anymore"  . Lurasidone     Other reaction(s): Dystonia  . Seroquel [Quetiapine Fumarate] Other (See Comments)    Elevates liver enzymes     Patient Measurements: Height: 5\' 10"  (177.8 cm) Weight: 118.4 kg (261 lb) IBW/kg (Calculated) : 73 Heparin Dosing Weight: 99 kg  Vital Signs: Temp: 97.9 F (36.6 C) (03/21 0403) Temp Source: Oral (03/21 0403) BP: 155/94 (03/21 0403) Pulse Rate: 71 (03/21 0403)  Labs: Recent Labs    10/11/20 2359 10/12/20 0140 10/12/20 0346 10/12/20 1254 10/12/20 1436 10/12/20 1924 10/13/20 0515  HGB 15.1  --  14.4  --   --   --  13.6  HCT 44.5  --  43.3  --   --   --  40.6  PLT 223  --  206  --   --   --  192  HEPARINUNFRC  --   --   --    < > 0.20* 0.15* 0.33  CREATININE 0.69  --  0.78  --   --   --   --   TROPONINIHS 11 9  --   --   --   --   --    < > = values in this interval not displayed.    Estimated Creatinine Clearance: 131.4 mL/min (by C-G formula based on SCr of 0.78 mg/dL).   Medical History: Past Medical History:  Diagnosis Date  . Arthritis   . Current every day smoker   . Essential hypertension    On multiple medications  . 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 10/28/2019)  . Hyperlipidemia LDL goal <70    10/08/2019: TC 137, TG 168, HDL 40, LDL 66 on 80 mg atorvastatin.  . Major depressive disorder   . Multiple vessel coronary artery disease 08/26/2018   Cath:  pRCA 95% ulcerated plaque (large dominant vessel; ~ostial LAD 75% & 90% D1, mLAD 40%; p-m LCx 85%, m LCx 95%, d LCx 90% & 50%.  . Pancreatitis   . Poorly controlled diabetes mellitus (HCC)  with history of DKA    A1c was 15.5 at time of CABG, most recently 6.6 (March 2021)  . PTSD (post-traumatic  stress disorder)       Assessment: 60 y.o. M presents with UA. No anticoagulation prior to admission. Pharmacy consulted for heparin.  Plans for cath today -heparin level at goal, CBC stable    Goal of Therapy:  Heparin level 0.3-0.7 units/ml Monitor platelets by anticoagulation protocol: Yes   Plan:  -No heparin changes needed -Will follow plans post cath  60, PharmD Clinical Pharmacist **Pharmacist phone directory can now be found on [email or web address] ([email or web address]).  Listed under [facility].

## 2020-10-13 NOTE — Brief Op Note (Signed)
10/13/2020  3:55 PM  PATIENT:  Jacob Brady  60 y.o. male  PRE-OPERATIVE DIAGNOSIS:  unstable angina  POST-OPERATIVE DIAGNOSIS:  Severe native vessel disease, stable from before with extensive LCx disease now the LCx is occluded after AV groove circumflex just before OM1. ->  SVG-OM1-OM2 is widely patent, but the OM1 runoff is now subtotaled. Essentially occluded LAD after D1 wit h widely patent LIMA-LAD, SVG-D1. Extensive disease in the RCA with widely patent pedicled RIMA distal RCA Normal LVEF; LVEDP 16 mm despite having significant systemic hypertension in the 180s.  PROCEDURE:  Procedure(s): LEFT HEART CATH AND CORS/GRAFTS ANGIOGRAPHY (N/A)  -> Right Common Femoral Artery access -> sheath removed in PACU with manual pressure held for hemostasis.  SURGEON:  Surgeon(s) and Role:    * Marykay Lex, MD - Primary   PATIENT DISPOSITION:  PACU - hemodynamically stable.   Delay start of Pharmacological VTE agent (>24hrs) due to surgical blood loss or risk of bleeding: not applicable   Plan: Optimize medical management

## 2020-10-13 NOTE — Care Management (Addendum)
10-13-20 1333 Case Manager notified the [facility] Fees coordinator of arrival. Case Manager will follow for additional toc needs. Graves-Bigelow, Lamar Laundry, RN, BSN Case Manager

## 2020-10-14 ENCOUNTER — Other Ambulatory Visit: Payer: Self-pay | Admitting: Medical

## 2020-10-14 ENCOUNTER — Encounter (HOSPITAL_COMMUNITY): Payer: Self-pay | Admitting: Cardiology

## 2020-10-14 DIAGNOSIS — Z951 Presence of aortocoronary bypass graft: Secondary | ICD-10-CM

## 2020-10-14 DIAGNOSIS — I25119 Atherosclerotic heart disease of native coronary artery with unspecified angina pectoris: Secondary | ICD-10-CM

## 2020-10-14 LAB — CBC
HCT: 38.7 % — ABNORMAL LOW (ref 39.0–52.0)
Hemoglobin: 13.5 g/dL (ref 13.0–17.0)
MCH: 30.6 pg (ref 26.0–34.0)
MCHC: 34.9 g/dL (ref 30.0–36.0)
MCV: 87.8 fL (ref 80.0–100.0)
Platelets: 203 K/uL (ref 150–400)
RBC: 4.41 MIL/uL (ref 4.22–5.81)
RDW: 15.4 % (ref 11.5–15.5)
WBC: 11.7 K/uL — ABNORMAL HIGH (ref 4.0–10.5)
nRBC: 0 % (ref 0.0–0.2)

## 2020-10-14 LAB — GLUCOSE, CAPILLARY: Glucose-Capillary: 211 mg/dL — ABNORMAL HIGH (ref 70–99)

## 2020-10-14 MED ORDER — ROSUVASTATIN CALCIUM 20 MG PO TABS: 20.0000 mg | ORAL_TABLET | Freq: Every day | ORAL | 3 refills | Status: AC

## 2020-10-14 MED ORDER — EMPAGLIFLOZIN 10 MG PO TABS: 10.0000 mg | ORAL_TABLET | Freq: Every day | ORAL | 3 refills | Status: AC

## 2020-10-14 MED ORDER — CARVEDILOL 25 MG PO TABS: 25.0000 mg | ORAL_TABLET | Freq: Two times a day (BID) | ORAL | Status: DC

## 2020-10-14 MED ORDER — ICOSAPENT ETHYL 1 G PO CAPS: 2.0000 g | ORAL_CAPSULE | Freq: Two times a day (BID) | ORAL | 6 refills | Status: AC

## 2020-10-14 MED ORDER — ICOSAPENT ETHYL 1 G PO CAPS
2.0000 g | ORAL_CAPSULE | Freq: Two times a day (BID) | ORAL | Status: DC
  Administered 2020-10-14: 2 g via ORAL
  Filled 2020-10-14: qty 2

## 2020-10-14 MED ORDER — NITROGLYCERIN 0.4 MG SL SUBL: 0.4000 mg | SUBLINGUAL_TABLET | SUBLINGUAL | 3 refills | Status: AC | PRN

## 2020-10-14 MED ORDER — EMPAGLIFLOZIN 10 MG PO TABS
10.0000 mg | ORAL_TABLET | Freq: Every day | ORAL | Status: DC
  Administered 2020-10-14: 10 mg via ORAL
  Filled 2020-10-14: qty 1

## 2020-10-14 MED ORDER — DAPAGLIFLOZIN PROPANEDIOL 10 MG PO TABS
10.0000 mg | ORAL_TABLET | Freq: Every day | ORAL | Status: DC
  Filled 2020-10-14: qty 1

## 2020-10-14 MED ORDER — CARVEDILOL 25 MG PO TABS: 25.0000 mg | ORAL_TABLET | Freq: Two times a day (BID) | ORAL | 3 refills | Status: AC

## 2020-10-14 MED FILL — Heparin Sod (Porcine)-NaCl IV Soln 1000 Unit/500ML-0.9%: INTRAVENOUS | Qty: 1000 | Status: AC

## 2020-10-14 MED FILL — JARDIANCE 10 MG TABLET: 10 | 14 days supply | Qty: 14 | Fill #0

## 2020-10-14 MED FILL — NITROGLYCERIN 0.4 MG TAB SL: 0.4 | 7 days supply | Qty: 25 | Fill #0

## 2020-10-14 MED FILL — ROSUVASTATIN CALCIUM 20 MG: 20 | 30 days supply | Qty: 30 | Fill #0

## 2020-10-14 MED FILL — CARVEDILOL 25 MG TABLET: 25 | 30 days supply | Qty: 60 | Fill #0

## 2020-10-14 NOTE — Discharge Summary (Addendum)
Discharge Summary    Patient ID: Jacob Brady MRN: [id number]; DOB: 1961/01/08  Admit date: 10/11/2020 Discharge date: 10/14/2020  PCP:  Clinic, [facility]  Cardiologist:  Bryan Lemma, MD  Advanced Practice Provider:  No care team member to display Electrophysiologist:  None   Discharge Diagnoses    Principal Problem:   Chest pain Active Problems:   Essential hypertension   Diabetes mellitus type 2 in obese [facility])   Unstable angina (HCC)   S/P CABG x 5   Coronary artery disease involving native coronary artery of native heart with angina pectoris (HCC)   Hyperlipidemia LDL goal <70    Diagnostic Studies/Procedures    LHC 10/13/20:  ------------- NATIVE CORONARIES ---------  Ost LAD to Prox LAD lesion is 75% stenosed. Mid LAD-1 lesion is 99% stenosed. Mid LAD-2 lesion is 80% stenosed.  1st Diag lesion is 90% stenosed.  Ost Cx to Prox Cx lesion is 80% stenosed. Prox Cx lesion is 85% stenosed.  Prox Cx to Mid Cx lesion is 100% stenosed - after AVG Cx  1st Mrg lesion is 99% stenosed @ SVG anastomosis  Mid Cx lesion is 100% stenosed - after OM1. Dist Cx lesion is 50% stenosed before OM2  Prox RCA lesion is 95% stenosed. Prox RCA to Mid RCA lesion is 45% stenosed. Mid RCA to Dist RCA lesion is 99% stenosed.  -- GRAFTS --  LIMA-LAD graft was visualized by angiography and is normal in caliber. The graft exhibits no disease.  SVG-1st DIAG graft was visualized by angiography and is normal in caliber. The graft exhibits no disease.  Seq SVG- SVG-OM1-OM2 graft was visualized by angiography and is large. The graft exhibits no disease. -The insertion site on OM1 is subtotally occluded with minimal flow down the distal OM1.  RIMA-dRCA graft was visualized by angiography and is normal in caliber. The graft exhibits no disease. (very difficult to access)  The left ventricular systolic function is normal.  LV end diastolic  pressure is mildly elevated.  The left ventricular ejection fraction is 50-55% by visual estimate.  There is no aortic valve stenosis.  There is mild (2+) mitral regurgitation.   SUMMARY  Severe native vessel disease, stable from before with extensive LCx disease now the LCx is occluded after AV groove circumflex just before OM1. ->  SVG-OM1-OM2 is widely patent, but the OM1 runoff is now subtotaled.  Essentially occluded LAD after D1 with widely patent LIMA-LAD, SVG-D1.  Extensive disease in the RCA with widely patent pedicled RIMA distal RCA  Normal LVEF; LVEDP 16 mmHg despite having significant systemic hypertension in the 180s.  RECOMMENDATIONS  Return to nursing unit for ongoing care.  Medical management of symptoms including antihypertensive, consider long-acting nitrate, calcium blockers plus/minus Ranexa.  From a strictly post catheterization standpoint, would be okay to be discharged after bedrest, but he has medication that need to be titrated.  Diagnostic Dominance: Right       _____________   History of Present Illness     Jacob Brady is a 60 y.o. male with a PMH of CAD s/p CABG 08/2019, HTN, HLD, DM type 2, depression, and tobacco abuse, who presents for the evaluation of chest pain. He reported initially doing well following CABG and remained CP free for about 8 months afterwards. About 4 months ago he began experiencing intermittent chest pain, initially occurring 1-2 times per week and would improve with SL nitro. He reported symptoms felt similar to  those from before his CABG, however not as severe. Over the past few weeks or month his chest pain worsened, now 2-3 times per day and has been taking SLN to manage this with variable response. He was seen at the [facility] walk-in clinic recently and is scheduled to undergo a stress test at [facility]. Given how quickly his symptoms are worsening, he presented to the ED for further evaluation.  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 His troponin was negative x 2 and the remainder of his labs and vital signs were unremarkable.    Hospital Course     Consultants: None   1. Unstable angina in patient with CAD s/p CABG 08/2019: patient presented with progressive chest pain. HsTrop's were negative x2. EKG was non-ischemic. Symptoms were concerning for unstable angina. He was started on a heparin gtt and recommended to undergo a heart catheterization to further evaluate his symptoms. LHC 10/13/20 showed severe native vessel disease, stable from previous with extensive LCx disease now occluded after AV groove circumflex just before OM1 with patent SVG-OM1-OM2 (subtotal occlusion of OM1 runoff), patent LIMA-LAD, SVG-D1, and RIMA-dRCA. He was recommended for ongoing medical management.  - Continue aspirin and plavix - Continue imdur - Transitioned to carvedilol from metoprolol for improved BP control  - Continue amlodipine - Started on crestor and vascepa this admission for cholesterol/triglyceride control  2. HTN: BP generally above goal this admission. His metoprolol was transitioned to carvedilol for improved BP control - Continue amlodipine, carvedilol, hydralazine, imdur, and lisinopril - Could consider transition to valsartan or olmesartan outpatient if BP remains above goal  3. HLD: LDL 30, Triglycerides 310 on lipids this admission. He was started on crestor and vascepa - Continue crestor and vascepa - Plan to repeat FLP/LFTs in 6-8 weeks for close monitoring  4. DM type 2: A1C 7.5 this admission on home insulin and metformin. He was started on jardiance prior to discharge - Continue insulin, jardiance, and metformin (instructed to restart 10/16/20 following LHC) - Would repeat BMET at follow-up for close monitoring of renal function  5. Depression: home medications continued throughout admission - Continue effexor, trazodone, remeron,  depakote, klonopin, and buspirone    Did the patient have an acute coronary syndrome (MI, NSTEMI, STEMI, etc) this admission?:  No                               Did the patient have a percutaneous coronary intervention (stent / angioplasty)?:  No.       _____________  Discharge Vitals Blood pressure (!) 153/80, pulse 73, temperature 97.8 F (36.6 C), temperature source Oral, resp. rate 19, height  (1.778 m), weight 117.2 kg, SpO2 94 %.  Filed Weights   10/11/20 2338 10/13/20 0403 10/14/20 0501  Weight: 117.9 kg 118.4 kg 117.2 kg    Labs & Radiologic Studies    CBC Recent Labs    10/13/20 0515 10/14/20 0246  WBC 10.1 11.7*  HGB 13.6 13.5  HCT 40.6 38.7*  MCV 88.3 87.8  PLT 192 203   Basic Metabolic Panel Recent Labs    16/10/96 2359 10/12/20 0346  NA 136 134*  K 3.6 3.7  CL 103 101  CO2 22 24  GLUCOSE 144* 125*  BUN 13 12  CREATININE 0.69 0.78  CALCIUM 9.8 9.4   Liver Function Tests No results for  input(s): AST, ALT, ALKPHOS, BILITOT, PROT, ALBUMIN in the last 72 hours. No results for input(s): LIPASE, AMYLASE in the last 72 hours. High Sensitivity Troponin:   Recent Labs  Lab 10/11/20 2359 10/12/20 0140  TROPONINIHS 11 9    BNP Invalid input(s): POCBNP D-Dimer No results for input(s): DDIMER in the last 72 hours. Hemoglobin A1C Recent Labs    10/12/20 0331  HGBA1C 7.5*   Fasting Lipid Panel Recent Labs    10/12/20 0500  CHOL 125  HDL 33*  LDLCALC 30  TRIG 664*  CHOLHDL 3.8   Thyroid Function Tests No results for input(s): TSH, T4TOTAL, T3FREE, THYROIDAB in the last 72 hours.  Invalid input(s): FREET3 _____________  DG Chest 2 View  Result Date: 10/12/2020 CLINICAL DATA:  60 year old male with chest pain. EXAM: CHEST - 2 VIEW COMPARISON:  Chest radiograph dated 09/24/2019. FINDINGS: Left lung base linear atelectasis/scarring. No focal consolidation, pleural effusion or pneumothorax. Stable cardiac silhouette. Median sternotomy  wires. No acute osseous pathology. IMPRESSION: No active cardiopulmonary disease. Electronically Signed   By: Elgie Collard M.D.   On: 10/12/2020 02:02   CARDIAC CATHETERIZATION  Addendum Date: 10/14/2020    ------------- NATIVE CORONARIES ---------  Ost LAD to Prox LAD lesion is 75% stenosed. Mid LAD-1 lesion is 99% stenosed. Mid LAD-2 lesion is 80% stenosed.  1st Diag lesion is 90% stenosed.  Ost Cx to Prox Cx lesion is 80% stenosed. Prox Cx lesion is 85% stenosed.  Prox Cx to Mid Cx lesion is 100% stenosed - after AVG Cx  1st Mrg lesion is 99% stenosed @ SVG anastomosis  Mid Cx lesion is 100% stenosed - after OM1. Dist Cx lesion is 50% stenosed before OM2  Prox RCA lesion is 95% stenosed. Prox RCA to Mid RCA lesion is 45% stenosed. Mid RCA to Dist RCA lesion is 99% stenosed.  -- GRAFTS --  LIMA-LAD graft was visualized by angiography and is normal in caliber. The graft exhibits no disease.  SVG-1st DIAG graft was visualized by angiography and is normal in caliber. The graft exhibits no disease.  Seq SVG- SVG-OM1-OM2 graft was visualized by angiography and is large. The graft exhibits no disease. -The insertion site on OM1 is subtotally occluded with minimal flow down the distal OM1.  RIMA-dRCA graft was visualized by angiography and is normal in caliber. The graft exhibits no disease. (very difficult to access)  The left ventricular systolic function is normal.  LV end diastolic pressure is mildly elevated.  The left ventricular ejection fraction is 50-55% by visual estimate.  There is no aortic valve stenosis.  There is mild (2+) mitral regurgitation.  SUMMARY  Severe native vessel disease, stable from before with extensive LCx disease now the LCx is occluded after AV groove circumflex just before OM1. ->  SVG-OM1-OM2 is widely patent, but the OM1 runoff is now subtotaled.  Essentially occluded LAD after D1 with widely patent LIMA-LAD, SVG-D1.  Extensive disease in the RCA with widely  patent pedicled RIMA distal RCA  Normal LVEF; LVEDP 16 mmHg despite having significant systemic hypertension in the 180s. RECOMMENDATIONS  Return to nursing unit for ongoing care.  Medical management of symptoms including antihypertensive, consider long-acting nitrate, calcium blockers plus/minus Ranexa.  From a strictly post catheterization standpoint, would be okay to be discharged after bedrest, but he has medication that need to be titrated. Bryan Lemma, MD Bryan Lemma, M.D., M.S. Interventional Cardiologist Pager # [phone]   Result Date: 10/13/2020  ------------- NATIVE CORONARIES ---------  Suezanne Jacquet  LAD to Prox LAD lesion is 75% stenosed. Mid LAD-1 lesion is 99% stenosed. Mid LAD-2 lesion is 80% stenosed.  1st Diag lesion is 90% stenosed.  Ost Cx to Prox Cx lesion is 80% stenosed. Prox Cx lesion is 85% stenosed.  Prox Cx to Mid Cx lesion is 100% stenosed - after AVG Cx  1st Mrg lesion is 99% stenosed @ SVG anastomosis  Mid Cx lesion is 100% stenosed - after OM1. Dist Cx lesion is 50% stenosed before OM2  Prox RCA lesion is 95% stenosed. Prox RCA to Mid RCA lesion is 45% stenosed. Mid RCA to Dist RCA lesion is 99% stenosed.  -- GRAFTS --  LIMA-LAD graft was visualized by angiography and is normal in caliber. The graft exhibits no disease.  SVG-1st DIAG graft was visualized by angiography and is normal in caliber. The graft exhibits no disease.  Seq SVG- SVG-OM1-OM2 graft was visualized by angiography and is large. The graft exhibits no disease. -The insertion site on OM1 is subtotally occluded with minimal flow down the distal OM1.  RIMA-dRCA graft was visualized by angiography and is normal in caliber. The graft exhibits no disease. (very difficult to access)  The left ventricular systolic function is normal.  LV end diastolic pressure is mildly elevated.  The left ventricular ejection fraction is 50-55% by visual estimate.  There is no aortic valve stenosis.  There is mild (2+)  mitral regurgitation.  SUMMARY  Severe native vessel disease, stable from before with extensive LCx disease now the LCx is occluded after AV groove circumflex just before OM1. ->  SVG-OM1-OM2 is widely patent, but the OM1 runoff is now subtotaled.  Essentially occluded LAD after D1 with widely patent LIMA-LAD, SVG-D1.  Extensive disease in the RCA with widely patent pedicled RIMA distal RCA  Normal LVEF; LVEDP 16 mmHg despite having significant systemic hypertension in the 180s. RECOMMENDATIONS  Return to nursing unit for ongoing care.  Medical management of symptoms including antihypertensive, consider long-acting nitrate, calcium blockers plus/minus Ranexa.  From a strictly post catheterization standpoint, would be okay to be discharged after bedrest, but he has medication that need to be titrated. Bryan Lemmaavid Harding, MD Bryan Lemmaavid Harding, M.D., M.S. Interventional Cardiologist Pager # [phone]   Disposition   Pt is being discharged home today in good condition.  Follow-up Plans & Appointments     Follow-up Information    Ronney AstersCleaver, Jesse M, NP Follow up on 11/03/2020.   Specialty: Cardiology Why: Please arrive 15 minutes early for your 2:15pm post-hospital cardiology appointment Contact information: [address] [place] [address] [phone]                Discharge Medications   Allergies as of 10/14/2020      Reactions   Hctz [hydrochlorothiazide] Other (See Comments)   Unknown rxn, "they just told me not to take it anymore"   Lurasidone    Other reaction(s): Dystonia   Seroquel [quetiapine Fumarate] Other (See Comments)   Elevates liver enzymes      Medication List    STOP taking these medications   metoprolol tartrate 50 MG tablet Commonly known as: LOPRESSOR     TAKE these medications   amLODipine 10 MG tablet Commonly known as: NORVASC Take 10 mg by mouth daily.   aspirin EC 81 MG tablet Take 81 mg by mouth daily.   busPIRone 10 MG  tablet Commonly known as: BUSPAR Take 10 mg by mouth 2 (two) times daily.   carvedilol 25 MG  tablet Commonly known as: COREG Take 1 tablet (25 mg total) by mouth 2 (two) times daily with a meal.   Cholecalciferol 50 MCG (2000 UT) Tabs Take 2,000 Units by mouth daily.   clonazePAM 0.5 MG tablet Commonly known as: KLONOPIN Take 0.5 mg by mouth 2 (two) times daily as needed for anxiety.   clopidogrel 75 MG tablet Commonly known as: PLAVIX Take 1 tablet (75 mg total) by mouth daily. Please send any [facility]ill [facility]Dr Jess Barters   divalproex 500 MG 24 hr tablet Commonly known as: DEPAKOTE ER Take 1,000 mg by mouth at bedtime.   empagliflozin 10 MG Tabs tablet Commonly known as: JARDIANCE Take 1 tablet (10 mg total) by mouth daily.   gabapentin 300 MG capsule Commonly known as: NEURONTIN Take 900 mg by mouth at bedtime.   hydrALAZINE 100 MG tablet Commonly known as: APRESOLINE Take 100 mg by mouth 3 (three) times daily.   icosapent Ethyl 1 g capsule Commonly known as: VASCEPA Take 2 capsules (2 g total) by mouth 2 (two) times daily.   insulin NPH-regular Human (70-30) 100 UNIT/ML injection Inject 10-30 Units into the skin See admin instructions. Sliding scale before meals   isosorbide mononitrate 120 MG 24 hr tablet Commonly known as: IMDUR Take 120 mg by mouth daily.   lisinopril 40 MG tablet Commonly known as: ZESTRIL Take 40 mg by mouth daily.   metFORMIN 1000 MG tablet Commonly known as: GLUCOPHAGE Take 1,000 mg by mouth 2 (two) times daily.   mirtazapine 15 MG tablet Commonly known as: REMERON Take 7.5 mg by mouth daily.   nitroGLYCERIN 0.4 MG SL tablet Commonly known as: NITROSTAT Place 1 tablet (0.4 mg total) under the tongue every 5 (five) minutes as needed for chest pain. What changed:   when to take this  reasons to take this   rosuvastatin 20 MG tablet Commonly known as: CRESTOR Take 1 tablet (20 mg total) by mouth daily. Start  taking on: October 15, 2020   traZODone 100 MG tablet Commonly known as: DESYREL Take 200 mg by mouth at bedtime.   venlafaxine XR 150 MG 24 hr capsule Commonly known as: EFFEXOR-XR Take 150 mg by mouth daily.          Outstanding Labs/Studies  Repeat BMET outpatient to ensure Cr stable after starting Jardiance  Will need repeat FLP/LFTs in 6-8 weeks  Duration of Discharge Encounter   Greater than 30 minutes including physician time.  Signed, Beatriz Stallion, PA-C 10/14/2020, 8:38 AM   Donato Schultz, MD   60 year old with coronary disease post CABG with catheterization reviewed as above.  Continuing with medical management.  Ready for discharge.  Feeling well this morning. Changing metoprolol over to carvedilol Adding Jardiance, SGLT2 inhibitor Continuing other medications including isosorbide 120.  Cath site clean dry and intact. Regular rate and rhythm.  Donato Schultz, MD

## 2020-10-14 NOTE — Discharge Instructions (Signed)
PLEASE REMEMBER TO BRING ALL OF YOUR MEDICATIONS TO EACH OF YOUR FOLLOW-UP OFFICE VISITS.  PLEASE ATTEND ALL SCHEDULED FOLLOW-UP APPOINTMENTS.   Activity: Increase activity slowly as tolerated. You may shower, but no soaking baths (or swimming) for 1 week. No driving for 24 hours. No lifting over 5 lbs for 1 week. No sexual activity for 1 week.   You May Return to Work: in 1 week (if applicable)  Wound Care: You may wash cath site gently with soap and water. Keep cath site clean and dry. If you notice pain, swelling, bleeding or pus at your cath site, please call [phone].   Medication changes: - STOP metoprolol tartrate 50mg  two times per day - this medication is being replaced with carvedilol - START carvedilol (coreg) 25mg  two times per day - this medication works similarly to metoprolol, though has a better effect on the blood pressure - START icosapent ethyl (Vascepa) 1g - take 2 capsules (2g) 2 times per day - this medication works to lower your triglyceride levels - START rosuvastatin (crestor) 20mg  daily - take 1 tablet daily at bedtime - this medication works to lower your cholesterol levels - START empagliflozin (Jardiance) 10mg  daily - take one tablet daily - this medication works to lower heart failure risk in patient with heart disease and diabetes.  - RESTART your metformin on 10/16/20 as previously prescribed. We delay restarting this medication to avoid damage to your kidneys following a heart catheterization

## 2020-11-03 ENCOUNTER — Ambulatory Visit: Payer: No Typology Code available for payment source | Admitting: General Practice

## 2021-03-25 IMAGING — DX DG CHEST 2V
2 series · 2 of 2 positions shown · non-contrast
Comparison: Radiograph 8058

CLINICAL DATA: Chest pain, intermittent since yesterday

EXAM:
CHEST - 2 VIEW

[chest pa]
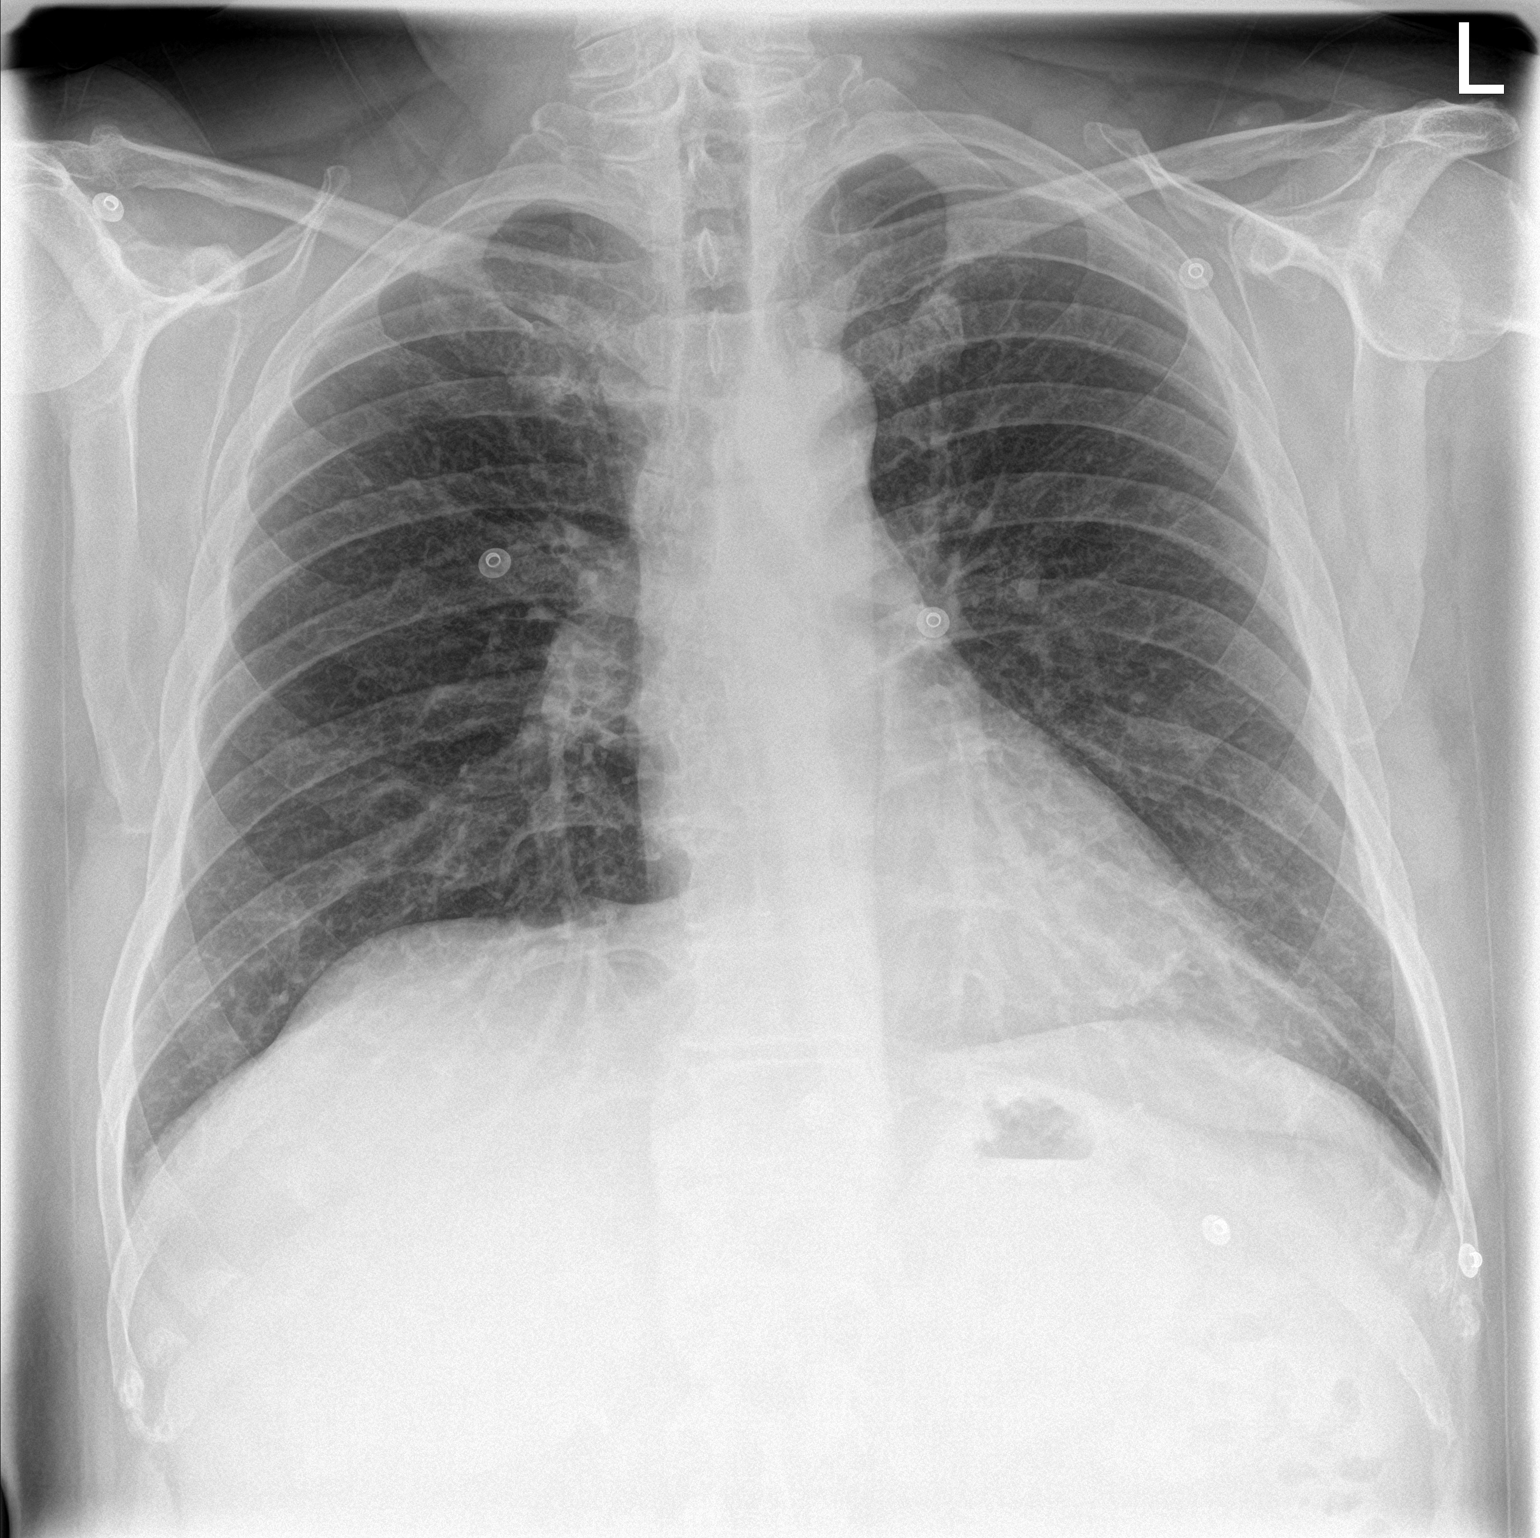

[chest lat]
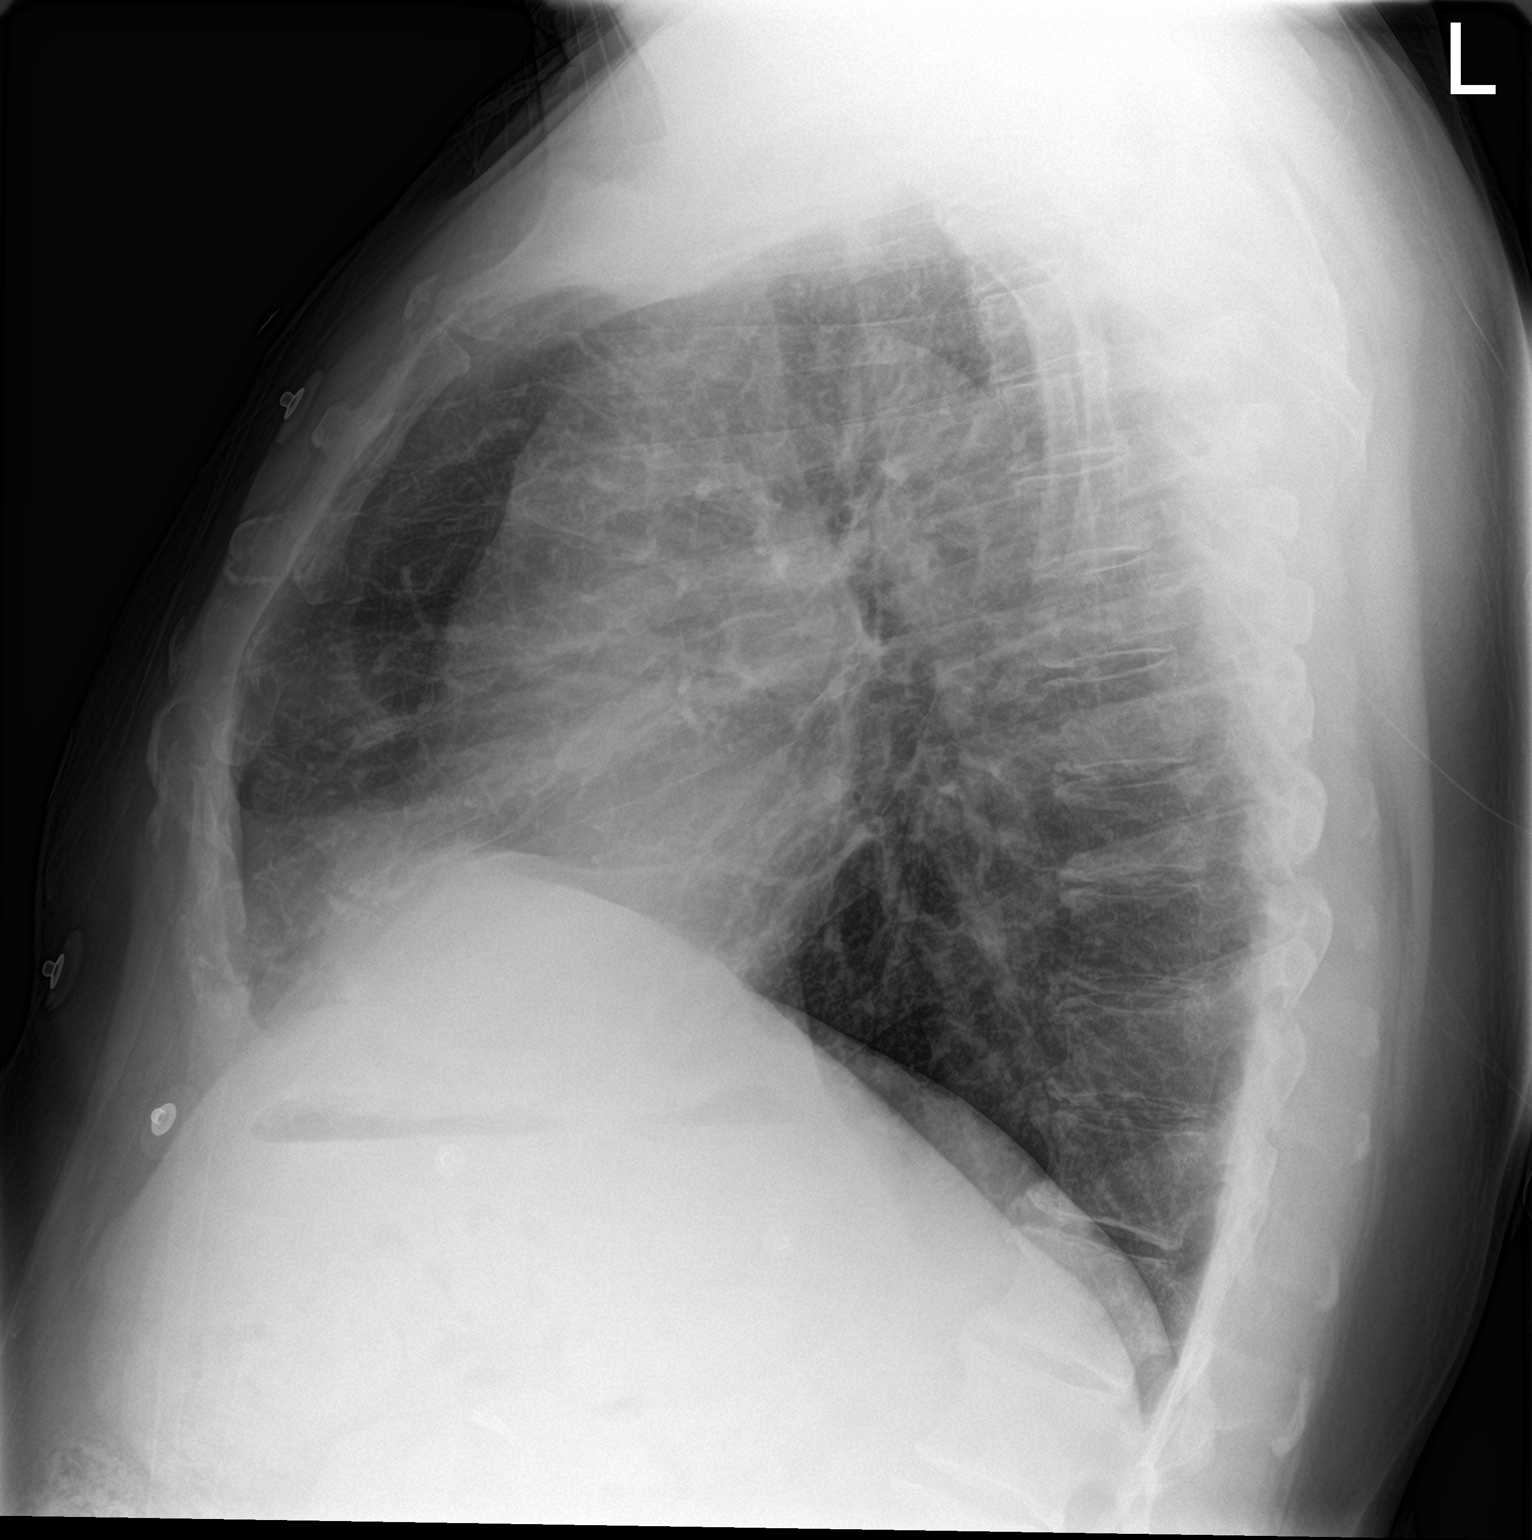

[2 of 2 positions shown; findings below may reference images not displayed]

FINDINGS: Few streaky basilar atelectatic changes. No consolidation, features
of edema, pneumothorax, or effusion. Pulmonary vascularity is
normally distributed. The cardiomediastinal contours are
unremarkable. No acute osseous or soft tissue abnormality.
Degenerative changes are present in the imaged spine and shoulders.
IMPRESSION: Streaky bibasilar atelectatic changes. Otherwise no acute
cardiopulmonary disease.

## 2021-03-31 IMAGING — CR DG CHEST 2V
2 series · 2 of 2 positions shown · non-contrast
Comparison: 08/07/2018

CLINICAL DATA: Chest pain

EXAM:
CHEST - 2 VIEW

[chest pa]
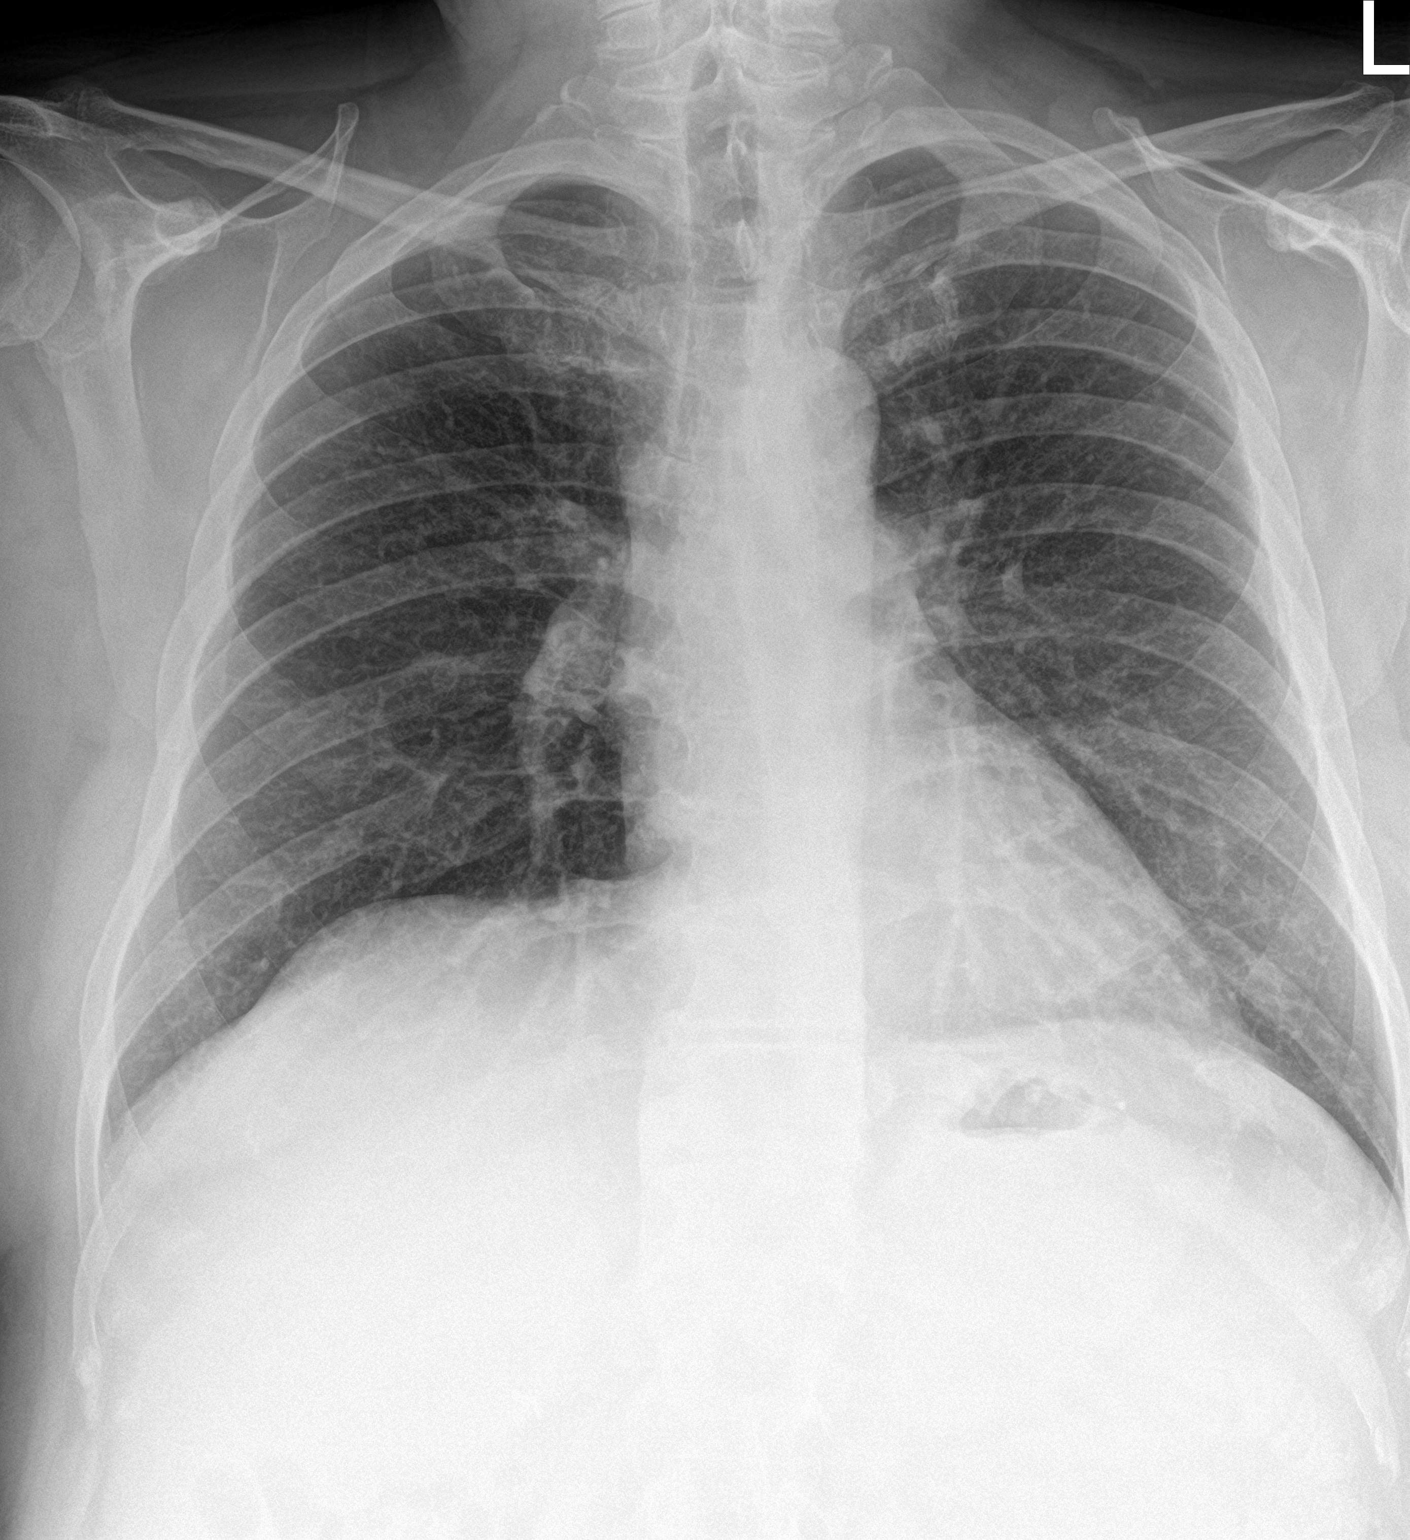

[chest lat]
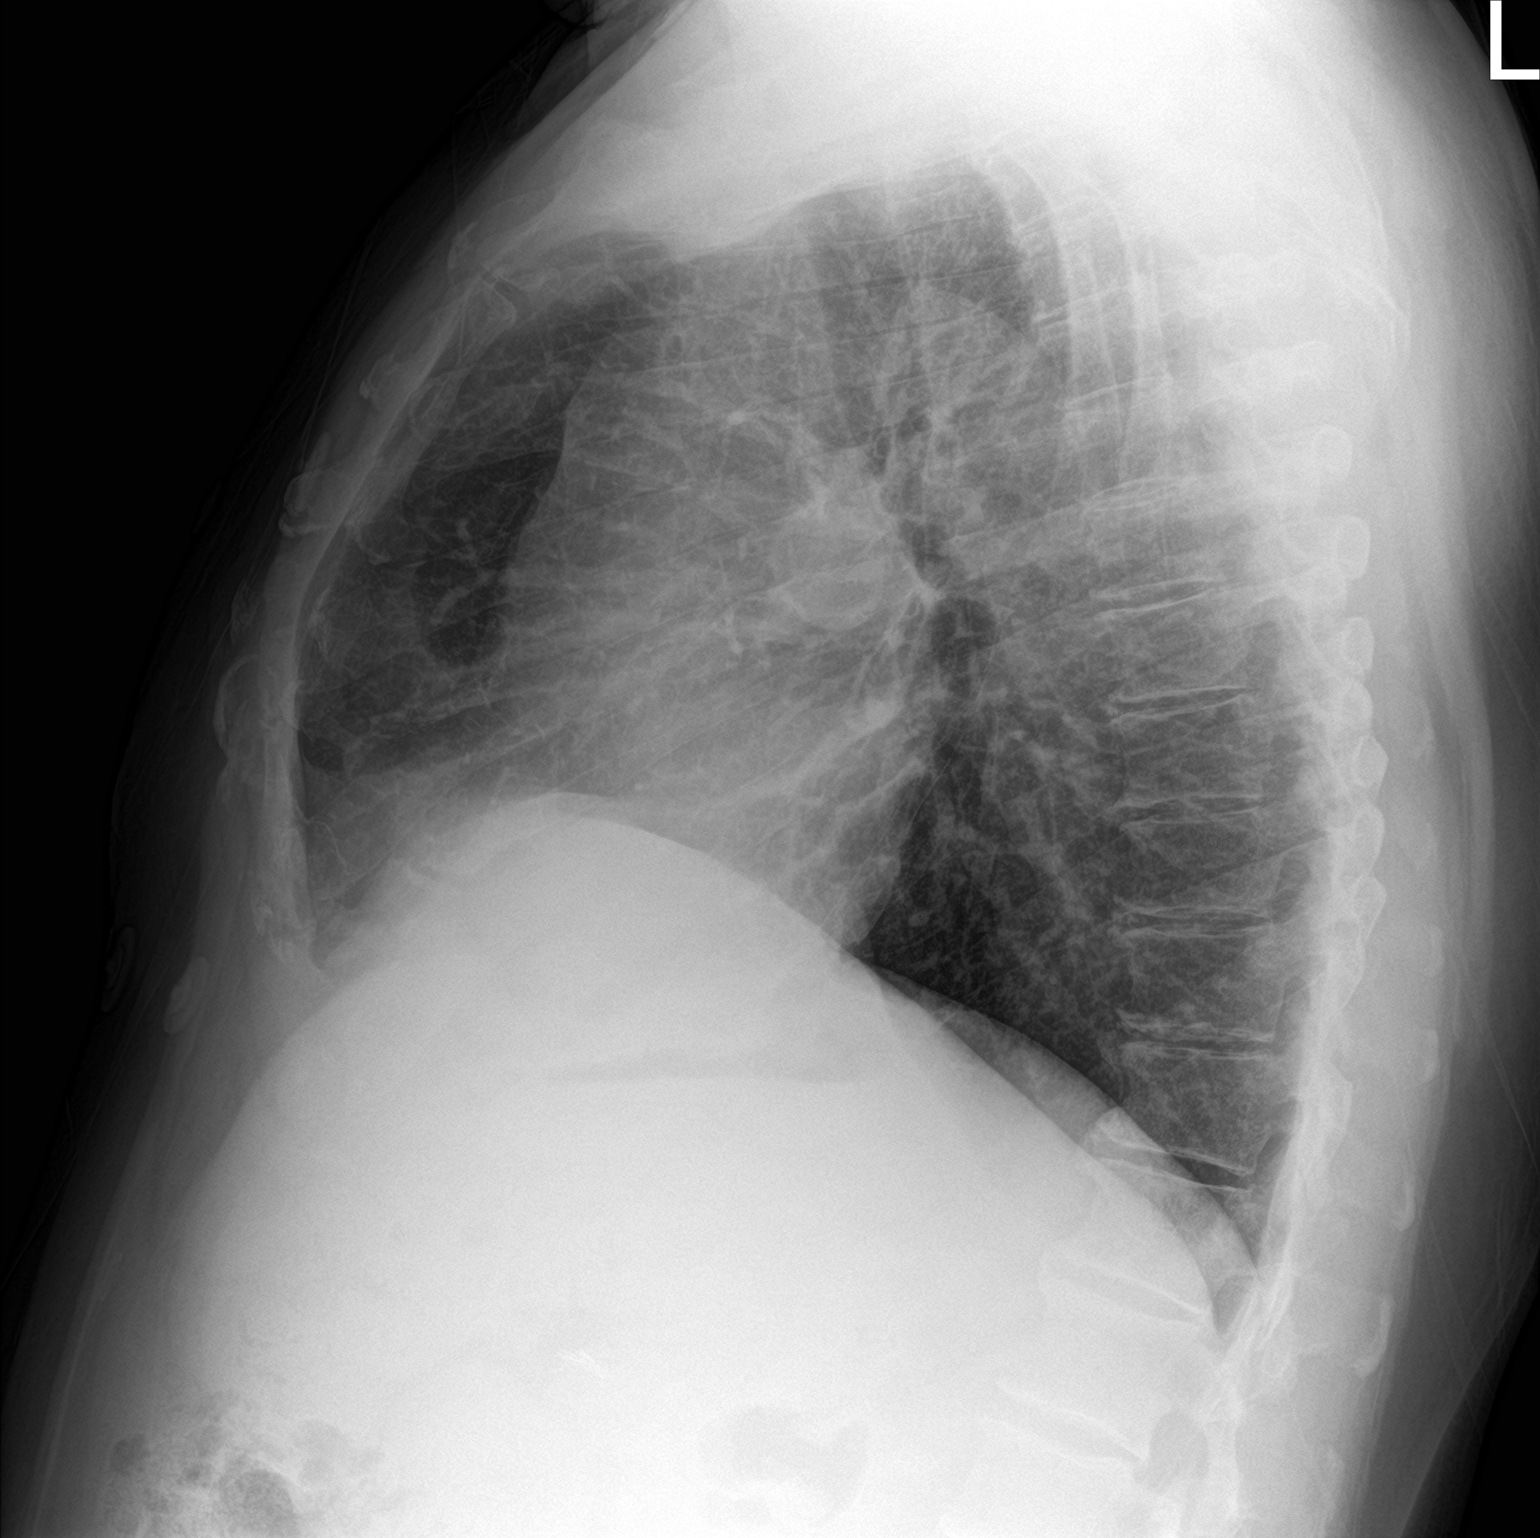

[2 of 2 positions shown; findings below may reference images not displayed]

FINDINGS: The heart size and mediastinal contours are within normal limits.
Both lungs are clear. The visualized skeletal structures are
unremarkable.
IMPRESSION: No active cardiopulmonary disease.

## 2024-07-01 ENCOUNTER — Encounter (HOSPITAL_BASED_OUTPATIENT_CLINIC_OR_DEPARTMENT_OTHER): Payer: Self-pay | Admitting: Emergency Medicine

## 2024-07-01 ENCOUNTER — Emergency Department (HOSPITAL_BASED_OUTPATIENT_CLINIC_OR_DEPARTMENT_OTHER)
Admission: EM | Admit: 2024-07-01 | Discharge: 2024-07-02 | Disposition: A | Attending: Emergency Medicine | Admitting: Emergency Medicine

## 2024-07-01 ENCOUNTER — Other Ambulatory Visit: Payer: Self-pay

## 2024-07-01 DIAGNOSIS — Z79899 Other long term (current) drug therapy: Secondary | ICD-10-CM | POA: Insufficient documentation

## 2024-07-01 DIAGNOSIS — I2510 Atherosclerotic heart disease of native coronary artery without angina pectoris: Secondary | ICD-10-CM | POA: Insufficient documentation

## 2024-07-01 DIAGNOSIS — K858 Other acute pancreatitis without necrosis or infection: Secondary | ICD-10-CM

## 2024-07-01 DIAGNOSIS — Z87891 Personal history of nicotine dependence: Secondary | ICD-10-CM | POA: Insufficient documentation

## 2024-07-01 DIAGNOSIS — K859 Acute pancreatitis without necrosis or infection, unspecified: Secondary | ICD-10-CM | POA: Diagnosis not present

## 2024-07-01 DIAGNOSIS — Z7984 Long term (current) use of oral hypoglycemic drugs: Secondary | ICD-10-CM | POA: Insufficient documentation

## 2024-07-01 DIAGNOSIS — I10 Essential (primary) hypertension: Secondary | ICD-10-CM | POA: Diagnosis not present

## 2024-07-01 DIAGNOSIS — R1012 Left upper quadrant pain: Secondary | ICD-10-CM | POA: Diagnosis present

## 2024-07-01 DIAGNOSIS — Z951 Presence of aortocoronary bypass graft: Secondary | ICD-10-CM | POA: Insufficient documentation

## 2024-07-01 DIAGNOSIS — Z794 Long term (current) use of insulin: Secondary | ICD-10-CM | POA: Insufficient documentation

## 2024-07-01 DIAGNOSIS — E119 Type 2 diabetes mellitus without complications: Secondary | ICD-10-CM | POA: Insufficient documentation

## 2024-07-01 LAB — CBC WITH DIFFERENTIAL/PLATELET
Abs Immature Granulocytes: 0.03 K/uL (ref 0.00–0.07)
Basophils Absolute: 0 K/uL (ref 0.0–0.1)
Basophils Relative: 0 %
Eosinophils Absolute: 0.3 K/uL (ref 0.0–0.5)
Eosinophils Relative: 3 %
HCT: 39.7 % (ref 39.0–52.0)
Hemoglobin: 13.4 g/dL (ref 13.0–17.0)
Immature Granulocytes: 0 %
Lymphocytes Relative: 18 %
Lymphs Abs: 1.6 K/uL (ref 0.7–4.0)
MCH: 31 pg (ref 26.0–34.0)
MCHC: 33.8 g/dL (ref 30.0–36.0)
MCV: 91.9 fL (ref 80.0–100.0)
Monocytes Absolute: 0.8 K/uL (ref 0.1–1.0)
Monocytes Relative: 9 %
Neutro Abs: 6.3 K/uL (ref 1.7–7.7)
Neutrophils Relative %: 70 %
Platelets: 191 K/uL (ref 150–400)
RBC: 4.32 MIL/uL (ref 4.22–5.81)
RDW: 13.2 % (ref 11.5–15.5)
WBC: 9.1 K/uL (ref 4.0–10.5)
nRBC: 0 % (ref 0.0–0.2)

## 2024-07-01 LAB — COMPREHENSIVE METABOLIC PANEL WITH GFR
ALT: 12 U/L (ref 0–44)
AST: 13 U/L — ABNORMAL LOW (ref 15–41)
Albumin: 3.9 g/dL (ref 3.5–5.0)
Alkaline Phosphatase: 83 U/L (ref 38–126)
Anion gap: 11 (ref 5–15)
BUN: 10 mg/dL (ref 8–23)
CO2: 27 mmol/L (ref 22–32)
Calcium: 10.2 mg/dL (ref 8.9–10.3)
Chloride: 98 mmol/L (ref 98–111)
Creatinine, Ser: 0.8 mg/dL (ref 0.61–1.24)
GFR, Estimated: >60 mL/min (ref >60)
Glucose, Bld: 181 mg/dL — ABNORMAL HIGH (ref 70–99)
Potassium: 4.1 mmol/L (ref 3.5–5.1)
Sodium: 137 mmol/L (ref 135–145)
Total Bilirubin: 0.3 mg/dL (ref 0.0–1.2)
Total Protein: 7.6 g/dL (ref 6.5–8.1)

## 2024-07-01 LAB — LIPASE, BLOOD: Lipase: 153 U/L — ABNORMAL HIGH (ref 11–51)

## 2024-07-01 NOTE — ED Triage Notes (Signed)
 Pt c/o LUQ pain x 1 wk; hx of pancreatitis and sts feels the same

## 2024-07-02 LAB — URINALYSIS, ROUTINE W REFLEX MICROSCOPIC
Glucose, UA: NEGATIVE mg/dL
Hgb urine dipstick: NEGATIVE
Ketones, ur: 15 mg/dL — AB
Leukocytes,Ua: NEGATIVE
Nitrite: NEGATIVE
Protein, ur: 100 mg/dL — AB
Specific Gravity, Urine: 1.03 (ref 1.005–1.030)
pH: 6 (ref 5.0–8.0)

## 2024-07-02 LAB — URINALYSIS, MICROSCOPIC (REFLEX)

## 2024-07-02 MED ORDER — OXYCODONE HCL 5 MG PO TABS: 2.5000 mg | ORAL_TABLET | Freq: Four times a day (QID) | ORAL | 0 refills | Status: AC | PRN

## 2024-07-02 MED ORDER — ONDANSETRON 4 MG PO TBDP: 4.0000 mg | ORAL_TABLET | Freq: Three times a day (TID) | ORAL | 0 refills | Status: AC | PRN

## 2024-07-02 MED ORDER — KETOROLAC TROMETHAMINE 15 MG/ML IJ SOLN
15.0000 mg | Freq: Once | INTRAMUSCULAR | Status: AC
  Administered 2024-07-02: 15 mg via INTRAVENOUS
  Filled 2024-07-02: qty 1

## 2024-07-02 MED ORDER — ALUM & MAG HYDROXIDE-SIMETH 200-200-20 MG/5ML PO SUSP
30.0000 mL | Freq: Once | ORAL | Status: AC
  Administered 2024-07-02: 30 mL via ORAL
  Filled 2024-07-02: qty 30

## 2024-07-02 MED ORDER — LIDOCAINE VISCOUS HCL 2 % MT SOLN
15.0000 mL | Freq: Once | OROMUCOSAL | Status: AC
  Administered 2024-07-02: 15 mL via ORAL
  Filled 2024-07-02: qty 15

## 2024-07-02 NOTE — ED Provider Notes (Signed)
 [place] [facility] Provider Note  CSN: [id number] Arrival date & time: 07/01/24 2023  Chief Complaint(s) Abdominal Pain  HPI & MDM Jacob Brady is a 63 y.o. male here for   The history is provided by the patient.  Abdominal Pain  Patient has a past medical history of diet induced pancreatitis.  He presents with left upper quadrant abdominal pain for 1 week.  Gradually worsening.  Worse with certain positions and lying down.  Also worse with eating.  Denies any alcohol use.  No fevers or chills.  No cough or congestion.  No nausea or vomiting.  No diarrhea.  No other physical complaints.  Left upper quadrant abdominal pain differential diagnosis considered and workup below.  CBC with no leukocytosis. CMP without significant electrolyte derangements or renal sufficiency.  Hyperglycemia without DKA.  No evidence of bili obstruction.  Lipase is 150 consistent with mild pancreatitis. UA not concerning for infection.  GI cocktail did not relieve symptoms.  Does not appear to have GERD/gastritis associated.  Doubt cardiac or pulmonary process. Doubt other serious intra-abdominal inflammatory/infectious process requiring CT imaging at this time.  Provided with IV Toradol .  He is tolerating p.o.  Will DC with short course of antiemetics and Oxy.    Medical Decision Making Amount and/or Complexity of Data Reviewed Labs: ordered. Decision-making details documented in ED Course.  Risk OTC drugs. Prescription drug management.    Final Clinical Impression(s) / ED Diagnoses Final diagnoses:  Other acute pancreatitis, unspecified complication status   The patient appears reasonably screened and/or stabilized for discharge and I doubt any other medical condition or other [facility] requiring further screening, evaluation, or treatment in the ED at this time. I have discussed the findings, Dx and Tx plan with the patient/family who expressed  understanding and agree(s) with the plan. Discharge instructions discussed at length. The patient/family was given strict return precautions who verbalized understanding of the instructions. No further questions at time of discharge.  Disposition: Discharge  Condition: Good  ED Discharge Orders          Ordered    oxyCODONE  (ROXICODONE ) 5 MG immediate release tablet  Every 6 hours PRN        07/02/24 0120    ondansetron  (ZOFRAN -ODT) 4 MG disintegrating tablet  Every 8 hours PRN        07/02/24 0120            [place]  narcotic database reviewed and no active prescriptions noted.   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 Latisha)   Hyperlipidemia LDL goal <70    10/08/2019: TC 137, TG 168, HDL 40, LDL 66 on 80 mg atorvastatin .   Major depressive disorder    Multiple vessel coronary artery disease 08/26/2018   Cath:  pRCA 95% ulcerated plaque (large dominant vessel; ~ostial LAD 75% & 90% D1, mLAD 40%; p-m LCx 85%, m LCx 95%, d LCx 90% & 50%.   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   Suicide ideation 08/27/2019   Unstable angina (HCC)    Back pain 08/06/2019   DKA, type 2 (HCC) 08/05/2019   History of pancreatitis 08/05/2019   Severe recurrent major depression without psychotic features (HCC) 01/03/2019   MDD (major  depressive disorder), recurrent episode, severe (HCC) 01/03/2019   Coronary artery disease involving native coronary artery of native heart with angina pectoris 08/26/2018   Essential hypertension 08/03/2015   Type 2 diabetes mellitus with obesity 08/03/2015   PTSD (post-traumatic stress disorder) 08/03/2015   Abdominal aortic aneurysm (AAA) 08/03/2015   Home Medication(s) Prior to Admission medications   Medication Sig Start Date End Date Taking? Authorizing Provider  ondansetron  (ZOFRAN -ODT) 4 MG disintegrating tablet Take 1 tablet (4 mg total) by mouth every 8 (eight) hours as needed for up to 3 days for nausea or vomiting. 07/02/24 07/05/24 Yes Rhyan Radler, Raynell Moder, MD  oxyCODONE  (ROXICODONE ) 5 MG immediate release tablet Take 0.5-1 tablets (2.5-5 mg total) by mouth every 6 (six) hours as needed for up to 5 days for severe pain (pain score 7-10). 07/02/24 07/07/24 Yes Haeleigh Streiff, Raynell Moder, MD  amLODipine  (NORVASC ) 10 MG tablet Take 10 mg by mouth daily.    [provider]  aspirin  EC 81 MG tablet Take 81 mg by mouth daily.    [provider]  busPIRone  (BUSPAR ) 10 MG tablet Take 10 mg by mouth 2 (two) times daily.     [provider]  carvedilol  (COREG ) 25 MG tablet Take 1 tablet (25 mg total) by mouth 2 (two) times daily with a meal. 10/14/20   Kroeger, Bruno HERO., PA-C  carvedilol  (COREG ) 25 MG tablet TAKE 1 TABLET (25 MG TOTAL) BY MOUTH TWO TIMES DAILY WITH A MEAL. 10/14/20 10/14/21  Kroeger, Bruno HERO., PA-C  Cholecalciferol  50 MCG (2000 UT) TABS Take 2,000 Units by mouth daily. 03/06/20   [provider]  clonazePAM  (KLONOPIN ) 0.5 MG tablet Take 0.5 mg by mouth 2 (two) times daily as needed for anxiety. 09/17/20   [provider]  clopidogrel  (PLAVIX ) 75 MG tablet Take 1 tablet (75 mg total) by mouth daily. [facility]Dr Delana 09/27/19   German Bartlett PEDLAR, MD  divalproex  (DEPAKOTE  ER) 500 MG 24 hr tablet Take  1,000 mg by mouth at bedtime. 08/21/20   [provider]  empagliflozin  (JARDIANCE ) 10 MG TABS tablet Take 1 tablet (10 mg total) by mouth daily. 10/14/20   Kroeger, Krista M., PA-C  gabapentin  (NEURONTIN ) 300 MG capsule Take 900 mg by mouth at bedtime.     [provider]  hydrALAZINE  (APRESOLINE ) 100 MG tablet Take 100 mg by mouth 3 (three) times daily. 08/30/20   [provider]  icosapent  Ethyl (VASCEPA ) 1 g capsule Take 2 capsules (2 g total) by mouth 2 (two) times daily. 10/14/20   Kroeger, Krista M., PA-C  icosapent  Ethyl (VASCEPA ) 1 g capsule TAKE 2 CAPSULES (2 G TOTAL) BY MOUTH TWO TIMES DAILY. 10/14/20 10/14/21  Kroeger, Krista M., PA-C  insulin  NPH-regular Human (70-30) 100 UNIT/ML injection Inject 10-30 Units into the skin See admin instructions. Sliding scale before meals    [provider]  isosorbide  mononitrate (IMDUR ) 120 MG 24 hr tablet Take 120 mg  by mouth daily. 09/21/20   [provider]  lisinopril  (ZESTRIL ) 40 MG tablet Take 40 mg by mouth daily.    [provider]  metFORMIN  (GLUCOPHAGE ) 1000 MG tablet Take 1,000 mg by mouth 2 (two) times daily. 09/12/20   [provider]  mirtazapine  (REMERON ) 15 MG tablet Take 7.5 mg by mouth daily. 08/21/20   [provider]  nitroGLYCERIN  (NITROSTAT ) 0.4 MG SL tablet Place 1 tablet (0.4 mg total) under the tongue every 5 (five) minutes as needed for chest pain. 10/14/20 10/15/21  Kroeger, Krista M., PA-C  nitroGLYCERIN  (NITROSTAT ) 0.4 MG SL tablet PLACE 1 TABLET (0.4 MG TOTAL) UNDER THE TONGUE EVERY FIVE MINUTES AS NEEDED FOR CHEST PAIN. 10/14/20 10/14/21  Kroeger, Krista M., PA-C  rosuvastatin  (CRESTOR ) 20 MG tablet Take 1 tablet (20 mg total) by mouth daily. 10/15/20   Kroeger, Krista M., PA-C  rosuvastatin  (CRESTOR ) 20 MG tablet TAKE 1 TABLET (20 MG TOTAL) BY MOUTH DAILY. 10/14/20 10/14/21  Kroeger, Krista M., PA-C  traZODone  (DESYREL ) 100 MG tablet Take 200 mg by mouth at bedtime.     [provider]  venlafaxine  XR (EFFEXOR -XR) 150 MG 24 hr capsule Take 150 mg by mouth daily. 08/21/20   [provider]                                                                                                                                    Allergies Hctz [hydrochlorothiazide], Lurasidone, and Seroquel [quetiapine fumarate]  Review of Systems Review of Systems  Gastrointestinal:  Positive for abdominal pain.   As noted in HPI  Physical Exam Vital Signs  I have reviewed the triage vital signs BP 131/69   Pulse 64   Temp 98.2 F (36.8 C) (Oral)   Resp 18   Ht 5' 10 (1.778 m)   Wt 102.1 kg   SpO2 97%   BMI 32.28 kg/m   Physical Exam Vitals reviewed.  Constitutional:      General: He is not in acute distress.    Appearance: He is well-developed. He is obese. He is not diaphoretic.  HENT:     Head: Normocephalic and atraumatic.     Right Ear: External ear normal.     Left Ear: External ear normal.     Nose: Nose normal.     Mouth/Throat:     Mouth: Mucous membranes are moist.  Eyes:     General: No scleral icterus.    Conjunctiva/sclera: Conjunctivae normal.  Neck:     Trachea: Phonation normal.  Cardiovascular:     Rate and Rhythm: Normal rate and regular rhythm.  Pulmonary:     Effort: Pulmonary effort is normal. No respiratory distress.     Breath sounds: No stridor.  Abdominal:     General: There is no distension.     Tenderness: There is abdominal tenderness in the left upper quadrant.  Musculoskeletal:  General: Normal range of motion.     Cervical back: Normal range of motion.  Neurological:     Mental Status: He is alert and oriented to person, place, and time.  Psychiatric:        Behavior: Behavior normal.     ED Results and Treatments Labs (all labs ordered are listed, but only abnormal results are displayed) Labs Reviewed  COMPREHENSIVE METABOLIC PANEL WITH GFR - Abnormal; Notable for the following components:       Result Value   Glucose, Bld 181 (*)    AST 13 (*)    All other components within normal limits  LIPASE, BLOOD - Abnormal; Notable for the following components:   Lipase 153 (*)    All other components within normal limits  URINALYSIS, ROUTINE W REFLEX MICROSCOPIC - Abnormal; Notable for the following components:   Bilirubin Urine SMALL (*)    Ketones, ur 15 (*)    Protein, ur 100 (*)    All other components within normal limits  URINALYSIS, MICROSCOPIC (REFLEX) - Abnormal; Notable for the following components:   Bacteria, UA RARE (*)    All other components within normal limits  CBC WITH DIFFERENTIAL/PLATELET                                                                                                                         EKG  EKG Interpretation Date/Time:  Sunday July 01 2024 20:39:40 EST Ventricular Rate:  74 PR Interval:  192 QRS Duration:  168 QT Interval:  443 QTC Calculation: 492 R Axis:   -67  Text Interpretation: Sinus rhythm IVCD, consider atypical RBBB LVH with IVCD and secondary repol abnrm Borderline prolonged QT interval No significant change was found Confirmed by Trine Likes ([id number]) on 07/02/2024 1:37:51 AM       Radiology No results found.  Medications Ordered in ED Medications  alum & mag hydroxide-simeth (MAALOX/MYLANTA) 200-200-20 MG/5ML suspension 30 mL (30 mLs Oral Given 07/02/24 0043)    And  lidocaine  (XYLOCAINE ) 2 % viscous mouth solution 15 mL (15 mLs Oral Given 07/02/24 0043)  ketorolac  (TORADOL ) 15 MG/ML injection 15 mg (15 mg Intravenous Given 07/02/24 0123)   Procedures Procedures  (including critical care time)   This chart was dictated using voice recognition software.  Despite best efforts to proofread,  errors can occur which can change the documentation meaning.   Trine Likes Moder, MD 07/02/24 [phone]

## 2024-07-02 NOTE — ED Notes (Signed)
Pt ambulatory to bathroom for urine sample.

## 2024-07-02 NOTE — Discharge Instructions (Addendum)
 To help manage your pancreatitis begin with clear liquid diet for the next 3 to 4 days.  Then progressed to soft diet that does not include high sugar or fatty foods and do so for another 3 to 4 days.  Then you can progress to foods described in the pancreatitis eating plan.
